# Patient Record
Sex: Male | Born: 1935 | Race: White | Hispanic: No | Smoking: Former smoker
Health system: Southern US, Community
[De-identification: ages and names within clinical notes are randomized; demographics above are authoritative.]

## PROBLEM LIST (undated history)

## (undated) DIAGNOSIS — Z85828 Personal history of other malignant neoplasm of skin: Secondary | ICD-10-CM

## (undated) DIAGNOSIS — Z860100 Personal history of colon polyps, unspecified: Secondary | ICD-10-CM

## (undated) DIAGNOSIS — M810 Age-related osteoporosis without current pathological fracture: Secondary | ICD-10-CM

## (undated) DIAGNOSIS — J4489 Other specified chronic obstructive pulmonary disease: Secondary | ICD-10-CM

## (undated) DIAGNOSIS — R0600 Dyspnea, unspecified: Secondary | ICD-10-CM

## (undated) DIAGNOSIS — G4700 Insomnia, unspecified: Secondary | ICD-10-CM

## (undated) DIAGNOSIS — I10 Essential (primary) hypertension: Secondary | ICD-10-CM

## (undated) DIAGNOSIS — J449 Chronic obstructive pulmonary disease, unspecified: Secondary | ICD-10-CM

## (undated) DIAGNOSIS — R351 Nocturia: Secondary | ICD-10-CM

## (undated) DIAGNOSIS — C61 Malignant neoplasm of prostate: Secondary | ICD-10-CM

## (undated) DIAGNOSIS — M1990 Unspecified osteoarthritis, unspecified site: Secondary | ICD-10-CM

## (undated) DIAGNOSIS — Z8719 Personal history of other diseases of the digestive system: Secondary | ICD-10-CM

## (undated) DIAGNOSIS — K219 Gastro-esophageal reflux disease without esophagitis: Secondary | ICD-10-CM

## (undated) DIAGNOSIS — J479 Bronchiectasis, uncomplicated: Secondary | ICD-10-CM

## (undated) HISTORY — PX: UPPER GASTROINTESTINAL ENDOSCOPY: SHX188

## (undated) HISTORY — DX: Insomnia, unspecified: G47.00

## (undated) HISTORY — PX: CATARACT EXTRACTION W/ INTRAOCULAR LENS  IMPLANT, BILATERAL: SHX1307

## (undated) HISTORY — DX: Age-related osteoporosis without current pathological fracture: M81.0

## (undated) HISTORY — PX: COLONOSCOPY: SHX174

---

## 2004-09-22 ENCOUNTER — Ambulatory Visit: Payer: Self-pay | Admitting: Internal Medicine

## 2005-03-20 ENCOUNTER — Ambulatory Visit: Payer: Self-pay | Admitting: Internal Medicine

## 2005-05-01 ENCOUNTER — Ambulatory Visit: Payer: Self-pay | Admitting: Internal Medicine

## 2005-12-22 ENCOUNTER — Ambulatory Visit: Payer: Self-pay | Admitting: Gastroenterology

## 2006-01-06 ENCOUNTER — Ambulatory Visit: Payer: Self-pay | Admitting: Gastroenterology

## 2006-01-06 ENCOUNTER — Encounter (INDEPENDENT_AMBULATORY_CARE_PROVIDER_SITE_OTHER): Payer: Self-pay | Admitting: Specialist

## 2006-01-06 ENCOUNTER — Ambulatory Visit (HOSPITAL_COMMUNITY): Admission: RE | Admit: 2006-01-06 | Discharge: 2006-01-06 | Payer: Self-pay | Admitting: Gastroenterology

## 2006-02-15 ENCOUNTER — Ambulatory Visit: Payer: Self-pay | Admitting: Gastroenterology

## 2006-03-31 ENCOUNTER — Ambulatory Visit: Payer: Self-pay | Admitting: Internal Medicine

## 2007-02-03 ENCOUNTER — Ambulatory Visit: Payer: Self-pay | Admitting: Gastroenterology

## 2007-04-13 ENCOUNTER — Ambulatory Visit: Payer: Self-pay | Admitting: Internal Medicine

## 2007-12-12 ENCOUNTER — Ambulatory Visit (HOSPITAL_COMMUNITY): Admission: RE | Admit: 2007-12-12 | Discharge: 2007-12-12 | Payer: Self-pay | Admitting: Urology

## 2007-12-14 ENCOUNTER — Ambulatory Visit: Admission: RE | Admit: 2007-12-14 | Discharge: 2008-03-13 | Payer: Self-pay | Admitting: Radiation Oncology

## 2008-01-06 ENCOUNTER — Ambulatory Visit (HOSPITAL_COMMUNITY): Admission: RE | Admit: 2008-01-06 | Discharge: 2008-01-06 | Payer: Self-pay | Admitting: Urology

## 2008-01-13 ENCOUNTER — Ambulatory Visit: Payer: Self-pay | Admitting: Internal Medicine

## 2008-01-13 ENCOUNTER — Telehealth (INDEPENDENT_AMBULATORY_CARE_PROVIDER_SITE_OTHER): Payer: Self-pay | Admitting: *Deleted

## 2008-01-13 DIAGNOSIS — G4700 Insomnia, unspecified: Secondary | ICD-10-CM

## 2008-01-13 DIAGNOSIS — J441 Chronic obstructive pulmonary disease with (acute) exacerbation: Secondary | ICD-10-CM | POA: Insufficient documentation

## 2008-01-13 DIAGNOSIS — J449 Chronic obstructive pulmonary disease, unspecified: Secondary | ICD-10-CM

## 2008-01-13 DIAGNOSIS — I10 Essential (primary) hypertension: Secondary | ICD-10-CM | POA: Insufficient documentation

## 2008-01-13 DIAGNOSIS — J45909 Unspecified asthma, uncomplicated: Secondary | ICD-10-CM | POA: Insufficient documentation

## 2008-03-21 ENCOUNTER — Ambulatory Visit: Admission: RE | Admit: 2008-03-21 | Discharge: 2008-05-29 | Payer: Self-pay | Admitting: Radiation Oncology

## 2008-04-12 ENCOUNTER — Ambulatory Visit: Payer: Self-pay | Admitting: Internal Medicine

## 2008-04-12 DIAGNOSIS — R0600 Dyspnea, unspecified: Secondary | ICD-10-CM | POA: Insufficient documentation

## 2008-04-12 DIAGNOSIS — R0602 Shortness of breath: Secondary | ICD-10-CM

## 2008-04-27 ENCOUNTER — Telehealth: Payer: Self-pay | Admitting: Internal Medicine

## 2009-02-13 ENCOUNTER — Encounter: Payer: Self-pay | Admitting: Internal Medicine

## 2009-03-20 DIAGNOSIS — Z860100 Personal history of colon polyps, unspecified: Secondary | ICD-10-CM | POA: Insufficient documentation

## 2009-03-20 DIAGNOSIS — Z9189 Other specified personal risk factors, not elsewhere classified: Secondary | ICD-10-CM | POA: Insufficient documentation

## 2009-03-20 DIAGNOSIS — R1013 Epigastric pain: Secondary | ICD-10-CM | POA: Insufficient documentation

## 2009-03-20 DIAGNOSIS — K2940 Chronic atrophic gastritis without bleeding: Secondary | ICD-10-CM | POA: Insufficient documentation

## 2009-03-20 DIAGNOSIS — K219 Gastro-esophageal reflux disease without esophagitis: Secondary | ICD-10-CM

## 2009-03-21 ENCOUNTER — Ambulatory Visit: Payer: Self-pay | Admitting: Gastroenterology

## 2009-04-12 ENCOUNTER — Ambulatory Visit: Payer: Self-pay | Admitting: Internal Medicine

## 2009-07-14 IMAGING — CR DG CHEST 2V
2 series · 2 of 2 positions shown · non-contrast
Comparison: 04/13/2007

CLINICAL DATA: Dyspnea.  COPD.  Chronic bronchitis.  Asthma.

CHEST - 2 VIEW

[view not recorded (1 of 2)]
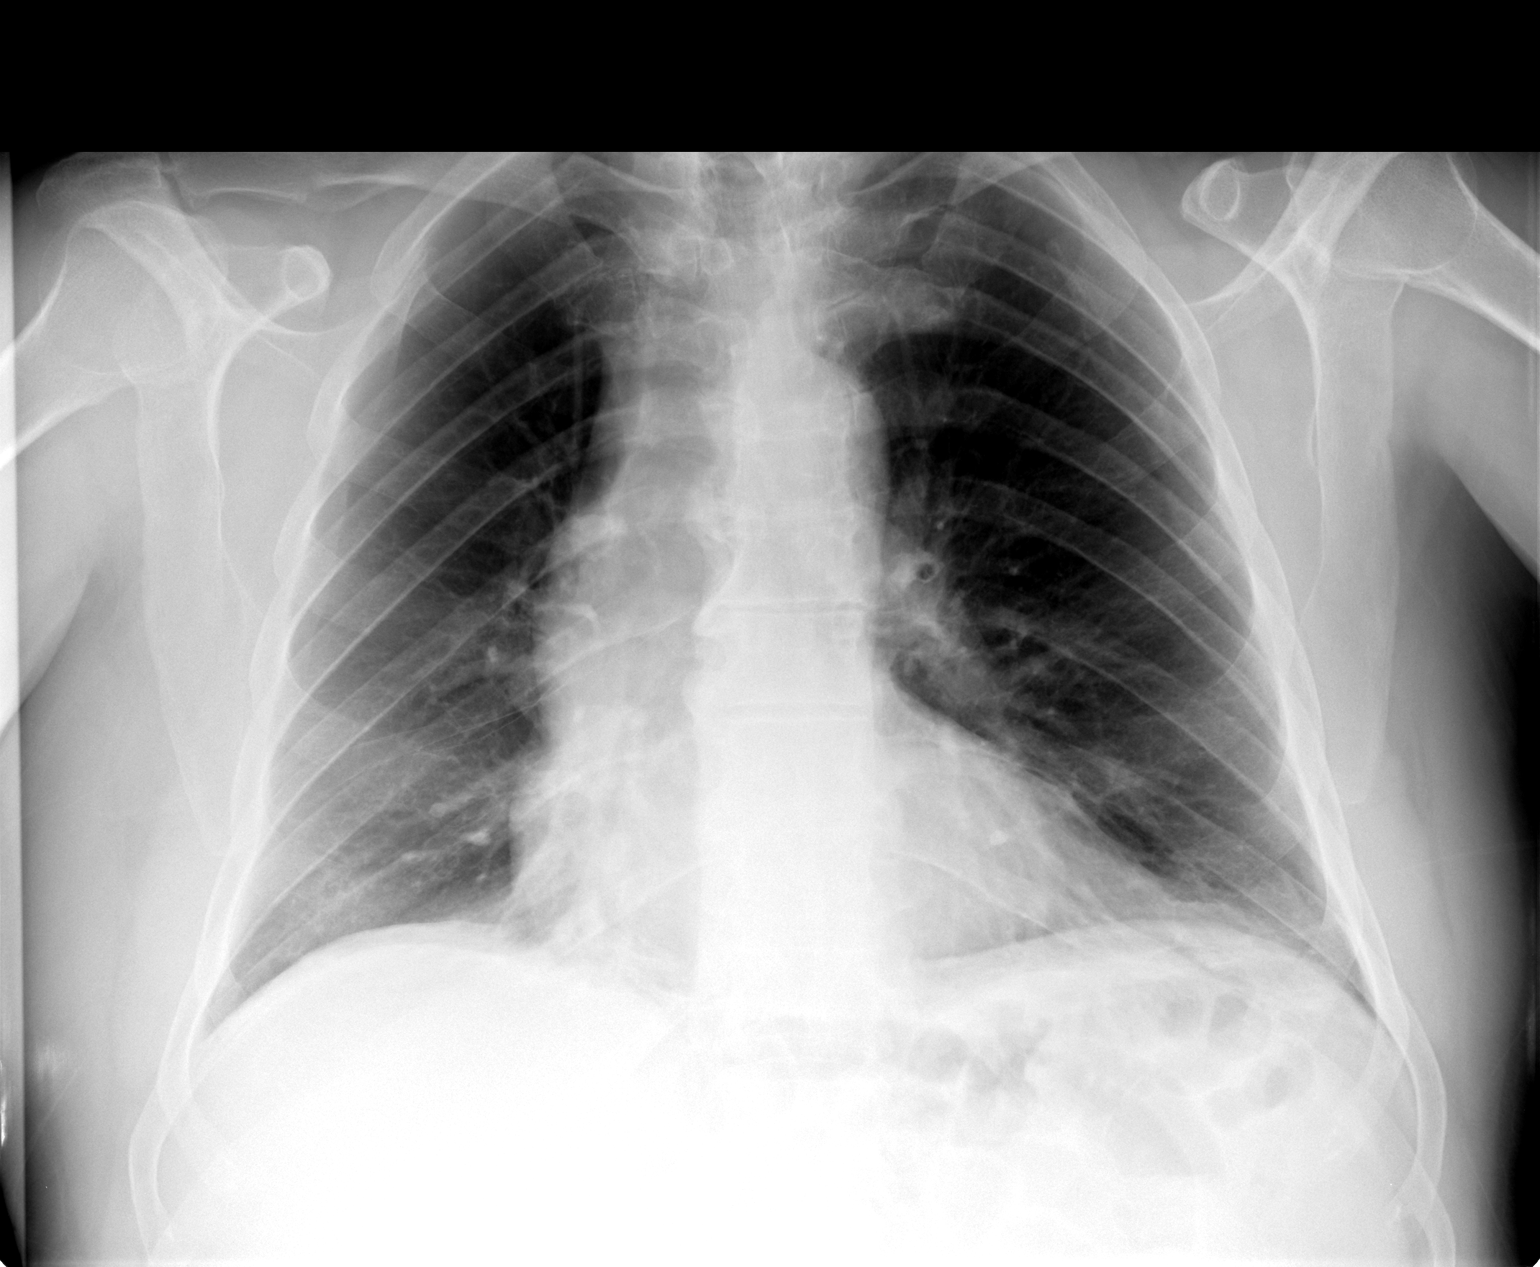

[view not recorded (2 of 2)]
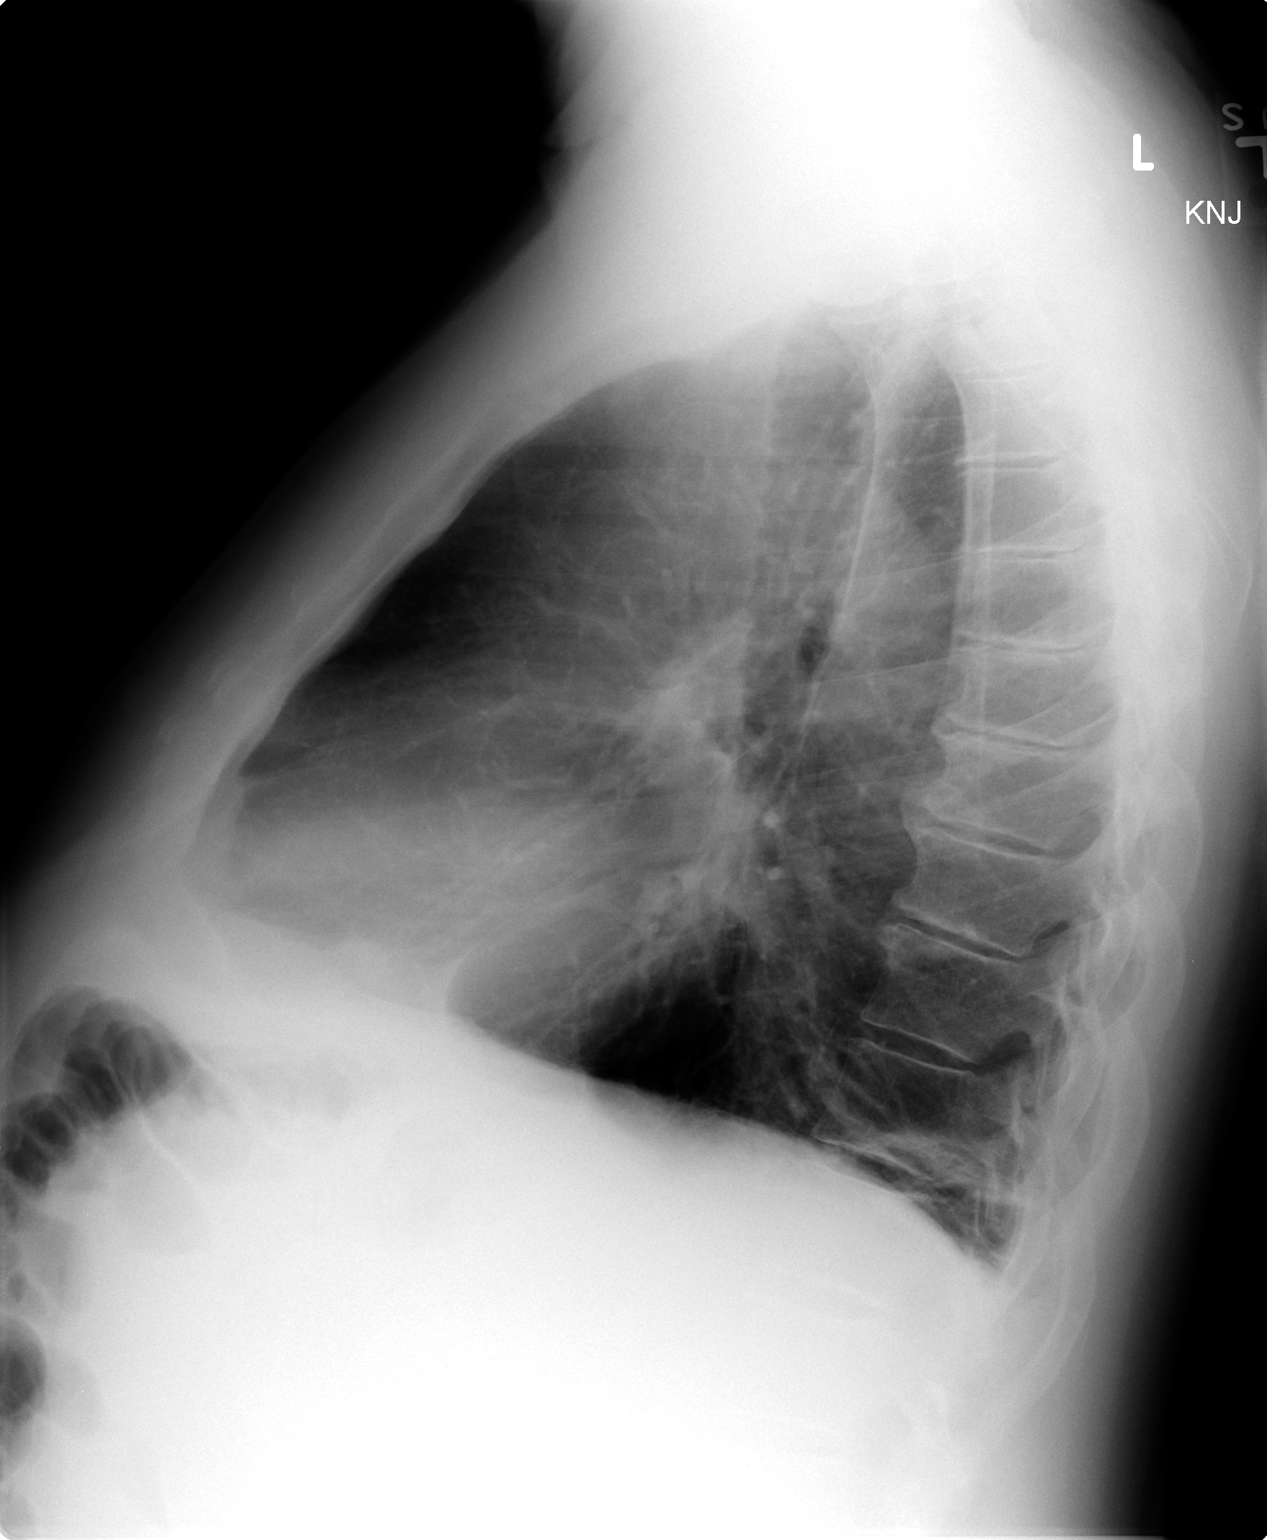

[2 of 2 positions shown; findings below may reference images not displayed]

FINDINGS: Lungs are hyperaerated.  No active infiltrate.  Linear
densities at the left lung base are again appreciated and are
compatible with scarring.  No airspace opacities.  Normal cardiac
size.
IMPRESSION: COPD/emphysema.  Left base scarring.  No acute findings.

## 2009-09-16 ENCOUNTER — Telehealth: Payer: Self-pay | Admitting: Internal Medicine

## 2010-04-11 ENCOUNTER — Ambulatory Visit: Payer: Self-pay | Admitting: Internal Medicine

## 2010-04-11 ENCOUNTER — Encounter: Payer: Self-pay | Admitting: Internal Medicine

## 2010-07-22 NOTE — Assessment & Plan Note (Signed)
Summary: rov 1 yr ///kp   Copy to:  Darvin Neighbours Primary Provider/Referring Provider:  Charm Barges, M.D.  CC:  1 Yearly follow up visit-COPD; Breathing better with cooler weather..  History of Present Illness:  04-24-2008- PFT reviewed: FEV1 2.36/80%, FEV1/FVC 0.66, 25-75 46%. Mild to mod obstructive disease with insignif resp to BD.Mild restriction of TLC 67%, NL DLCO. 93,96,96%, 417 M. Walks 2-4 miles/day. Cough white/yellow, no blood. Varies with weather, no trend. Denies purulent, blood, fever, chill, edema, adenopathy.  24-Apr-2009- Asthma/ COPD, Insomnia Breathing varies with weather but no major change since last here. [facility] Asmanaex. He asks to change from clonazepam for insomnia- tolerance now to 2-3  x 0.5 mg. discussed insomnia. Infrequent need for proventil rescue inhaler. PFT reviewed from last year.  April 11, 2010-  Asthma/ COPD, Insomnia [place] cc: 1 Yearly follow up visit-COPD; Breathing better with cooler weather. Disliked the summer humidity , better now with no acute issue recently, Dicsuccded flu vax.  Easier DOE in past year, a little worse but this has been present for years. [facility] told him "lungs were shot, it's not my heart". Daily cough productive white to light yellow, no blood. Denies chest pain. Some wheeze. Used rescue inhaler 2x daily during summer, now down to every 2-3 days.    Asthma History    Initial Asthma Severity Rating:    Age range: 12+ years    Symptoms: throughout the day    Nighttime Awakenings: 0-2/month    Interferes w/ normal activity: some limitations    SABA use (not for EIB): >2 days/week but not >1X/day    Asthma Severity Assessment: Severe Persistent   Preventive Screening-Counseling & Management  Alcohol-Tobacco     Smoking Status: quit     Packs/Day: 1.0     Year Quit: 1982  Current Medications (verified): 1)  Foradil Aerolizer 12 Mcg  Caps (Formoterol Fumarate) .... Inhale Contents of 1 Capsule Once A  Day 2)  Cozaar 100 Mg  Tabs (Losartan Potassium) .... One By Mouth Once Daily 3)  Clonazepam 0.5 Mg  Tabs (Clonazepam) .... Take 1 Tab By Mouth At Bedtime As Needed 4)  Hydrochlorothiazide 25 Mg  Tabs (Hydrochlorothiazide) .... Take 1 Tablet By Mouth Once A Day 5)  Cvs Omeprazole 20 Mg  Tbec (Omeprazole) .... Take 1 Tablet By Mouth Two Times A Day 6)  Proventil Hfa 108 (90 Base) Mcg/act  Aers (Albuterol Sulfate) .Marland Kitchen.. 1-2 Puffs Every 4 Hours As Needed 7)  Asmanex 60 Metered Doses 220 Mcg/inh Aepb (Mometasone Furoate) .... 2 Puffs and Rinse Every Day 8)  Fosamax 70 Mg Tabs (Alendronate Sodium) .... Once Weekly 9)  Oscal 500/200 D-3 500-200 Mg-Unit Tabs (Calcium-Vitamin D) .... Take 1 Tablet By Mouth Once A Day 10)  Multivitamins   Tabs (Multiple Vitamin) .... Take 1 Tablet By Mouth Once A Day 11)  Red Yeast Rice 600 Mg Caps (Red Yeast Rice Extract) .... Take 2 Tablet By Mouth Once A Day 12)  Aspirin 81 Mg  Tabs (Aspirin) .... 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  Veteran  Risk Factors: Smoking Status: quit (04/11/2010) Packs/Day: 1.0 (04/11/2010)  Past Surgical History: Skin cancer removal from left face  Social History: Packs/Day:  1.0  Review of Systems      See HPI       The patient complains of shortness of breath with activity, productive cough, and non-productive cough.  The patient denies shortness of breath at rest, coughing up blood, chest pain, irregular heartbeats, acid heartburn,  indigestion, loss of appetite, weight change, abdominal pain, difficulty swallowing, sore throat, tooth/dental problems, headaches, nasal congestion/difficulty breathing through nose, sneezing, itching, rash, change in color of mucus, and fever.    Vital Signs:  Patient profile:   75 year old male Height:      70 inches Weight:      188.50 pounds BMI:     27.14 O2 Sat:      96 % on Room air Pulse rate:   75 / minute BP sitting:   126 / 70  (left arm) Cuff size:   regular  Vitals Entered By: Reynaldo Minium CMA (April 11, 2010 9:52 AM)  O2 Flow:  Room air CC: 1 Yearly follow up visit-COPD; Breathing better with cooler weather.   Physical Exam  Additional Exam:  General: A/Ox3; pleasant and cooperative, NAD, overweight SKIN: no rash, lesions NODES: no lymphadenopathy HEENT: [place]/AT, EOM- WNL, Conjuctivae- clear, PERRLA, TM-WNL, Nose- clear, Throat- clear and wnl, Mallampati  II NECK: Supple w/ fair ROM, JVD- none, normal carotid impulses w/o bruits Thyroid- normal to palpation CHEST: Distant w/o wheeze or rhonchi HEART: RRR, no m/g/r heard ABDOMEN: overweight EAV:WUJW, nl pulses, no edema  NEURO: Grossly intact to observation      Impression & Recommendations:  Problem # 1:  COPD (ICD-496) Moderate COPD w/o acute issue. Due for colonoscopy and asks ok for concious sedation. Ok with caution. He will remind GI doctor at the time. I don't think we can do better with meds right now.   Problem # 2:  GASTROESOPHAGEAL REFLUX DISEASE, HX OF (ICD-V12.79) He is fairly well controlled. Does have GI doctor for ongoing care when needed.   Other Orders: Est. Patient Level IV (11914) T-2 View CXR (71020TC) Flu Vaccine 59yrs + MEDICARE PATIENTS (N8295) Administration Flu vaccine - MCR (A2130)  Patient Instructions: 1)  Please schedule a follow-up appointment in 1 year. 2)  Flu vax 3)  Remind your GI doctor at your colonoscopy that he needs to watch your breathing since you have  COPD. 4)  A chest x-ray has been recommended.  Your imaging study may require preauthorization.   Flu Vaccine Consent Questions     Do you have a history of severe allergic reactions to this vaccine? no    Any prior history of allergic reactions to egg and/or gelatin? no    Do you have a sensitivity to the preservative Thimersol? no    Do you have a past history of Guillan-Barre Syndrome? no    Do you currently have an acute febrile illness? no    Have you ever had a severe reaction to latex? no    Vaccine information given and explained to patient? yes    Are you currently pregnant? no    Lot Number:[id number]   Exp Date:12/20/2010   Site Given  Left Deltoid IMu1 Carron Curie CMA  April 11, 2010 10:47 AM

## 2010-07-22 NOTE — Miscellaneous (Signed)
Summary: changing *meds//jwr  Clinical Lists Changes  Medications: Changed medication from * OSCAL 500 MG PLUS D Take 1 tablet by mouth once a day to OSCAL 500/200 D-3 500-200 MG-UNIT TABS (CALCIUM-VITAMIN D) Take 1 tablet by mouth once a day Changed medication from * CENTRUM SILVER MULTIVITAMIN Take 1 tablet by mouth once a day to MULTIVITAMINS   TABS (MULTIPLE VITAMIN) Take 1 tablet by mouth once a day Changed medication from * RED YEAST RICE  1200 MG Take 1 tablet by mouth once a day to RED YEAST RICE 600 MG CAPS (RED YEAST RICE EXTRACT) Take 2 tablet by mouth once a day Changed medication from * ASA  81 MG Take 1 tablet by mouth once a day to ASPIRIN 81 MG  TABS (ASPIRIN) Take 1 tablet by mouth once a day

## 2010-07-22 NOTE — Progress Notes (Signed)
Summary: rx refill  Phone Note Call from Patient   Caller: Patient Call For: young Summary of Call: pt requests sleep meds refilled. says [facility] [place] pt # [id number] Initial call taken by: Tivis Ringer, CNA,  September 16, 2009 3:25 PM  Follow-up for Phone Call        per margaret, the rx request from the pharmacy was faxed and given to triage (by Dorann Lodge) after fax was received today (about 15 mins ago). Tivis Ringer, CNA  September 16, 2009 3:45 PM  gave refill request to CY to sign per TD. Carron Curie CMA  September 16, 2009 5:20 PM   Additional Follow-up for Phone Call Additional follow up Details #1::        Please let pt know that Rx was faxed last night to pharmacy.Reynaldo Minium CMA  September 17, 2009 8:50 AM   pt aware rx sent.Carron Curie CMA  September 17, 2009 8:58 AM     Prescriptions: CLONAZEPAM 0.5 MG  TABS (CLONAZEPAM) Take 1 tab by mouth at bedtime as needed  #30 x 5   Entered by:   Carron Curie CMA   Authorized by:   Waymon Budge MD   Signed by:   Carron Curie CMA on 09/17/2009   Method used:   Telephoned to ...       [facility] (retail)       [address]       [place], [place]  [address]       Ph: [phone]       Fax: [phone]   RxID:   [id number]

## 2010-11-04 NOTE — Assessment & Plan Note (Signed)
[facility]                             PULMONARY OFFICE NOTE   NAME:[name], [name]                        MRN:          [id number]  DATE:04/13/2007                            DOB:          [birth year]    PULMONARY FOLLOWUP   PROBLEM:  1. Asthma with COPD.  2. Hypertension.  3. Insomnia.   HISTORY:  A 1-year followup.  He notices persistent mild chest  congestion, often coughing up yellow or green, otherwise white mucus.  No blood.  No chest pain.  He thinks he may have gotten a little more  short of breath over the past year, especially with exertion.  He gets  much of his medication from the [place].  Currently, he is using Foradil, but  dropped off Pulmicort and has no steroid inhaler.  We discussed concerns  about long-term use of long-acting beta adrenergic drugs without a  steroid inhaler.   OBJECTIVE:  Weight 195 pounds, BP 142/54, pulse 70, room air saturation  95%.  Minimal rhonchi.  Heart sounds are regular without murmur.  Breathing is unlabored.  I find no adenopathy and no edema.   IMPRESSION:  Chronic obstructive pulmonary disease with chronic  bronchitis.   PLAN:  1. Flu vaccine discussed and given.  2. Chest x-ray.  3. Restart Pulmicort Flexhaler 2 puffs b.i.d., which I think is      currently on the [place] formulary.  Schedule return in 1 year, earlier      p.r.n.     Clinton D. Maple Hudson, MD, Tonny Bollman, FACP  Electronically Signed    CDY/MedQ  DD: 04/13/2007  DT: 04/14/2007  Job #: [id number]   cc:   Samuel Jester

## 2010-11-04 NOTE — Assessment & Plan Note (Signed)
NAMEEARSEL, SHOUSE                  CHART#:  [id number]   DATE:  02/03/2007                       DOB:  1936/05/29   PROBLEM LIST:  1. Adenomatous colon polyp.  2. Epigastric and chest pain likely secondary to NSAID gastritis.  3. Hypertension.  4. Skin cancer.   SUBJECTIVE:  Mr. Urbas is a 75 year old male who presents as a return  patient visit.  He has no particular questions, concerns or complaints.  He denies any problems swallowing, nausea, vomiting.  He has had  intentional 6 to 8 pound weight loss.  He continues to take his  Omeprazole once daily.  He gets his Omeprazole through the [place].   OBJECTIVE:  VITAL SIGNS:  Weight 196 pounds, height 5 feet 10 inches,  temperature is 98.2, blood pressure 158/80, pulse 78.  GENERAL:  He is in no apparent distress, alert and oriented times 4.  LUNGS:  Clear to auscultation bilaterally.  CARDIOVASCULAR:  Regular rhythm.  No murmur.  ABDOMEN:  Bowel sounds present, soft, nontender, nondistended.   ASSESSMENT:  Mr. Spraggins is a 75 year old male with nonsteroidal  antiinflammatory drug gastritis.  His symptoms have resolved. Thank you  for allowing me to see Mr. Heady in consultation.  My recommendations  are as follows.   RECOMMENDATIONS:  1. He may continue the Omeprazole.  2. He is doing so well he may return to see me in 2010.  3. Screening colonoscopy in 2012.       Kassie Mends, M.D.  Electronically Signed     SM/MEDQ  D:  02/03/2007  T:  02/04/2007  Job:  [id number]   cc:   Samuel Jester

## 2010-11-07 NOTE — Consult Note (Signed)
NAMEGAIUS, ISHAQ NO.:  [id number]   MEDICAL RECORD NO.:  [id number]           PATIENT TYPE:   LOCATION:                                 FACILITY:   PHYSICIAN:  Kassie Mends, M.D.           DATE OF BIRTH:   DATE OF CONSULTATION:  DATE OF DISCHARGE:                                   CONSULTATION   Dear Dr. Charm Barges:   I am seeing [name] as a new patient in consultation per your request.  I  am seeing him for uncontrolled reflux disease.   [name] is a 75 year old male who has had acid problems for 20-30 years.  He has a burning sensation in his upper abdomen that radiates into his chest  daily.  His symptoms have been uncontrolled since September 2006.  It  started with a lot of gas and burping.  He says his throat stays raw.  He  has occasional pain in his lower abdomen and it is relieved by belching and  bowel movements.  His last episode was Friday.  He reports only eating 1  meal a day.  He denies any soda consumption, chewing gum, or use of  cigarettes.  He occasionally has difficulty swallowing water.  He has no  change in his voice.  He denies any weight loss, constipation, or blood in  his stool.  He occasionally uses Advil.  He does consume alcohol.  He has no  triggers to his burning sensation in his abdomen and chest.  It is unrelated  to foods.  He say eating initially makes it better and then after he  finishes digesting, it is worse.  He has had no significant improvement with  different proton pump inhibitors such as Prevacid and Protonix.  He has  never had a colonoscopy, and he denies nausea and vomiting.  He does not  have any diarrhea.  He says the symptoms are somewhat better but he is not  sure whether the omeprazole is the reason why.   His past medical history includes:  1.  Chronic.  2.  Hypertension.  3.  He has had a skin cancer removed from his face.   He is not allergic to any medicines.  His medication list includes  Losartan,  omeprazole, levalbuterol, clonazepam, Centrum, Silver, red yeast rice,  garlic, and Advil.  He has no family history of colon cancer or colon polyps.  His mother did  have intestinal gangrene at age 2 and died.  He is not married and stopped  smoking in 1982.  His review of systems is per the HPI, otherwise all systems are negative.   On physical exam, he is afebrile and hemodynamically stable. GENERAL: NAD, A  & O X 4. heent: AT/[place], PERRL, mouth no oral lesions, posterior pharynx  without erythema or exudate. NECK: no lymphadenopathy, FROM. LUNGS: clear to  auscultation. CARDIOVASCULAR: RR, no mumur. ABDOMEN: bowel sounds present,  no TTP, rebound, nor guarding, no hepatosplenomegaly. EXTREMITIES: no  cyanosis, clubbling, or edema. NEURO: no focal deificts.  [name] is a 75 year old male with uncontrolled reflux.  He has lower  abdominal pain and is average risk for colorectal cancer occurrence.  The  differential diagnoses for the burnig sensation in his abdomen includes:  reflux esophagitis, nonerosive reflux esophageal disease, gastritis from  alcohol and NSAIDs, peptic ulcer disease, and a low likelihood of  malignancy.  His lower abdominal pain is not problematic at this time.  Thank you for allowing me to see [name] in consultation.   My list of recommendations follows:  1.  I recommend upper endoscopy to evaluate persistent burning pain in his      chest and abdomen.  I will biopsy any abnormal areas.  2.  He will also have a colonoscopy performed for average risk colon cancer      screening.  3.  He may follow up with me as needed.   Please feel free to contact me at [phone] if you have additional  questions.   Sincerely,      Kassie Mends, M.D.  Electronically Signed     SM/MEDQ  D:  12/22/2005  T:  12/22/2005  Job:  [id number]

## 2010-11-07 NOTE — Op Note (Signed)
[name], [name]                 ACCOUNT NO.:  [id number]   MEDICAL RECORD NO.:  [id number]          PATIENT TYPE:  AMB   LOCATION:  DAY                           FACILITY:  [facility]   PHYSICIAN:  Kassie Mends, M.D.      DATE OF BIRTH:  05/14/1936   DATE OF PROCEDURE:  01/06/2006  DATE OF DISCHARGE:                                 OPERATIVE REPORT   PROCEDURE:  Esophagogastroduodenoscopy with cold forceps biopsy.   MEDICATIONS:  1.  Demerol 50 mg IV.  2.  Versed 4 mg IV.   INDICATIONS FOR EXAM:  [name] is a 75 year old male who presented with  acid problems for 20 to 30 years.  He had a burning sensation in his upper  abdomen.  He believes this is his reflux disease.   FINDINGS:  1.  Normal esophagus without evidence of reflux esophagitis or Barrett's      esophagus.  2.  Mild antral erythema.  3.  Otherwise normal pylorus, duodenal bulb, and duodenum.   RECOMMENDATIONS:  1.  No aspirin, NSAIDs or anticoagulation for three days.  2.  Will follow up biopsies.  3.  No source identified for burning sensation in abdomen, which is likely      due to nonulcer dyspepsia or nonerosive reflux esophageal disease.  I      will await the results of the biopsies and decide what the next best      step will be.  4.  Follow up with Dr. Samuel Jester.   PROCEDURE TECHNIQUE:  A physical exam was performed and informed consent was  obtained from the patient after explaining all risks (perforation, bleeding,  infection, adverse effects to medicines), benefits and alternatives to the  procedure which the patient appeared to understand and so stated.  The  patient was connected to the monitoring device and placed in the left  lateral position.  Continuous oxygen was provided by nasal cannula and IV  medicine administered through an indwelling cannula.  After administration  of sedation, the patient's esophagus was intubated  and the scope was advanced under direct visualization to the second  portion  of the duodenum.  The scope was subsequently removed slowly by carefully  examining the color, texture, anatomy, and mucosa on the way out.  The  patient was recovered in the endoscopy suite and discharged to home in  satisfactory condition.      Kassie Mends, M.D.  Electronically Signed     SM/MEDQ  D:  01/06/2006  T:  01/07/2006  Job:  [id number]   cc:   Samuel Jester  Fax: [phone]

## 2010-11-07 NOTE — Op Note (Signed)
[name], HY                 ACCOUNT NO.:  [id number]   MEDICAL RECORD NO.:  [id number]          PATIENT TYPE:  AMB   LOCATION:  DAY                           FACILITY:  [facility]   PHYSICIAN:  Kassie Mends, M.D.      DATE OF BIRTH:  03-10-1936   DATE OF PROCEDURE:  01/06/2006  DATE OF DISCHARGE:                                 OPERATIVE REPORT   PROCEDURE:  Colonoscopy with snare cautery and cold forceps biopsy.   MEDICATIONS:  1.  Demerol 25 mg IV.  2.  Versed 1 mg IV.   INDICATIONS FOR EXAM:  Mr. [name] is a 75 year old male who presents for  average-risk colon cancer screening.   FINDINGS:  1.  Multiple colon and rectal polyps removed via cold forceps biopsy, cold      snare, and snare cautery.  2.  Sigmoid diverticulosis  3.  Otherwise no inflammatory changes, masses, or vascular ectasia seen.  No      internal hemorrhoids.   RECOMMENDATIONS:  1.  No aspirin, NSAIDs, or anticoagulation for 5-7 days.  2.  Will follow up biopsies.  3.  If polyps are adenomatous, he will need a screening colonoscopy in 5      years.  4.  Follow up with Dr. Samuel Jester.   PROCEDURE TECHNIQUE:  A physical exam was performed and an informed consent  was obtained from the patient after explaining all risks, benefits, and  alternatives to the procedure which the patient appeared to understand and  so stated.  The patient was connected to the monitoring device and placed in  the left lateral position.  Continuous oxygen was provided by nasal cannula  and IV medicines administered through an indwelling cannula.  After  administration of sedation and rectal exam, the patient's rectum was  intubated; and the scope was advanced, under direct visualization, to the  cecum.  Multiple 4-to-6-mm sessile polyps in the colon and the rectum were  removed using cold biopsy technique and cautery with the Erbie at standard  settings (25W).  The scope was subsequently removed slowly by carefully  examining  the color, texture, and anatomy of the mucosa on the way out.  The  patient was recovered in the endoscopy suite and discharged to home in  satisfactory condition.      Kassie Mends, M.D.  Electronically Signed     SM/MEDQ  D:  01/06/2006  T:  01/07/2006  Job:  [id number]   cc:   Samuel Jester  Fax: [phone]

## 2010-12-25 ENCOUNTER — Encounter: Payer: Self-pay | Admitting: Gastroenterology

## 2011-02-05 ENCOUNTER — Ambulatory Visit: Payer: Self-pay | Admitting: Gastroenterology

## 2011-02-25 ENCOUNTER — Encounter: Payer: Self-pay | Admitting: Gastroenterology

## 2011-02-25 ENCOUNTER — Ambulatory Visit (INDEPENDENT_AMBULATORY_CARE_PROVIDER_SITE_OTHER): Payer: Medicare Other | Admitting: Gastroenterology

## 2011-02-25 VITALS — BP 113/61 | HR 94 | Temp 97.6°F | Ht 69.0 in | Wt 184.2 lb

## 2011-02-25 DIAGNOSIS — D126 Benign neoplasm of colon, unspecified: Secondary | ICD-10-CM

## 2011-02-25 DIAGNOSIS — K635 Polyp of colon: Secondary | ICD-10-CM

## 2011-02-25 MED ORDER — ALENDRONATE SODIUM 70 MG PO TABS: 70.0000 mg | ORAL_TABLET | ORAL | Status: DC

## 2011-02-25 NOTE — Progress Notes (Signed)
Subjective:    Patient ID: [name], male    DOB: 23-Nov-1935, 75 y.o.   MRN: [id number]  [facility] [facility] [place] ([place]. Valene Bors)  HPI Having problems with sore throat/PND. Problems being put to sleep due to chronic bronchitis. Treated to Prostate CA-took XRT because he couldn't have surgery. No problems with sedation. No BRBPR,  or melena. Rare problem with swallowing-w/u at [facility] in 1984. Loss weight due to sore throat. 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 ON SYMMBICORT SINCE MAY 2012 AND ON/OFF STEROIDS SINCE 1984. Calcium 2 daily and VIT D ONCE A WEEK, AND FOSAMAX. APPETITE: SO SO.  Past Medical History  Diagnosis Date  . Insomnia   . HTN (hypertension)   . COPD (chronic obstructive pulmonary disease)   . Asthma   . Prostate ca   . Skin cancer of face   . NSAID-associated gastropathy 2007 ADVIL    Past Surgical History  Procedure Date  . Esophagogastroduodenoscopy 12/2005  . Skin cancer excision     from left  face  . Upper gastrointestinal endoscopy 2007 DYSPEPSIA D50 V4    NSAID GASTRITIS  . Colonoscopy JUL 2007 D25 V1    Simple adenomas(RECTUM), HYPERPLASTIC COLON POLYPS, [place] TICS    No Known Allergies  Current Outpatient Prescriptions  Medication Sig Dispense Refill  . albuterol (PROVENTIL HFA;VENTOLIN HFA) 108 (90 BASE) MCG/ACT inhaler Inhale 2 puffs into the lungs every 6 (six) hours as needed.        Marland Kitchen aspirin 81 MG tablet Take 81 mg by mouth daily.        . budesonide-formoterol (SYMBICORT) 160-4.5 MCG/ACT inhaler Inhale 2 puffs into the lungs 2 (two) times daily.        . calcium-vitamin D (OSCAL WITH D) 500-200 MG-UNIT per tablet Take 1 tablet by mouth daily.        Marland Kitchen doxazosin (CARDURA) 4 MG tablet Take 4 mg by mouth at bedtime.       . ergocalciferol (VITAMIN D2) 50000 UNITS capsule Take 50,000 Units by mouth once a week.        . hydrochlorothiazide 25 MG tablet Take 25 mg by mouth daily.        Marland Kitchen losartan  (COZAAR) 100 MG tablet Take 100 mg by mouth daily.        . mometasone (ASMANEX) 220 MCG/INH inhaler Inhale 2 puffs into the lungs daily.        . Multiple Vitamin (MULTIVITAMIN) capsule Take 1 capsule by mouth daily.        Marland Kitchen omeprazole (PRILOSEC) 20 MG capsule Take 20 mg by mouth daily.        . Red Yeast Rice 600 MG CAPS Take by mouth.        . zolpidem (AMBIEN) 10 MG tablet Take 10 mg by mouth at bedtime as needed.        Marland Kitchen alendronate (FOSAMAX) 70 MG tablet Take 1 tablet (70 mg total) by mouth every 7 (seven) days. Take with a full glass of water on an empty stomach.  4 tablet  5    Family History  Problem Relation Age of Onset  . Prostate cancer Father   . Colon cancer Neg Hx   . Colon polyps Neg Hx     History   Social History  . Marital Status: Divorced   . Smoking status: Former Smoker -- 1.5 packs/day    Types: Cigarettes  . Smokeless tobacco: Not  on file   Comment: quit about 30 yrs ago( 1982)  . Alcohol Use: Yes     former user   Review of Systems  All other systems reviewed and are negative.       Objective:   Physical Exam  Constitutional: He is oriented to person, place, and time. He appears well-developed and well-nourished. No distress.  HENT:  Head: Normocephalic and atraumatic.  Mouth/Throat: Oropharynx is clear and moist.  Eyes: Pupils are equal, round, and reactive to light.  Neck: Normal range of motion. Neck supple.  Cardiovascular: Normal rate, regular rhythm and normal heart sounds.   Pulmonary/Chest: Effort normal and breath sounds normal.  Abdominal: Bowel sounds are normal. He exhibits no distension. There is no tenderness.  Musculoskeletal: Normal range of motion. He exhibits no edema.  Lymphadenopathy:    He has no cervical adenopathy.  Neurological: He is alert and oriented to person, place, and time.       NO FOCAL DEFICITS  Psychiatric: He has a normal mood and affect.          Assessment & Plan:

## 2011-02-26 ENCOUNTER — Encounter: Payer: Self-pay | Admitting: Gastroenterology

## 2011-02-26 DIAGNOSIS — K635 Polyp of colon: Secondary | ICD-10-CM | POA: Insufficient documentation

## 2011-02-26 NOTE — Progress Notes (Signed)
Cc to PCP 

## 2011-02-26 NOTE — Assessment & Plan Note (Signed)
TCS IN 2013. IF SIMPLE ADENOMAS, then will extend TCS interval to 10-15 years. MOVIPREP. DO NOT TAKE FOSAMAX ON THE DAY PRIOR TO OR DAY OF TCS. OPV PRN.

## 2011-03-06 NOTE — Progress Notes (Signed)
TCS REMINDER PLACED IN EPIC

## 2011-03-18 ENCOUNTER — Encounter (HOSPITAL_COMMUNITY)
Admission: RE | Disposition: A | Discharge status: Home / Self Care | Payer: Self-pay | Source: Ambulatory Visit | Attending: Gastroenterology

## 2011-03-18 ENCOUNTER — Ambulatory Visit (HOSPITAL_COMMUNITY)
Admission: RE | Admit: 2011-03-18 | Discharge: 2011-03-18 | Disposition: A | Discharge status: Home / Self Care | Payer: Medicare Other | Source: Ambulatory Visit | Attending: Gastroenterology | Admitting: Gastroenterology

## 2011-03-18 ENCOUNTER — Encounter (HOSPITAL_COMMUNITY): Payer: Self-pay | Admitting: *Deleted

## 2011-03-18 ENCOUNTER — Other Ambulatory Visit: Payer: Self-pay | Admitting: Gastroenterology

## 2011-03-18 DIAGNOSIS — Z860100 Personal history of colon polyps, unspecified: Secondary | ICD-10-CM | POA: Insufficient documentation

## 2011-03-18 DIAGNOSIS — Z7982 Long term (current) use of aspirin: Secondary | ICD-10-CM | POA: Insufficient documentation

## 2011-03-18 DIAGNOSIS — K635 Polyp of colon: Secondary | ICD-10-CM

## 2011-03-18 DIAGNOSIS — I10 Essential (primary) hypertension: Secondary | ICD-10-CM | POA: Insufficient documentation

## 2011-03-18 DIAGNOSIS — D126 Benign neoplasm of colon, unspecified: Secondary | ICD-10-CM | POA: Insufficient documentation

## 2011-03-18 DIAGNOSIS — J449 Chronic obstructive pulmonary disease, unspecified: Secondary | ICD-10-CM | POA: Insufficient documentation

## 2011-03-18 DIAGNOSIS — K648 Other hemorrhoids: Secondary | ICD-10-CM | POA: Insufficient documentation

## 2011-03-18 DIAGNOSIS — J4489 Other specified chronic obstructive pulmonary disease: Secondary | ICD-10-CM | POA: Insufficient documentation

## 2011-03-18 DIAGNOSIS — K5730 Diverticulosis of large intestine without perforation or abscess without bleeding: Secondary | ICD-10-CM | POA: Insufficient documentation

## 2011-03-18 DIAGNOSIS — Z1211 Encounter for screening for malignant neoplasm of colon: Secondary | ICD-10-CM

## 2011-03-18 DIAGNOSIS — Z09 Encounter for follow-up examination after completed treatment for conditions other than malignant neoplasm: Secondary | ICD-10-CM | POA: Insufficient documentation

## 2011-03-18 DIAGNOSIS — Z79899 Other long term (current) drug therapy: Secondary | ICD-10-CM | POA: Insufficient documentation

## 2011-03-18 HISTORY — PX: COLONOSCOPY: SHX5424

## 2011-03-18 SURGERY — COLONOSCOPY
Anesthesia: Moderate Sedation

## 2011-03-18 MED ORDER — SODIUM CHLORIDE 0.45 % IV SOLN
Freq: Once | INTRAVENOUS | Status: AC
  Administered 2011-03-18: 08:00:00 via INTRAVENOUS

## 2011-03-18 MED ORDER — STERILE WATER FOR IRRIGATION IR SOLN
Status: DC | PRN
  Administered 2011-03-18: 08:00:00

## 2011-03-18 MED ORDER — MEPERIDINE HCL 100 MG/ML IJ SOLN
INTRAMUSCULAR | Status: DC | PRN
  Administered 2011-03-18: 25 mg via INTRAVENOUS

## 2011-03-18 MED ORDER — MIDAZOLAM HCL 5 MG/5ML IJ SOLN
INTRAMUSCULAR | Status: AC
  Filled 2011-03-18: qty 5

## 2011-03-18 MED ORDER — MEPERIDINE HCL 100 MG/ML IJ SOLN
INTRAMUSCULAR | Status: AC
  Filled 2011-03-18: qty 1

## 2011-03-18 MED ORDER — MIDAZOLAM HCL 5 MG/5ML IJ SOLN
INTRAMUSCULAR | Status: DC | PRN
  Administered 2011-03-18: 2 mg via INTRAVENOUS

## 2011-03-18 NOTE — Interval H&P Note (Signed)
History and Physical Interval Note:   03/18/2011   8:11 AM   [name]  has presented today for surgery, with the diagnosis of colon polyps  The various methods of treatment have been discussed with the patient and family. After consideration of risks, benefits and other options for treatment, the patient has consented to  Procedure(s): COLONOSCOPY as a surgical intervention .  I have reviewed the patients' chart and labs.  Questions were answered to the patient's satisfaction.     Jonette Eva  MD

## 2011-03-18 NOTE — H&P (Signed)
[name], male    DOB: 1935-09-15, 75 y.o.   MRN: [id number]   [facility] [place]. Valene Bors[place]   HPI Having problems with sore throat/PND. Problems being put to sleep due to chronic bronchitis. Treated to Prostate CA-took XRT because he couldn't have surgery. No problems with sedation. No BRBPR,  or melena. Rare problem with swallowing-w/u at [facility] in 1984. Loss weight due to sore throat. 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 ON SYMMBICORT SINCE MAY 2012 AND ON/OFF STEROIDS SINCE 1984. Calcium 2 daily and VIT D ONCE A WEEK, AND FOSAMAX. APPETITE: SO SO.    Past Medical History   Diagnosis  Date   .  Insomnia     .  HTN (hypertension)     .  COPD (chronic obstructive pulmonary disease)     .  Asthma     .  Prostate ca     .  Skin cancer of face     .  NSAID-associated gastropathy  2007 AD[facility]       Past Surgical History   Procedure  Date   .  Esophagogastroduodenoscopy  12/2005   .  Skin cancer excision         from left  face   .  Upper gastrointestinal endoscopy  2007 DYSPEPSIA D50 V4       NSAID GASTRITIS   .  Colonoscopy  JUL 2007 D25 V1       Simple adenomas(RECTUM), HYPERPLASTIC COLON POLYPS, [place] TICS      No Known Allergies    Current Outpatient Prescriptions   Medication  Sig  Dispense  Refill   .  albuterol (PROVENTIL HFA;VENTOLIN HFA) 108 (90 BASE) MCG/ACT inhaler  Inhale 2 puffs into the lungs every 6 (six) hours as needed.           Marland Kitchen  aspirin 81 MG tablet  Take 81 mg by mouth daily.           .  budesonide-formoterol (SYMBICORT) 160-4.5 MCG/ACT inhaler  Inhale 2 puffs into the lungs 2 (two) times daily.           .  calcium-vitamin D (OSCAL WITH D) 500-200 MG-UNIT per tablet  Take 1 tablet by mouth daily.           Marland Kitchen  doxazosin (CARDURA) 4 MG tablet  Take 4 mg by mouth at bedtime.          .  ergocalciferol (VITAMIN D2) 50000 UNITS capsule  Take 50,000 Units by mouth once a week.           .   hydrochlorothiazide 25 MG tablet  Take 25 mg by mouth daily.           Marland Kitchen  losartan (COZAAR) 100 MG tablet  Take 100 mg by mouth daily.           .  mometasone (ASMANEX) 220 MCG/INH inhaler  Inhale 2 puffs into the lungs daily.           .  Multiple Vitamin (MULTIVITAMIN) capsule  Take 1 capsule by mouth daily.           Marland Kitchen  omeprazole (PRILOSEC) 20 MG capsule  Take 20 mg by mouth daily.           .  Red Yeast Rice 600 MG CAPS  Take by mouth.           .  zolpidem (  AMBIEN) 10 MG tablet  Take 10 mg by mouth at bedtime as needed.           Marland Kitchen  alendronate (FOSAMAX) 70 MG tablet  Take 1 tablet (70 mg total) by mouth every 7 (seven) days. Take with a full glass of water on an empty stomach.   4 tablet   5       Family History   Problem  Relation  Age of Onset   .  Prostate cancer  Father     .  Colon cancer  Neg Hx     .  Colon polyps  Neg Hx         History       Social History   .  Marital Status:  Divorced       .  Smoking status:  Former Smoker -- 1.5 packs/day       Types:  Cigarettes   .  Smokeless tobacco:  Not on file     Comment: quit about 30 yrs ago( 1982)   .  Alcohol Use:  Yes         former user      Review of Systems  All other systems reviewed and are negative.     Objective:    Physical Exam  Constitutional: He is oriented to person, place, and time. He appears well-developed and well-nourished. No distress.  HENT:   Head: Normocephalic and atraumatic.   Mouth/Throat: Oropharynx is clear and moist.  Eyes: Pupils are equal, round, and reactive to light.  Neck: Normal range of motion. Neck supple.  Cardiovascular: Normal rate, regular rhythm and normal heart sounds.   Pulmonary/Chest: Effort normal and breath sounds normal.  Abdominal: Bowel sounds are normal. He exhibits no distension. There is no tenderness.  Musculoskeletal: Normal range of motion. He exhibits no edema.  Lymphadenopathy:    He has no cervical adenopathy.  Neurological: He is alert and  oriented to person, place, and time.       NO FOCAL DEFICITS  Psychiatric: He has a normal mood and affect.   Assessment & Plan:     Colon polyps - Jonette Eva, MD  02/26/2011 10:57 AM  Signed TCS IN 2012. IF SIMPLE ADENOMAS, then will extend TCS interval to 10-15 years. MOVIPREP. DO NOT TAKE FOSAMAX ON THE DAY PRIOR TO OR DAY OF TCS. OPV PRN.

## 2011-03-20 LAB — CBC
HCT: 44.4
MCHC: 34.1
MCV: 94.6
RBC: 4.7

## 2011-03-20 LAB — COMPREHENSIVE METABOLIC PANEL
BUN: 12
CO2: 32
Calcium: 9.9
Creatinine, Ser: 0.97
GFR calc Af Amer: >60
GFR calc non Af Amer: >60
Glucose, Bld: 98
Total Bilirubin: 1.2

## 2011-03-20 LAB — PROTIME-INR
INR: 1
Prothrombin Time: 13.1

## 2011-03-20 LAB — APTT: aPTT: 29

## 2011-03-25 ENCOUNTER — Encounter (HOSPITAL_COMMUNITY): Payer: Self-pay | Admitting: Gastroenterology

## 2011-03-25 ENCOUNTER — Telehealth: Payer: Self-pay | Admitting: Gastroenterology

## 2011-03-25 NOTE — Telephone Encounter (Signed)
Please call pt. He had A simple adenoma removed from her colon. TCS in 10-15 years if the benefits outweigh the risks. High fiber diet.

## 2011-03-26 NOTE — Telephone Encounter (Signed)
Results Cc to PCP and 10-15 yr reminder is nicd in the computer

## 2011-03-26 NOTE — Telephone Encounter (Signed)
Reminder in epic to have tcs in 10-15 years °

## 2011-03-26 NOTE — Telephone Encounter (Signed)
LMOM to call.

## 2011-03-26 NOTE — Telephone Encounter (Signed)
Pt informed of results.

## 2011-04-09 ENCOUNTER — Ambulatory Visit (INDEPENDENT_AMBULATORY_CARE_PROVIDER_SITE_OTHER): Payer: Medicare Other | Admitting: Internal Medicine

## 2011-04-09 ENCOUNTER — Encounter: Payer: Self-pay | Admitting: Internal Medicine

## 2011-04-09 ENCOUNTER — Ambulatory Visit (INDEPENDENT_AMBULATORY_CARE_PROVIDER_SITE_OTHER)
Admission: RE | Admit: 2011-04-09 | Discharge: 2011-04-09 | Disposition: A | Discharge status: Home / Self Care | Payer: Medicare Other | Source: Ambulatory Visit | Attending: Internal Medicine | Admitting: Internal Medicine

## 2011-04-09 ENCOUNTER — Other Ambulatory Visit (INDEPENDENT_AMBULATORY_CARE_PROVIDER_SITE_OTHER): Payer: Medicare Other

## 2011-04-09 VITALS — BP 132/70 | HR 92 | Ht 70.0 in | Wt 182.4 lb

## 2011-04-09 DIAGNOSIS — IMO0001 Reserved for inherently not codable concepts without codable children: Secondary | ICD-10-CM

## 2011-04-09 DIAGNOSIS — J4489 Other specified chronic obstructive pulmonary disease: Secondary | ICD-10-CM

## 2011-04-09 DIAGNOSIS — J449 Chronic obstructive pulmonary disease, unspecified: Secondary | ICD-10-CM

## 2011-04-09 DIAGNOSIS — J00 Acute nasopharyngitis [common cold]: Secondary | ICD-10-CM

## 2011-04-09 LAB — CBC WITH DIFFERENTIAL/PLATELET
Basophils Absolute: 0 10*3/uL (ref 0.0–0.1)
Eosinophils Relative: 2.6 % (ref 0.0–5.0)
Monocytes Relative: 6.9 % (ref 3.0–12.0)
Neutrophils Relative %: 77.2 % — ABNORMAL HIGH (ref 43.0–77.0)
Platelets: 210 10*3/uL (ref 150.0–400.0)
RDW: 13.1 % (ref 11.5–14.6)
WBC: 8.5 10*3/uL (ref 4.5–10.5)

## 2011-04-09 MED ORDER — ALBUTEROL SULFATE (2.5 MG/3ML) 0.083% IN NEBU
2.5000 mg | INHALATION_SOLUTION | Freq: Once | RESPIRATORY_TRACT | Status: AC
  Administered 2011-04-09: 2.5 mg via RESPIRATORY_TRACT

## 2011-04-09 MED ORDER — METHYLPREDNISOLONE ACETATE 80 MG/ML IJ SUSP
80.0000 mg | Freq: Once | INTRAMUSCULAR | Status: AC
  Administered 2011-04-09: 80 mg via INTRAMUSCULAR

## 2011-04-09 NOTE — Progress Notes (Signed)
04/09/11-75 year old male former smoker followed for asthma/COPD, insomnia, complicated by HBP, chronic gastritis, GERD Last here 04/11/2010 Has had flu vaccine He reports increased sinus drainage in April. He saw an ENT doctor in [place] at [facility] who did a CT. That problem has been doing better. He has persistent sense of chest congestion especially in the right lung. Productive cough affects his sleep. Coughing yellow or sometimes darker sputum. He denies reflux, fever, swollen glands or blood. Weight is down 7 or 8 pounds. Poor sense of smell has decreased his appetite. The [facility] has also treated his bronchitis/COPD with prednisone and antibiotic without help. He has taken prednisone tapers twice and antibiotics twice. None of this has made a big difference in his cough.  ROS See HPI Constitutional:   No-   weight loss, night sweats, fevers, chills, fatigue, lassitude. HEENT:   No-  headaches, difficulty swallowing, tooth/dental problems, sore throat,       No-  sneezing, itching, ear ache,     no current- nasal congestion, post nasal drip,  CV:  No-   chest pain, orthopnea, PND, swelling in lower extremities, anasarca, izziness, palpitations Resp: No-   shortness of breath with exertion or at rest.              Persistent  productive cough,  No non-productive cough,  No- coughing up of blood.              +   change in color of mucus.  No- wheezing.   Skin: No-   rash or lesions. GI:  No-   heartburn, indigestion, abdominal pain, nausea, vomiting, diarrhea,                 change in bowel habits, loss of appetite GU: No-   dysuria, change in color of urine, no urgency or frequency.  No- flank pain. MS:  No-   joint pain or swelling.  No- decreased range of motion.  No- back pain. Neuro-     nothing unusual Psych:  No- change in mood or affect. No depression or anxiety.  No memory loss.  OBJ General- Alert, Oriented, Affect-appropriate, Distress- none acute; overweight Skin-  rash-none, lesions- none, excoriation- none Lymphadenopathy- none Head- atraumatic            Eyes- Gross vision intact, PERRLA, conjunctivae clear secretions            Ears- Hearing, canals-normal            Nose- Clear, no-Septal dev, mucus, polyps, erosion, perforation             Throat- Mallampati III , mucosa clear , drainage- none, tonsils- atrophic Neck- flexible , trachea midline, no stridor , thyroid nl, carotid no bruit Chest - symmetrical excursion , unlabored           Heart/CV- RRR , no murmur , no gallop  , no rub, nl s1 s2                           - JVD- none , edema- none, stasis changes- none, varices- none           Lung- coarse rhonchi, right greater than left. Unlabored. , dullness-none, rub- none           Chest wall-  Abd- tender-no, distended-no, bowel sounds-present, HSM- no Br/ Gen/ Rectal- Not done, not indicated Extrem- cyanosis- none, clubbing, none, atrophy- none, strength- nl  Neuro- grossly intact to observation

## 2011-04-09 NOTE — Patient Instructions (Signed)
Order- Lab CBC w/ diff             Sputum C+S, gram stain      Dx COPD/ bronchitis               CXR   Neb albut  Depo 80  Consider salt water rinsing your nose and sinuses with a squeeze bottle or Neti pot

## 2011-04-10 NOTE — Progress Notes (Signed)
Quick Note:  LMTCB ______ 

## 2011-04-11 DIAGNOSIS — J00 Acute nasopharyngitis [common cold]: Secondary | ICD-10-CM | POA: Insufficient documentation

## 2011-04-11 DIAGNOSIS — J310 Chronic rhinitis: Secondary | ICD-10-CM | POA: Insufficient documentation

## 2011-04-11 NOTE — Assessment & Plan Note (Addendum)
Chronic bronchitis pattern. We need to see an updated chest x-ray and may need to culture sputum and get CBC. He may have bronchiectasis and we may need to consider a CT scan of the chest.

## 2011-04-11 NOTE — Assessment & Plan Note (Signed)
Symptomatically improved now after multiple rounds of prednisone and antibiotics.

## 2011-04-13 NOTE — Progress Notes (Signed)
Quick Note:  Pt aware of results. ______ 

## 2011-04-22 ENCOUNTER — Other Ambulatory Visit: Payer: Medicare Other

## 2011-04-22 DIAGNOSIS — IMO0001 Reserved for inherently not codable concepts without codable children: Secondary | ICD-10-CM

## 2011-04-26 LAB — RESPIRATORY CULTURE OR RESPIRATORY AND SPUTUM CULTURE

## 2011-04-29 NOTE — Progress Notes (Signed)
Quick Note:  ATC x 3 line busy each time; will need to try again later. ______

## 2011-05-08 ENCOUNTER — Telehealth: Payer: Self-pay | Admitting: Internal Medicine

## 2011-05-08 MED ORDER — LEVOFLOXACIN 500 MG PO TABS: 500.0000 mg | ORAL_TABLET | Freq: Every day | ORAL | Status: DC

## 2011-05-08 NOTE — Telephone Encounter (Signed)
Sputum culture- bacteria resistant to penicillins. Please order levaquin 500 mg, # 10, 1 daily. If he has not had a pneumonia vaccine in past 5 years, he needs one  ATC line busy x3 wcb

## 2011-05-08 NOTE — Telephone Encounter (Signed)
ATC pt again and line is still busy wcb

## 2011-05-08 NOTE — Progress Notes (Signed)
Quick Note:  LMTCB ______ 

## 2011-05-08 NOTE — Progress Notes (Signed)
Quick Note:  Pt aware of results and rx sent to [facility] in [place] will get pneumonia shot on 05-18-11 appt with CY as it is a 2 hour drive here. ______

## 2011-05-08 NOTE — Telephone Encounter (Signed)
Spoke with patient-aware of results and recs from CY-rx sent to [facility] in [place] and patient will get his PNA vaccine on 05-18-11 visit with CY.

## 2011-05-08 NOTE — Telephone Encounter (Signed)
Pt asked Katie to reach him at [phone].  Antionette Fairy

## 2011-05-18 ENCOUNTER — Ambulatory Visit (INDEPENDENT_AMBULATORY_CARE_PROVIDER_SITE_OTHER): Payer: Medicare Other | Admitting: Internal Medicine

## 2011-05-18 ENCOUNTER — Encounter: Payer: Self-pay | Admitting: Internal Medicine

## 2011-05-18 VITALS — BP 144/80 | HR 83 | Ht 70.0 in | Wt 183.0 lb

## 2011-05-18 DIAGNOSIS — J449 Chronic obstructive pulmonary disease, unspecified: Secondary | ICD-10-CM

## 2011-05-18 DIAGNOSIS — Z23 Encounter for immunization: Secondary | ICD-10-CM

## 2011-05-18 MED ORDER — ROFLUMILAST 500 MCG PO TABS: 500.0000 ug | ORAL_TABLET | Freq: Every day | ORAL | Status: DC

## 2011-05-18 NOTE — Progress Notes (Signed)
04/09/11-75 year old male former smoker followed for asthma/COPD, insomnia, complicated by HBP, chronic gastritis, GERD Last here 04/11/2010 Has had flu vaccine He reports increased sinus drainage in April. He saw an ENT doctor in [place] at [facility] who did a CT. That problem has been doing better. He has persistent sense of chest congestion especially in the right lung. Productive cough affects his sleep. Coughing yellow or sometimes darker sputum. He denies reflux, fever, swollen glands or blood. Weight is down 7 or 8 pounds. Poor sense of smell has decreased his appetite. The [facility] has also treated his bronchitis/COPD with prednisone and antibiotic without help. He has taken prednisone tapers twice and antibiotics twice. None of this has made a big difference in his cough.  05/18/11- 75 year old male former smoker followed for asthma/COPD, insomnia, complicated by HBP, chronic gastritis, GERD At last visit he needed a nebulizer treatment and Depo-Medrol, and we had recommended saline nasal lavage. These did well. He says now that he is "better than I was, just comes and goes now". Sputum had grown a pneumococcus strongly resistant to penicillin and cephalosporins. We had sent Levaquin x10 days. She just finished this morning. Walking on a treadmill helps him loosen the sputum. He has had pneumonia vaccine in 1984 and 1994. He says "Mucinex tears my lungs up". CBC 04/09/2011-[facility] 8500 Chest x-ray: 04/09/2011-COPD without acute process.  ROS See HPI Constitutional:   No-   weight loss, night sweats, fevers, chills, fatigue, lassitude. HEENT:   No-  headaches, difficulty swallowing, tooth/dental problems, sore throat,       No-  sneezing, itching, ear ache,     no current- nasal congestion, post nasal drip,  CV:  No-   chest pain, orthopnea, PND, swelling in lower extremities, anasarca, izziness, palpitations Resp: No-   shortness of breath with exertion or at rest.    Persistent  productive cough,  No non-productive cough,  No- coughing up of blood.              +   change in color of mucus.  No- wheezing.   Skin: No-   rash or lesions. GI:  No-   heartburn, indigestion, abdominal pain, nausea, vomiting, diarrhea,                 change in bowel habits, loss of appetite GU: No-   dysuria, change in color of urine, no urgency or frequency.  No- flank pain. MS:  No-   joint pain or swelling.  No- decreased range of motion.  No- back pain. Neuro-     nothing unusual Psych:  No- change in mood or affect. No depression or anxiety.  No memory loss.  OBJ General- Alert, Oriented, Affect-appropriate, Distress- none acute; overweight Skin- rash-none, lesions- none, excoriation- none Lymphadenopathy- none Head- atraumatic            Eyes- Gross vision intact, PERRLA, conjunctivae clear secretions            Ears- Hearing, canals-normal            Nose- Clear, no-Septal dev, mucus, polyps, erosion, perforation             Throat- Mallampati III , mucosa clear , drainage- none, tonsils- atrophic Neck- flexible , trachea midline, no stridor , thyroid nl, carotid no bruit Chest - symmetrical excursion , unlabored           Heart/CV- RRR , no murmur , no gallop  , no rub, nl s1  s2                           - JVD- none , edema- none, stasis changes- none, varices- none           Lung-diffuse wheeze and rhonchi. Unlabored. , dullness-none, rub- none           Chest wall-  Abd- tender-no, distended-no, bowel sounds-present, HSM- no Br/ Gen/ Rectal- Not done, not indicated Extrem- cyanosis- none, clubbing, none, atrophy- none, strength- nl Neuro- grossly intact to observation

## 2011-05-18 NOTE — Patient Instructions (Signed)
Sample/ script/ card [employer] with 1/2 pill daily or every other day, after food, for a couple of weeks. Once your stomach is used to it, see if you can go up to one daily.   Pneumovax

## 2011-05-19 NOTE — Assessment & Plan Note (Signed)
Acute bronchitic exacerbation with a penicillin resistant pneumococcus. Sputum is now for quite after 10 days of Levaquin. We discussed a trial of [name] and we'll repeat the pneumonia vaccine when last time with discussion.

## 2011-06-01 ENCOUNTER — Telehealth: Payer: Self-pay | Admitting: Internal Medicine

## 2011-06-01 NOTE — Telephone Encounter (Signed)
Spoke with pt. He states that daliresp requires PA- I have called [facility] and asked that they fax [place] the PA request, and in the meatimes have explained the PA process to the pt and have left him samples of med up front for pick up. Pt verbalized understanding. Will await fax.

## 2011-06-03 NOTE — Telephone Encounter (Signed)
Called for Pa on [name] to [employer] at [phone]. Member ID # [id number]. Pt is currently on Symbicort. Pt has tried and failed Foradil and Asmanex in the past. Pt has DX of COPD, 496. PA will be sent for clinical review and we should receive a response within 48 hours. Will forward to Florentina Addison so she is aware and can watch for ins response.

## 2011-06-10 NOTE — Telephone Encounter (Signed)
The additional information needed for PA has been faxed back to insurance company-papers in Triage waiting area. Waiting on response.

## 2011-06-11 NOTE — Telephone Encounter (Signed)
Left message that RX approved. Sent approval letter to HIM to scan in EPIC.

## 2011-07-31 ENCOUNTER — Telehealth: Payer: Self-pay | Admitting: Internal Medicine

## 2011-07-31 NOTE — Telephone Encounter (Signed)
I spoke with pt and he states the dalisresp causes him to stay jittery all the time and  he feels like when he wakes up from a nap he is breathing really hard. Pt states this has been going on since starting the daliresp. Pt states he is going to cut the dalisrep in half and see how he does w/ a half dose. He is wanting to make CDY aware of what he is doing. Please advise Dr. Maple Hudson, thanks

## 2011-07-31 NOTE — Telephone Encounter (Signed)
Pt is aware.  

## 2011-07-31 NOTE — Telephone Encounter (Signed)
Per Cy-He agrees try half dose.

## 2011-08-18 ENCOUNTER — Encounter: Payer: Self-pay | Admitting: Internal Medicine

## 2011-08-18 ENCOUNTER — Ambulatory Visit (INDEPENDENT_AMBULATORY_CARE_PROVIDER_SITE_OTHER): Payer: Medicare Other | Admitting: Internal Medicine

## 2011-08-18 ENCOUNTER — Other Ambulatory Visit (INDEPENDENT_AMBULATORY_CARE_PROVIDER_SITE_OTHER): Payer: Medicare Other

## 2011-08-18 VITALS — BP 142/82 | HR 90 | Ht 70.0 in | Wt 177.0 lb

## 2011-08-18 DIAGNOSIS — J449 Chronic obstructive pulmonary disease, unspecified: Secondary | ICD-10-CM

## 2011-08-18 DIAGNOSIS — E0590 Thyrotoxicosis, unspecified without thyrotoxic crisis or storm: Secondary | ICD-10-CM

## 2011-08-18 DIAGNOSIS — Z8719 Personal history of other diseases of the digestive system: Secondary | ICD-10-CM

## 2011-08-18 DIAGNOSIS — J4489 Other specified chronic obstructive pulmonary disease: Secondary | ICD-10-CM

## 2011-08-18 NOTE — Progress Notes (Signed)
04/09/11-76 year old male former smoker followed for asthma/COPD, insomnia, complicated by HBP, chronic gastritis, GERD Last here 04/11/2010 Has had flu vaccine He reports increased sinus drainage in April. He saw an ENT doctor in [place] at [facility] who did a CT. That problem has been doing better. He has persistent sense of chest congestion especially in the right lung. Productive cough affects his sleep. Coughing yellow or sometimes darker sputum. He denies reflux, fever, swollen glands or blood. Weight is down 7 or 8 pounds. Poor sense of smell has decreased his appetite. The [facility] has also treated his bronchitis/COPD with prednisone and antibiotic without help. He has taken prednisone tapers twice and antibiotics twice. None of this has made a big difference in his cough.  05/18/11- 76 year old male former smoker followed for asthma/COPD, insomnia, complicated by HBP, chronic gastritis, GERD At last visit he needed a nebulizer treatment and Depo-Medrol, and we had recommended saline nasal lavage. These did well. He says now that he is "better than I was, just comes and goes now". Sputum had grown a pneumococcus strongly resistant to penicillin and cephalosporins. We had sent Levaquin x10 days. -he just finished this morning. Walking on a treadmill helps him loosen the sputum. He has had pneumonia vaccine in 1984 and 1994. He says "Mucinex tears my lungs up". CBC 04/09/2011-WBC 8500 Chest x-ray: 04/09/2011-COPD without acute process.  08/18/11- 76 year old male former smoker followed for asthma/COPD, insomnia, complicated by HBP, chronic gastritis, GERD [name] overstimulated and made him jittery. He is trying to take one half pill daily because it did reduce his mucus production. Cough with white sputum. Reduced dose did not help his nervousness. Some tachycardia palpitation. Weight down 7 or 8 pounds. Blames [name] for increased reflux. Exercising on treadmill 20 minutes daily. Gets  shifting pains around anterior chest up into the shoulders, not clearly related to activity or exertion.  ROS-See HPI Constitutional:   No-   weight loss, night sweats, fevers, chills, fatigue, lassitude. HEENT:   No-  headaches, difficulty swallowing, tooth/dental problems, sore throat,       No-  sneezing, itching, ear ache,     no current- nasal congestion, post nasal drip,  CV:  + chest pain, orthopnea, PND, swelling in lower extremities, anasarca, izziness, palpitations Resp: No-   shortness of breath with exertion or at rest.              Persistent  productive cough,  No non-productive cough,  No- coughing up of blood.              +   change in color of mucus.  No- wheezing.   Skin: No-   rash or lesions. GI: + heartburn, indigestion,  No-abdominal pain, nausea, vomiting, diarrhea,                 change in bowel habits, loss of appetite GU:  MS:  No-   joint pain or swelling.  No- decreased range of motion.  No- back pain. Neuro-     nothing unusual Psych:  No- change in mood or affect. No depression or anxiety.  No memory loss.  OBJ General- Alert, Oriented, Affect-appropriate, Distress- none acute; overweight Skin- rash-none, lesions- none, excoriation- none Lymphadenopathy- none Head- atraumatic            Eyes- Gross vision intact, PERRLA, conjunctivae clear secretions            Ears- Hearing, canals-normal  Nose- Clear, no-Septal dev, mucus, polyps, erosion, perforation             Throat- Mallampati III , mucosa cobblestoned , drainage- none, tonsils- atrophic Neck- flexible , trachea midline, no stridor , thyroid nl, carotid no bruit Chest - symmetrical excursion , unlabored           Heart/CV- RRR , no murmur , no gallop  , no rub, nl s1 s2                           - JVD- none , edema- none, stasis changes- none, varices- none           Lung-coarse w/o wheeze, loose cough. Unlabored. , dullness-none, rub- none           Chest wall-  Abd-  Br/ Gen/  Rectal- Not done, not indicated Extrem- cyanosis- none, clubbing, none, atrophy- none, strength- nl Neuro- grossly intact to observation

## 2011-08-18 NOTE — Patient Instructions (Signed)
Stop [name] for now. We can go back to it later if needed. Watch to see if the jittery feeling and the heart burn settle down.   Order- lab  TSH    Dx hyperthyroid

## 2011-08-22 NOTE — Assessment & Plan Note (Signed)
He may be describing more than 1 chest pain. Esophagitis is part of it. I doubt cardiac pain. Management of reflux with precautions and medication was discussed.

## 2011-08-22 NOTE — Assessment & Plan Note (Signed)
Chronic bronchitis with productive cough. No infection. [name] may have produced sputum production but [name] blames it for nervousness and increased heartburn. Plan-stop [name] for one or 2 weeks to see if the jitteriness goes away. Lab-TSH

## 2011-09-01 ENCOUNTER — Telehealth: Payer: Self-pay | Admitting: Internal Medicine

## 2011-09-01 NOTE — Progress Notes (Signed)
Quick Note:  Pt aware tyroid function test is normal range per Dr. Maple Hudson. He verbalized understanding of this and voiced no further questions/cocnerns at this time ______

## 2011-09-01 NOTE — Telephone Encounter (Signed)
Notes Recorded by Abigail Miyamoto, RN on 09/01/2011 at 12:07 PM LMOMTCB x 1 ------  Notes Recorded by Waymon Budge, MD on 08/18/2011 at 8:17 PM Thyroid function test is in normal range.   Called, spoke with pt.  I informed him tyroid function test is in normal range per CDY.  He verbalized understanding of this.  He would like for me to inform CDY he is doing better since stopping the daliresp.  Advised will forward message to CDY as FYI.  Will sign off on msg as it is FYI only.

## 2011-11-17 ENCOUNTER — Ambulatory Visit (INDEPENDENT_AMBULATORY_CARE_PROVIDER_SITE_OTHER): Payer: Medicare Other | Admitting: Internal Medicine

## 2011-11-17 ENCOUNTER — Encounter: Payer: Self-pay | Admitting: Internal Medicine

## 2011-11-17 VITALS — BP 120/68 | HR 82 | Ht 70.0 in | Wt 180.8 lb

## 2011-11-17 DIAGNOSIS — Z8719 Personal history of other diseases of the digestive system: Secondary | ICD-10-CM

## 2011-11-17 DIAGNOSIS — J449 Chronic obstructive pulmonary disease, unspecified: Secondary | ICD-10-CM

## 2011-11-17 NOTE — Progress Notes (Signed)
04/09/11-76 year old male former smoker followed for asthma/COPD, insomnia, complicated by HBP, chronic gastritis, GERD Last here 04/11/2010 Has had flu vaccine He reports increased sinus drainage in April. He saw an ENT doctor in [place] at [facility] who did a CT. That problem has been doing better. He has persistent sense of chest congestion especially in the right lung. Productive cough affects his sleep. Coughing yellow or sometimes darker sputum. He denies reflux, fever, swollen glands or blood. Weight is down 7 or 8 pounds. Poor sense of smell has decreased his appetite. The [facility] has also treated his bronchitis/COPD with prednisone and antibiotic without help. He has taken prednisone tapers twice and antibiotics twice. None of this has made a big difference in his cough.  05/18/11- 76 year old male former smoker followed for asthma/COPD, insomnia, complicated by HBP, chronic gastritis, GERD At last visit he needed a nebulizer treatment and Depo-Medrol, and we had recommended saline nasal lavage. These did well. He says now that he is "better than I was, just comes and goes now". Sputum had grown a pneumococcus strongly resistant to penicillin and cephalosporins. We had sent Levaquin x10 days. -he just finished this morning. Walking on a treadmill helps him loosen the sputum. He has had pneumonia vaccine in 1984 and 1994. He says "Mucinex tears my lungs up". CBC 04/09/2011-WBC 8500 Chest x-ray: 04/09/2011-COPD without acute process.  08/18/11- 76 year old male former smoker followed for asthma/COPD, insomnia, complicated by HBP, chronic gastritis, GERD [name] overstimulated and made him jittery. He is trying to take one half pill daily because it did reduce his mucus production. Cough with white sputum. Reduced dose did not help his nervousness. Some tachycardia palpitation. Weight down 7 or 8 pounds. Blames [name] for increased reflux. Exercising on treadmill 20 minutes daily. Gets  shifting pains around anterior chest up into the shoulders, not clearly related to activity or exertion.  11/17/11- 76 year old male former smoker followed for asthma/COPD, insomnia, complicated by HBP, chronic gastritis, GERD Sob,wheezing upon activity and weather.  He quit [name] because it was making him depressed. Otherwise says he is "pretty good". Chest pain and tightness have improved. Sinus drainage stopped with over-the-counter antihistamines. He is walking 2 miles each morning and 1 mild each evening. Taking Benadryl at bedtime. CAT COPD score 29. CXR 04/13/11- Reviewed: IMPRESSION:  Chronic obstructive pulmonary disease without acute superimposed  process.  Original Report Authenticated By: Gerrianne Scale, M.D.   ROS-See HPI Constitutional:   No-   weight loss, night sweats, fevers, chills, fatigue, lassitude. HEENT:   No-  headaches, difficulty swallowing, tooth/dental problems, sore throat,       No-  sneezing, itching, ear ache,     no current- nasal congestion, post nasal drip,  CV:  + chest pain, orthopnea, PND, swelling in lower extremities, anasarca, izziness, palpitations Resp: No-   shortness of breath with exertion or at rest.              Persistent  productive cough,  No non-productive cough,  No- coughing up of blood.              +   change in color of mucus.  No- wheezing.   Skin: No-   rash or lesions. GI: + heartburn, indigestion,  No-abdominal pain, nausea, vomiting,  GU:  MS:  No-   joint pain or swelling.  No- decreased range of motion.  No- back pain. Neuro-     nothing unusual Psych:  No- change in mood or affect.  No depression or anxiety.  No memory loss.  OBJ General- Alert, Oriented, Affect-appropriate, Distress- none acute; overweight Skin- rash-none, lesions- none, excoriation- none Lymphadenopathy- none Head- atraumatic            Eyes- Gross vision intact, PERRLA, conjunctivae clear secretions            Ears- Hearing, canals-normal             Nose- Clear, no-Septal dev, mucus, polyps, erosion, perforation             Throat- Mallampati III , mucosa cobblestoned , drainage- none, tonsils- atrophic Neck- flexible , trachea midline, no stridor , thyroid nl, carotid no bruit Chest - symmetrical excursion , unlabored           Heart/CV- RRR , no murmur , no gallop  , no rub, nl s1 s2                           - JVD- none , edema- none, stasis changes- none, varices- none           Lung-almost clear today. Unlabored. , dullness-none, rub- none           Chest wall-  Abd-  Br/ Gen/ Rectal- Not done, not indicated Extrem- cyanosis- none, clubbing, none, atrophy- none, strength- nl Neuro- grossly intact to observation

## 2011-11-17 NOTE — Patient Instructions (Signed)
Continue present treatment - please call as needed 

## 2011-11-22 NOTE — Assessment & Plan Note (Signed)
He is much improved. We will keep emphasis on prevention of reflux. I'm very pleased he is doing the regular walking. Antihistamines may be helping at least with postnasal drainage.

## 2011-11-22 NOTE — Assessment & Plan Note (Signed)
Better control. We discussed medication and reflux precautions. Educated on potential for reflux to contribute to chronic bronchitis and chronic cough.

## 2012-03-21 ENCOUNTER — Other Ambulatory Visit: Payer: Medicare Other

## 2012-03-21 ENCOUNTER — Encounter: Payer: Self-pay | Admitting: Internal Medicine

## 2012-03-21 ENCOUNTER — Ambulatory Visit (INDEPENDENT_AMBULATORY_CARE_PROVIDER_SITE_OTHER): Payer: Medicare Other | Admitting: Internal Medicine

## 2012-03-21 ENCOUNTER — Ambulatory Visit (INDEPENDENT_AMBULATORY_CARE_PROVIDER_SITE_OTHER)
Admission: RE | Admit: 2012-03-21 | Discharge: 2012-03-21 | Disposition: A | Discharge status: Home / Self Care | Payer: Medicare Other | Source: Ambulatory Visit | Attending: Internal Medicine | Admitting: Internal Medicine

## 2012-03-21 VITALS — BP 136/64 | HR 91 | Ht 70.0 in | Wt 178.6 lb

## 2012-03-21 DIAGNOSIS — J42 Unspecified chronic bronchitis: Secondary | ICD-10-CM

## 2012-03-21 DIAGNOSIS — J449 Chronic obstructive pulmonary disease, unspecified: Secondary | ICD-10-CM

## 2012-03-21 DIAGNOSIS — Z23 Encounter for immunization: Secondary | ICD-10-CM

## 2012-03-21 MED ORDER — AZITHROMYCIN 250 MG PO TABS: ORAL_TABLET | ORAL | Status: DC

## 2012-03-21 NOTE — Progress Notes (Signed)
04/[birth year]/11-76 year old male former smoker followed for asthma/COPD, insomnia, complicated by HBP, chronic gastritis, GERD Last here 04/11/2010 Has had flu vaccine He reports increased sinus drainage in April. He saw an ENT doctor in [place] at [facility] who did a CT. That problem has been doing better. He has persistent sense of chest congestion especially in the right lung. Productive cough affects his sleep. Coughing yellow or sometimes darker sputum. He denies reflux, fever, swollen glands or blood. Weight is down 7 or 8 pounds. Poor sense of smell has decreased his appetite. The [facility] has also treated his bronchitis/COPD with prednisone and antibiotic without help. He has taken prednisone tapers twice and antibiotics twice. None of this has made a big difference in his cough.  05/18/11- 75 year old male former smoker followed for asthma/COPD, insomnia, complicated by HBP, chronic gastritis, GERD At last visit he needed a nebulizer treatment and Depo-Medrol, and we had recommended saline nasal lavage. These did well. He says now that he is "better than I was, just comes and goes now". Sputum had grown a pneumococcus strongly resistant to penicillin and cephalosporins. We had sent Levaquin x10 days. -he just finished this morning. Walking on a treadmill helps him loosen the sputum. He has had pneumonia vaccine in 1984 and 1994. He says "Mucinex tears my lungs up". CBC 04/09/2011-WBC 8500 Chest x-ray: 04/09/2011-COPD without acute process.  08/18/11- 76 year old male former smoker followed for asthma/COPD, insomnia, complicated by HBP, chronic gastritis, GERD [name] overstimulated and made him jittery. He is trying to take one half pill daily because it did reduce his mucus production. Cough with white sputum. Reduced dose did not help his nervousness. Some tachycardia palpitation. Weight down 7 or 8 pounds. Blames [name] for increased reflux. Exercising on treadmill 20 minutes daily. Gets  shifting pains around anterior chest up into the shoulders, not clearly related to activity or exertion.  11/17/11- 76 year old male former smoker followed for asthma/COPD, insomnia, complicated by HBP, chronic gastritis, GERD Sob,wheezing upon activity and weather.  He quit [name] because it was making him depressed. Otherwise says he is "pretty good". Chest pain and tightness have improved. Sinus drainage stopped with over-the-counter antihistamines. He is walking 2 miles each morning and 1 mild each evening. Taking Benadryl at bedtime. CAT COPD score 29. CXR 04/13/11- Reviewed: IMPRESSION:  Chronic obstructive pulmonary disease without acute superimposed  process.  Original Report Authenticated By: Gerrianne Scale, M.D.   03/21/12- 76 year old male former smoker followed for chronic bronchitis/COPD, insomnia, complicated by HBP, chronic gastritis, GERD Productive cough-yellow to green in color; wheezing, SOB, congestion-chest only(feels like Right side mainly) COPD assessment test (CABG) score 35/40 Sensitive to temperature and weather changes triggering cough. Sputum is chronic, usually yellow green with no blood. He did not tolerate [name]  ROS-See HPI Constitutional:   No-   weight loss, night sweats, fevers, chills, fatigue, lassitude. HEENT:   No-  headaches, difficulty swallowing, tooth/dental problems, sore throat,       No-  sneezing, itching, ear ache,     no current- nasal congestion, post nasal drip,  CV:  + chest pain, orthopnea, PND, swelling in lower extremities, anasarca, izziness, palpitations Resp: No- acute shortness of breath with exertion or at rest.              +Persistent  productive cough,  No non-productive cough,  No- coughing up of blood.              +   change in color of mucus.  No- wheezing.   Skin: No-   rash or lesions. GI: + heartburn, indigestion,  No-abdominal pain, nausea, vomiting,  GU:  MS:  No-   joint pain or swelling.  No- decreased range  of motion.  No- back pain. Neuro-     nothing unusual Psych:  No- change in mood or affect. No depression or anxiety.  No memory loss.  OBJ General- Alert, Oriented, Affect-appropriate, Distress- none acute; overweight Skin- rash-none, lesions- none, excoriation- none Lymphadenopathy- none Head- atraumatic            Eyes- Gross vision intact, PERRLA, conjunctivae clear secretions            Ears- Hearing, canals-normal            Nose- Clear, no-Septal dev, mucus, polyps, erosion, perforation             Throat- Mallampati III , mucosa cobblestoned , drainage- none, tonsils- atrophic Neck- flexible , trachea midline, no stridor , thyroid nl, carotid no bruit Chest - symmetrical excursion , unlabored           Heart/CV- RRR , no murmur , no gallop  , no rub, nl s1 s2                           - JVD- none , edema- none, stasis changes- none, varices- none           Lung-+ coarse rattle rhonchi. Unlabored. , dullness-none, rub- none           Chest wall-  Abd-  Br/ Gen/ Rectal- Not done, not indicated Extrem- cyanosis- none, clubbing, none, atrophy- none, strength- nl Neuro- grossly intact to observation

## 2012-03-21 NOTE — Patient Instructions (Addendum)
Order CXR   Dx chronic bronchitis  Sputum for routine C&S and AFB smear and culture   Dx chronic bronchitis  Script for maintenance zithromax for bronchitis suppression  Flu vax

## 2012-03-24 LAB — RESPIRATORY CULTURE OR RESPIRATORY AND SPUTUM CULTURE: Organism ID, Bacteria: NORMAL

## 2012-03-25 NOTE — Progress Notes (Signed)
Quick Note:  Pt aware of results. ______ 

## 2012-03-25 NOTE — Progress Notes (Signed)
Quick Note:  LMTCB ______ 

## 2012-03-29 NOTE — Assessment & Plan Note (Signed)
Plan-chest x-ray. Try maintenance Zithromax for suppression of bronchitis.

## 2012-05-05 ENCOUNTER — Telehealth: Payer: Self-pay | Admitting: Internal Medicine

## 2012-05-05 NOTE — Progress Notes (Signed)
Quick Note:  LMTCB ______ 

## 2012-05-05 NOTE — Progress Notes (Signed)
Quick Note:  Pt aware of results. ______ 

## 2012-05-05 NOTE — Telephone Encounter (Signed)
Notes Recorded by Waymon Budge, MD on 05/04/2012 at 3:59 PM Sputum cultures from September growing only mixed, routine airway bacteria.  Spoke with pt and notified of recs per CDY He verbalized understanding and denied any questions

## 2012-06-20 ENCOUNTER — Ambulatory Visit (INDEPENDENT_AMBULATORY_CARE_PROVIDER_SITE_OTHER): Payer: Medicare Other | Admitting: Internal Medicine

## 2012-06-20 ENCOUNTER — Encounter: Payer: Self-pay | Admitting: Internal Medicine

## 2012-06-20 VITALS — BP 152/80 | HR 78 | Ht 69.0 in | Wt 187.0 lb

## 2012-06-20 DIAGNOSIS — J449 Chronic obstructive pulmonary disease, unspecified: Secondary | ICD-10-CM

## 2012-06-20 NOTE — Progress Notes (Signed)
04/09/11-76 year old male former smoker followed for asthma/COPD, insomnia, complicated by HBP, chronic gastritis, GERD Last here 04/11/2010 Has had flu vaccine He reports increased sinus drainage in April. He saw an ENT doctor in [place] at [facility] who did a CT. That problem has been doing better. He has persistent sense of chest congestion especially in the right lung. Productive cough affects his sleep. Coughing yellow or sometimes darker sputum. He denies reflux, fever, swollen glands or blood. Weight is down 7 or 8 pounds. Poor sense of smell has decreased his appetite. The [facility] has also treated his bronchitis/COPD with prednisone and antibiotic without help. He has taken prednisone tapers twice and antibiotics twice. None of this has made a big difference in his cough.  05/18/11- 76 year old male former smoker followed for asthma/COPD, insomnia, complicated by HBP, chronic gastritis, GERD At last visit he needed a nebulizer treatment and Depo-Medrol, and we had recommended saline nasal lavage. These did well. He says now that he is "better than I was, just comes and goes now". Sputum had grown a pneumococcus strongly resistant to penicillin and cephalosporins. We had sent Levaquin x10 days. -he just finished this morning. Walking on a treadmill helps him loosen the sputum. He has had pneumonia vaccine in 1984 and 1994. He says "Mucinex tears my lungs up". CBC 04/09/2011-WBC 8500 Chest x-ray: 04/09/2011-COPD without acute process.  08/18/11- 76 year old male former smoker followed for asthma/COPD, insomnia, complicated by HBP, chronic gastritis, GERD [name] overstimulated and made him jittery. He is trying to take one half pill daily because it did reduce his mucus production. Cough with white sputum. Reduced dose did not help his nervousness. Some tachycardia palpitation. Weight down 7 or 8 pounds. Blames [name] for increased reflux. Exercising on treadmill 20 minutes daily. Gets  shifting pains around anterior chest up into the shoulders, not clearly related to activity or exertion.  11/17/11- 76 year old male former smoker followed for asthma/COPD, insomnia, complicated by HBP, chronic gastritis, GERD Sob,wheezing upon activity and weather.  He quit [name] because it was making him depressed. Otherwise says he is "pretty good". Chest pain and tightness have improved. Sinus drainage stopped with over-the-counter antihistamines. He is walking 2 miles each morning and 1 mild each evening. Taking Benadryl at bedtime. CAT COPD score 29. CXR 04/13/11- Reviewed: IMPRESSION:  Chronic obstructive pulmonary disease without acute superimposed  process.  Original Report Authenticated By: Gerrianne Scale, M.D.   03/21/12- 76 year old male former smoker followed for chronic bronchitis/COPD, insomnia, complicated by HBP, chronic gastritis, GERD Productive cough-yellow to green in color; wheezing, SOB, congestion-chest only(feels like Right side mainly) COPD assessment test (CABG) score 35/40 Sensitive to temperature and weather changes triggering cough. Sputum is chronic, usually yellow green with no blood. He did not tolerate [name]  06/20/12- 76 year old male former smoker followed for chronic bronchitis/COPD, insomnia, complicated by HBP, chronic gastritis, GERD FOLLOWS FOR: breathing is doing fine. denies chest pain, chest tightness, increased SOB. Feels "fine today". Antibiotic cleared infection. On prednisone taper for cervical arthritis. CXR 03/25/12-  IMPRESSION:  1. Bibasilar opacities are new from previous exam and may  represent atelectasis or infiltrate.  Original Report Authenticated By: Rosealee Albee, M.D.   ROS-See HPI Constitutional:   No-   weight loss, night sweats, fevers, chills, fatigue, lassitude. HEENT:   No-  headaches, difficulty swallowing, tooth/dental problems, sore throat,       No-  sneezing, itching, ear ache,     no current- nasal  congestion, post nasal drip,  CV:  + chest pain, orthopnea, PND, swelling in lower extremities, anasarca, izziness, palpitations Resp: No- acute shortness of breath with exertion or at rest.              +Persistent  productive cough,  No non-productive cough,  No- coughing up of blood.              +   change in color of mucus.  No- wheezing.   Skin: No-   rash or lesions. GI: + heartburn, indigestion,  No-abdominal pain, nausea, vomiting,  GU:  MS:  No-   joint pain or swelling.  No- decreased range of motion.  No- back pain. Neuro-     nothing unusual Psych:  No- change in mood or affect. No depression or anxiety.  No memory loss.  OBJ General- Alert, Oriented, Affect-appropriate, Distress- none acute; overweight Skin- rash-none, lesions- none, excoriation- none Lymphadenopathy- none Head- atraumatic            Eyes- Gross vision intact, PERRLA, conjunctivae clear secretions            Ears- Hearing, canals-normal            Nose- Clear, no-Septal dev, mucus, polyps, erosion, perforation             Throat- Mallampati III , mucosa cobblestoned , drainage- none, tonsils- atrophic Neck- flexible , trachea midline, no stridor , thyroid nl, carotid no bruit Chest - symmetrical excursion , unlabored           Heart/CV- RRR , no murmur , no gallop  , no rub, nl s1 s2                           - JVD- none , edema- none, stasis changes- none, varices- none           Lung-+ trace rhonchi, much clearer than last visit.Eyvonne Mechanic. , dullness-none, rub- none           Chest wall-  Abd-  Br/ Gen/ Rectal- Not done, not indicated Extrem- cyanosis- none, clubbing, none, atrophy- none, strength- nl Neuro- grossly intact to observation

## 2012-06-20 NOTE — Patient Instructions (Addendum)
Please call as needed 

## 2012-06-27 ENCOUNTER — Ambulatory Visit (HOSPITAL_COMMUNITY)
Admission: RE | Admit: 2012-06-27 | Discharge: 2012-06-27 | Disposition: A | Discharge status: Home / Self Care | Payer: Medicare Other | Source: Ambulatory Visit | Attending: *Deleted | Admitting: *Deleted

## 2012-06-27 DIAGNOSIS — J449 Chronic obstructive pulmonary disease, unspecified: Secondary | ICD-10-CM | POA: Insufficient documentation

## 2012-06-27 DIAGNOSIS — M546 Pain in thoracic spine: Secondary | ICD-10-CM | POA: Insufficient documentation

## 2012-06-27 DIAGNOSIS — M6281 Muscle weakness (generalized): Secondary | ICD-10-CM | POA: Insufficient documentation

## 2012-06-27 DIAGNOSIS — M545 Low back pain, unspecified: Secondary | ICD-10-CM | POA: Insufficient documentation

## 2012-06-27 DIAGNOSIS — IMO0001 Reserved for inherently not codable concepts without codable children: Secondary | ICD-10-CM | POA: Insufficient documentation

## 2012-06-27 DIAGNOSIS — I10 Essential (primary) hypertension: Secondary | ICD-10-CM | POA: Insufficient documentation

## 2012-06-27 DIAGNOSIS — J4489 Other specified chronic obstructive pulmonary disease: Secondary | ICD-10-CM | POA: Insufficient documentation

## 2012-06-27 NOTE — Evaluation (Addendum)
Physical Therapy Evaluation  Patient Details  Name: [name] MRN: [id number] Date of Birth: 01/28/1936  Today's Date: 06/27/2012 Time: 1478-2956 PT Time Calculation (min): 47 min Charges: 1 eval, 10' self care Visit#: 1  of 8   Re-eval: 07/27/12 Assessment Diagnosis: Thoracic Pain Next MD Visit: Dr. Ethelene Hal - 07/14/12  Authorization: [facility] MEDICARE  Authorization Time Period:    Authorization Visit#: 1  of 10    Past Medical History:  Past Medical History  Diagnosis Date  . Insomnia   . HTN (hypertension)   . COPD (chronic obstructive pulmonary disease)   . Asthma   . NSAID-associated gastropathy 2007 ADVIL  . Prostate ca   . Skin cancer of face   . Colon polyps    Past Surgical History:  Past Surgical History  Procedure Date  . Esophagogastroduodenoscopy 12/2005  . Skin cancer excision     from left  face  . Upper gastrointestinal endoscopy 2007 DYSPEPSIA D50 V4    NSAID GASTRITIS  . Colonoscopy JUL 2007 D25 V1    Simple adenomas(RECTUM), HYPERPLASTIC COLON POLYPS, [place] TICS  . Colonoscopy 03/18/2011    Procedure: COLONOSCOPY;  Surgeon: Arlyce Harman, MD;  Location: [facility];  Service: Endoscopy;  Laterality: N/A;  8:30    Subjective Symptoms/Limitations Symptoms: PMH: chronic bronchritis, skin cancer of face, prostate cancer Pertinent History: Pt is referred to PT for thoracic pain which he reports started 10-15 years ago that started when working.  He reports that pain is mostly in his lower neck and shoulder region, but has pain from his "head to his toes" How long can you stand comfortably?: 15 minutes How long can you walk comfortably?: 15 minutes  Patient Stated Goals: Pt would like to ease the pain.  Pain Assessment Currently in Pain?: Yes Pain Score:   3 (Range: 3-10/10. ) Pain Location: Neck Pain Relieving Factors: ibuprofen PRN, heating pads.  Effect of Pain on Daily Activities: difficulty working on cars, enjoys reading and walking his dog.    Precautions/Restrictions   Hx of cancer  Prior Functioning  Home Living Lives With: Alone Prior Function Driving: Yes (difficulty turning neck to the Left) Vocation: Retired Comments: He enjoys working on cars and around his house.   Cognition/Observation Observation/Other Assessments Observations: blisters to back of neck due to hot pack burn at home.  Cervical spine with moderate swelling.  Other Assessments: max cueing to obtain appropriate sitting posture.  Impaired breathing pattern with shallow breaths   Sensation/Coordination/Flexibility/Functional Tests Coordination Gross Motor Movements are Fluid and Coordinated: No Coordination and Movement Description: requires max cueing for TrA, multiifidus, PF, MT and LT and for contraction Functional Tests Functional Tests: NDI: 38%  Assessment Cervical AROM Overall Cervical AROM Comments: pain with all motions w/most pain to L side Cervical Flexion: WNL Cervical Extension: 11 cm Cervical - Right Side Bend: 17 cm Cervical - Left Side Bend: 16 cm Cervical - Right Rotation: 16 cm Cervical - Left Rotation: 19 cm - most painful  Cervical Strength Overall Cervical Strength Comments: capital flexion: 3/5; shoulder strength WNL except: R LT: 3+/5 w/pain, R LT: 4/5 Cervical Flexion: 4/5 Cervical Extension: 4/5 Cervical - Right Side Bend: 3+/5 Cervical - Left Side Bend: 3+/5 Cervical - Right Rotation: 3+/5 Cervical - Left Rotation: 3+/5 Palpation Palpation: pain and tenderness to L thoracic erector spinae   Mobility/Balance  Ambulation/Gait Ambulation/Gait: Yes Gait Pattern: Antalgic Posture/Postural Control Posture/Postural Control: Postural limitations Postural Limitations: forward head with rounded shoulders.  Exercise/Treatments Prone Exercises Other Prone Exercise: POE: cervical rotation, scap retraction x5 each Supine Ab Set: Limitations AB Set Limitations: 3x10 sec holds w/max cueing for technique Other  Supine Lumbar Exercises: PF w/cueing for activation 2x10 sec holds Prone  Other Prone Lumbar Exercises: Mutlifidus w/max cueing for activation 1x10 sec hold  Physical Therapy Assessment and Plan PT Assessment and Plan Clinical Impression Statement: Pt is a 77 year old male referred to PT for thoracic pain with a significant hx of cervical and low back pain with chronic bronchitits.   Pt will benefit from skilled therapeutic intervention in order to improve on the following deficits: Impaired perceived functional ability;Decreased strength;Increased muscle spasms;Pain;Decreased coordination;Improper body mechanics PT Frequency: Min 2X/week PT Duration: 8 weeks PT Treatment/Interventions: Therapeutic activities;Therapeutic exercise;Neuromuscular re-education;Patient/family education;Manual techniques PT Plan: NO MODALITIES HX OF CANCER.  UBE (if able to tolerate with chronic bronchities), t-band activities, x-v, w-bacls, chin tucks, POE activities (rotation, flex/ext, SA), elbow corner press.  Continue to progress towards functional goals.     Goals Home Exercise Program Pt will Perform Home Exercise Program: Independently PT Goal: Perform Home Exercise Program - Progress: Goal set today PT Short Term Goals Time to Complete Short Term Goals: 3 weeks PT Short Term Goal 1: Pt will report pain less than 5/10 for 50% of his day for improved QOL.  PT Short Term Goal 2: Pt will improve scapular coordination and strength in order to obtain apporpriate sitting posture with minimal cueing.  PT Short Term Goal 3: Pt will improve cervical AROM to WNL.  PT Short Term Goal 4: Pt will present with decrease fascial restrictions and pain to thoracic region.  PT Short Term Goal 5: Pt will be independent with core coordination.  PT Long Term Goals Time to Complete Long Term Goals: 8 weeks PT Long Term Goal 1: Pt will report pain less than 4/10 for 75% of his day for improved QOL.  PT Long Term Goal 2: Pt will  score less than 20% of on the NDI for improved QOL.  Long Term Goal 3: Pt will improve core and scapular strength to WNL in order to present with normalized posture x5 minutes in sitting and standing with min. cueing.  Long Term Goal 4: Pt will be independently verbalize importance of posture to decrease secondary impairments.   Problem List Patient Active Problem List  Diagnosis  . HYPERTENSION  . ASTHMA  . COPD  . GASTRITIS, CHRONIC  . INSOMNIA  . DYSPNEA  . EPIGASTRIC PAIN  . COLONIC POLYPS, ADENOMATOUS, HX OF  . GASTROESOPHAGEAL REFLUX DISEASE, HX OF  . ABDOMINAL PAIN, HX OF  . Colon polyps  . Acute rhinitis  . Pain in thoracic spine  . Low back pain    PT - End of Session Activity Tolerance: Patient tolerated treatment well PT Plan of Care PT Home Exercise Plan: see scanned report PT Patient Instructions: importance of posture and core strength to decrease pain.  Consulted and Agree with Plan of Care: Patient  GP Functional Assessment Tool Used: NDI: 38% Functional Limitation: Self care Self Care Current Status (Z6109): At least 20 percent but less than 40 percent impaired, limited or restricted Self Care Goal Status (U0454): At least 1 percent but less than 20 percent impaired, limited or restricted  Caryl Manas, PT 06/27/2012, 2:21 PM  Physician Documentation Your signature is required to indicate approval of the treatment plan as stated above.  Please sign and either send electronically or make a copy of this  report for your files and return this physician signed original.   Please mark one 1.__approve of plan  2. ___approve of plan with the following conditions.   ______________________________                                                          _____________________ Physician Signature                                                                                                             Date

## 2012-06-29 ENCOUNTER — Ambulatory Visit (HOSPITAL_COMMUNITY)
Admission: RE | Admit: 2012-06-29 | Discharge: 2012-06-29 | Disposition: A | Discharge status: Home / Self Care | Payer: Medicare Other | Source: Ambulatory Visit | Attending: *Deleted | Admitting: *Deleted

## 2012-06-29 NOTE — Progress Notes (Signed)
Physical Therapy Treatment Patient Details  Name: [name] MRN: [id number] Date of Birth: 05/22/1936  Today's Date: 06/29/2012 Time: 0930-1013 PT Time Calculation (min): 43 min  Visit#: 2  of 8   Re-eval: 07/27/12    Authorization: [facility] MEDICARE  Authorization Time Period:    Authorization Visit#: 2  of 10    Subjective: Symptoms/Limitations Symptoms: Reports compliance with HEP and he feels about the same at night time.  Pain Assessment Currently in Pain?: No/denies  Precautions/Restrictions     Exercise/Treatments Machines for Strengthening UBE (Upper Arm Bike): 3 min backwards  Theraband Exercises Scapula Retraction: 10 reps;Green Shoulder Extension: 10 reps;Green Rows: 10 reps;Green Shoulder External Rotation: 10 reps;Green (BUE) Standing Exercises Upper Extremity Flexion with Stabilization: Flexion;10 reps Other Standing Exercises: Pro/Ret/Ele/Dep Supine Exercises Capital Flexion: 5 reps;5 secs Upper Extremity Flexion with Stabilization: Flexion;Limitations;10 reps UE Flexion with Stabilization Limitations: 1# dowel rod Other Supine Exercise: Shoulder ER x15 reps w/2# Prone Exercises Shoulder Extension: 10 reps Rows: 10 reps Upper Extremity Flexion with Stabilization: 5 reps Other Prone Exercise: POE: cervical rotation x5, flexion/extension x5, scap retraction x10, SA x10 w/TC for scap depression Other Prone Exercise: shoulder abduction 5 reps  Manual Therapy Manual Therapy: Joint mobilization Joint Mobilization: PRONE: Grade II-IV to C6-T5 PA to SP and R TP, w/STM after to decrease fascial restrictions to R thoracic scapular region. x10 min  Physical Therapy Assessment and Plan PT Assessment and Plan Clinical Impression Statement: Pt able to complete all exercises without increased pain today.  Had notable decrease in fascial restrictions to R thoracic region after manual techniques.  PT Plan: NO MODALITIES HX OF CANCER. Add:  x-v, w-bacls, chin tucks,  elbow corner press.  Continue to progress towards functional goals to decrease pain and improve cervical ROM.  Continue to progress low back and core coordination and strength     Goals    Problem List Patient Active Problem List  Diagnosis  . HYPERTENSION  . ASTHMA  . COPD  . GASTRITIS, CHRONIC  . INSOMNIA  . DYSPNEA  . EPIGASTRIC PAIN  . COLONIC POLYPS, ADENOMATOUS, HX OF  . GASTROESOPHAGEAL REFLUX DISEASE, HX OF  . ABDOMINAL PAIN, HX OF  . Colon polyps  . Acute rhinitis  . Pain in thoracic spine  . Low back pain    PT - End of Session Activity Tolerance: Patient tolerated treatment well PT Plan of Care PT Patient Instructions: importance of posture Consulted and Agree with Plan of Care: Patient  GP Functional Assessment Tool Used: NDI: 38%  Yidel Teuscher 06/29/2012, 10:17 AM

## 2012-07-02 NOTE — Assessment & Plan Note (Signed)
X-ray changes in lung bases look like atelectasis perhaps with secretion retention to go along with this bronchitis/bronchiectasis pattern. He is symptomatically improved. I don't have changes to offer. He understands to call for earlier return as needed

## 2012-07-05 ENCOUNTER — Ambulatory Visit (HOSPITAL_COMMUNITY)
Admission: RE | Admit: 2012-07-05 | Discharge: 2012-07-05 | Disposition: A | Discharge status: Home / Self Care | Payer: Medicare Other | Source: Ambulatory Visit | Attending: *Deleted | Admitting: *Deleted

## 2012-07-05 NOTE — Progress Notes (Signed)
Physical Therapy Treatment Patient Details  Name: [name] MRN: [id number] Date of Birth: [birth year]  Today's Date: 07/05/2012 Time: 3086-5784 PT Time Calculation (min): 42 min  Visit#: 3  of 8   Re-eval: 07/27/12 Authorization: [employer] MEDICARE  Authorization Visit#: 3  of 10   Charges:  therex 28', massage 12'  Subjective: Symptoms/Limitations Symptoms: Pt. states his upper back feels the same as last visit.  Pain Assessment Currently in Pain?: Yes Pain Score:   3 Pain Location: Thoracic Pain Orientation: Lateral;Left;Right   Exercise/Treatments Machines for Strengthening UBE (Upper Arm Bike): 4 min backwards  Theraband Exercises Scapula Retraction: 10 reps;Green Shoulder Extension: 10 reps;Green Rows: 10 reps;Green Shoulder External Rotation: 10 reps;Green Standing Exercises Upper Extremity Flexion with Stabilization: Flexion;10 reps Other Standing Exercises: Pro/Ret, Ele/Dep 10 reps each against wall Other Standing Exercises: corner elbow presses 10 reps X 5" holds Supine Exercises Capital Flexion: 5 reps;5 secs Upper Extremity Flexion with Stabilization: Flexion;Limitations;10 reps UE Flexion with Stabilization Limitations: 2# dowel rod Other Supine Exercise: protraction 2# wand   Manual Therapy Manual Therapy: Massage Joint Mobilization: PRONE:  B thoracic/scap and mid trap region X 12'  Physical Therapy Assessment and Plan PT Assessment and Plan Clinical Impression Statement: Added new exercises per PT POC.  Pt. requires multimodal cues due to poor core stability and overall postural instability.  Pt. reported reduction of symptoms following STM, however reported neck felt stiff.  PT Plan: NO MODALITIES HX OF CANCER. Add:  x-v, w-backs, and chin tucks in seated position.  Resume prone exercises.Continue to progress toward functional goals to decrease pain and improve cervical ROM.      Problem List Patient Active Problem List  Diagnosis  . HYPERTENSION    . ASTHMA  . COPD with chronic bronchitis/probable bronchiectasis  . GASTRITIS, CHRONIC  . INSOMNIA  . DYSPNEA  . EPIGASTRIC PAIN  . COLONIC POLYPS, ADENOMATOUS, HX OF  . GASTROESOPHAGEAL REFLUX DISEASE, HX OF  . ABDOMINAL PAIN, HX OF  . Colon polyps  . Acute rhinitis  . Pain in thoracic spine  . Low back pain    PT - End of Session Activity Tolerance: Patient tolerated treatment well General Behavior During Session: [facility] for tasks performed Cognition: [facility] for tasks performed PT Plan of Care PT Patient Instructions: importance of posture Consulted and Agree with Plan of Care: Patient   Lurena Nida, PTA/CLT 07/05/2012, 10:19 AM

## 2012-07-07 ENCOUNTER — Ambulatory Visit (HOSPITAL_COMMUNITY)
Admission: RE | Admit: 2012-07-07 | Discharge: 2012-07-07 | Disposition: A | Discharge status: Home / Self Care | Payer: Medicare Other | Source: Ambulatory Visit | Attending: *Deleted | Admitting: *Deleted

## 2012-07-07 NOTE — Progress Notes (Signed)
Physical Therapy Treatment Patient Details  Name: [name] MRN: [id number] Date of Birth: 05/29/1936  Today's Date: 07/07/2012 Time: 0920-1010 PT Time Calculation (min): 50 min  Visit#: 4  of 8   Re-eval: 07/27/12  Charge: therex 38', massage 12'  Authorization: [employer] Medicare  Authorization Time Period:    Authorization Visit#: 4  of 10    Subjective: Symptoms/Limitations Symptoms: No upper back pain today, most c/o on my tail bone. Pain Assessment Currently in Pain?: No/denies  Objective:  Exercise/Treatments Machines for Strengthening UBE (Upper Arm Bike): 4 min backwards  Theraband Exercises Scapula Retraction: 10 reps;Green Shoulder Extension: 10 reps;Green Rows: 10 reps;Green Shoulder External Rotation: 10 reps;Green Standing Exercises Upper Extremity Flexion with Stabilization: Flexion;10 reps Other Standing Exercises: Pro/Ret, Ele/Dep 10 reps each against wall Other Standing Exercises: corner elbow presses 10 reps X 10" holds Seated Exercises Cervical Rotation: 10 reps X to V: 10 reps W Back: 10 reps Prone Exercises Neck Retraction: 10 reps;5 secs Shoulder Extension: 10 reps Rows: 10 reps Other Prone Exercise: POE: cervical rotation x10, scap retraction x10, SA x10 w/TC for scap depression Other Prone Exercise: horizontal abduction 10x   Manual Therapy Manual Therapy: Massage Massage: PRONE: B thoracic/scap and mid trap region x 12'  Physical Therapy Assessment and Plan PT Assessment and Plan Clinical Impression Statement: Began seated postural strengthening exercises, pt continues to require multimodal cueing for proper form, technique and manual facilitation for correct musculature activation due to poor core stability, coordination, and overall postural instability..  Pt educated on importance of posture with all standing and seated activities for maximum benefits.  Able to reduce spasms mid trap and scapular region with STM at end of session. PT  Plan: NO MODALITIES HX OF CANCER.  Continue to progress towards functional goals to improve posture and cervical ROM and reduce pain.    Goals    Problem List Patient Active Problem List  Diagnosis  . HYPERTENSION  . ASTHMA  . COPD with chronic bronchitis/probable bronchiectasis  . GASTRITIS, CHRONIC  . INSOMNIA  . DYSPNEA  . EPIGASTRIC PAIN  . COLONIC POLYPS, ADENOMATOUS, HX OF  . GASTROESOPHAGEAL REFLUX DISEASE, HX OF  . ABDOMINAL PAIN, HX OF  . Colon polyps  . Acute rhinitis  . Pain in thoracic spine  . Low back pain    PT - End of Session Activity Tolerance: Patient tolerated treatment well General Behavior During Session: [facility] for tasks performed Cognition: [facility] for tasks performed  GP    Juel Burrow 07/07/2012, 11:07 AM

## 2012-07-12 ENCOUNTER — Ambulatory Visit (HOSPITAL_COMMUNITY)
Admission: RE | Admit: 2012-07-12 | Discharge: 2012-07-12 | Disposition: A | Discharge status: Home / Self Care | Payer: Medicare Other | Source: Ambulatory Visit | Attending: *Deleted | Admitting: *Deleted

## 2012-07-12 NOTE — Progress Notes (Signed)
Physical Therapy Treatment Patient Details  Name: [name] MRN: [id number] Date of Birth: [birth year]  Today's Date: 07/12/2012 Time: 0932-1018 PT Time Calculation (min): 46 min  Visit#: 5  of 8   Re-eval: 07/27/12 Authorization: [facility] Medicare  Authorization Visit#: 5  of 10   Charges:  therex 32', massage 12'  Subjective: Symptoms/Limitations Symptoms: Pt. states he mostly has pain when he's working on his truck; was on his knees fixing the brakes and his upper back bothered him.  Neck is dong pretty good.  Suggested pt. not over-do when working on his cars and avoid awkward, prolonged postitions.  Currently with 3/10 pain. Pain Assessment Currently in Pain?: Yes Pain Score:   3 Pain Location: Thoracic   Exercise/Treatments Stretches Corner Stretch: 3 reps;20 seconds Machines for Strengthening UBE (Upper Arm Bike): 4 min backwards  Theraband Exercises Scapula Retraction: 15 reps;Green Shoulder Extension: 15 reps;Green Rows: 15 reps;Green Shoulder External Rotation: 15 reps;Green Standing Exercises Upper Extremity Flexion with Stabilization: Flexion;10 reps Other Standing Exercises: Pro/Ret, Ele/Dep 10 reps each against wall Other Standing Exercises: corner elbow presses 10 reps X 10" holds Seated Exercises Cervical Rotation: 10 reps X to V: 10 reps W Back: 10 reps Shoulder Shrugs: 10 reps Prone Exercises Neck Retraction: 10 reps;5 secs Shoulder Extension: 10 reps Rows: 10 reps Upper Extremity Flexion with Stabilization: 5 reps Other Prone Exercise: POE: cervical rotation x10, scap retraction x10, SA x10 w/TC for scap depression Other Prone Exercise: horizontal abduction 10x    Manual Therapy Manual Therapy: Massage Massage: PRONE: B thoracic/scap and mid trap region X 12'   Physical Therapy Assessment and Plan PT Assessment and Plan Clinical Impression Statement: Able to increase reps today.  Added corner stretch for chest and continued STM for thoracic mm.   Pt. with noted tightness. Multiple spasms in B paraspinal mm but able to resolve with massage. PT Plan: NO MODALITIES HX OF CANCER.  Continue to progress towards functional goals to improve posture and cervical ROM and reduce pain.     Problem List Patient Active Problem List  Diagnosis  . HYPERTENSION  . ASTHMA  . COPD with chronic bronchitis/probable bronchiectasis  . GASTRITIS, CHRONIC  . INSOMNIA  . DYSPNEA  . EPIGASTRIC PAIN  . COLONIC POLYPS, ADENOMATOUS, HX OF  . GASTROESOPHAGEAL REFLUX DISEASE, HX OF  . ABDOMINAL PAIN, HX OF  . Colon polyps  . Acute rhinitis  . Pain in thoracic spine  . Low back pain    PT - End of Session Activity Tolerance: Patient tolerated treatment well General Behavior During Session: [facility] for tasks performed Cognition: [facility] for tasks performed   Lurena Nida, PTA/CLT 07/12/2012, 10:24 AM

## 2012-07-13 ENCOUNTER — Ambulatory Visit (HOSPITAL_COMMUNITY)
Admission: RE | Admit: 2012-07-13 | Discharge: 2012-07-13 | Disposition: A | Discharge status: Home / Self Care | Payer: Medicare Other | Source: Ambulatory Visit | Attending: *Deleted | Admitting: *Deleted

## 2012-07-13 NOTE — Progress Notes (Signed)
Physical Therapy Treatment Patient Details  Name: [name] MRN: [id number] Date of Birth: 1935/12/09  Today's Date: 07/13/2012 Time: 4098-1191 PT Time Calculation (min): 41 min Charges: 10' Manual, 16' NMR, 15' TE Visit#: 6  of 8   Re-eval: 07/27/12    Authorization: [employer] Medicare  Authorization Time Period:    Authorization Visit#: 6  of 10    Subjective:    Precautions/Restrictions     Exercise/Treatments Mobility/Balance        Administrator, [employer] for [employer], production Scapula Retraction: 10 reps;Blue (TC for TrA activation) Shoulder Extension: 10 reps;Blue (TC for TrA activation) Rows: 10 reps;Blue (TC for TrA activation) Shoulder External Rotation: 10 reps;Blue (TC for TrA activation) Supine Exercises Other Supine Exercise: laying on towel roll w/angel wings w/palms in neutral Sidelying Exercises   Prone Exercises   Hand Exercises for Cervical Radiculopathy    Stretches Quadruped Mid Back Stretch: Limitations Quadruped Mid Back Stretch Limitations: Quadriceps stretch 3x30 sec w/manual assistance Aerobic   Machines for Strengthening   Standing   Seated   Supine   Sidelying Hip Abduction: 10 reps Prone  Single Arm Raise: Right;Left;10 reps Straight Leg Raise: 10 reps Opposite Arm/Leg Raise: Right arm/Left leg;Left arm/Right leg;10 reps Other Prone Lumbar Exercises: Mutlifidus w/max cueing for activation 5x10 sec hold Other Prone Lumbar Exercises: PF w/cueing for activation 10x5 sec holds Quadruped    Manual Therapy Joint Mobilization: PRONE: Grade II-IV to T10-L2 SP w/STM after x10 minutes  Physical Therapy Assessment and Plan PT Assessment and Plan Clinical Impression Statement: Added lumbar activities today to decrease low back pain and improve core strength, especially multifdus.  Pt had resolved pain at end of session. Continues to have significant difficulty with coordination to core musculature, especially  multifidus.    PT Plan: NO MODALITIES HX OF CANCER.  Continue to progress towards functional goals to improve posture and cervical ROM, lumbar ROM and strength and reduce pain.    Goals Home Exercise Program Pt will Perform Home Exercise Program: Independently PT Short Term Goals Time to Complete Short Term Goals: 3 weeks PT Short Term Goal 1: Pt will report pain less than 5/10 for 50% of his day for improved QOL.  PT Short Term Goal 1 - Progress: Progressing toward goal PT Short Term Goal 2: Pt will improve scapular coordination and strength in order to obtain apporpriate sitting posture with minimal cueing.  PT Short Term Goal 2 - Progress: Progressing toward goal PT Short Term Goal 3: Pt will improve cervical AROM to WNL.  PT Short Term Goal 3 - Progress: Progressing toward goal PT Short Term Goal 4: Pt will present with decrease fascial restrictions and pain to thoracic region.  PT Short Term Goal 4 - Progress: Progressing toward goal PT Short Term Goal 5: Pt will be independent with core coordination.  PT Short Term Goal 5 - Progress: Progressing toward goal PT Long Term Goals Time to Complete Long Term Goals: 8 weeks PT Long Term Goal 1: Pt will report pain less than 4/10 for 75% of his day for improved QOL.  PT Long Term Goal 1 - Progress: Progressing toward goal PT Long Term Goal 2: Pt will score less than 20% of on the NDI for improved QOL.  PT Long Term Goal 2 - Progress: Progressing toward goal Long Term Goal 3: Pt will improve core and scapular strength to WNL in order to present with normalized posture x5 minutes in sitting and standing with  min. cueing.  Long Term Goal 3 Progress: Progressing toward goal Long Term Goal 4: Pt will be independently verbalize importance of posture to decrease secondary impairments.  Long Term Goal 4 Progress: Progressing toward goal  Problem List Patient Active Problem List  Diagnosis  . HYPERTENSION  . ASTHMA  . COPD with chronic  bronchitis/probable bronchiectasis  . GASTRITIS, CHRONIC  . INSOMNIA  . DYSPNEA  . EPIGASTRIC PAIN  . COLONIC POLYPS, ADENOMATOUS, HX OF  . GASTROESOPHAGEAL REFLUX DISEASE, HX OF  . ABDOMINAL PAIN, HX OF  . Colon polyps  . Acute rhinitis  . Pain in thoracic spine  . Low back pain    PT - End of Session Activity Tolerance: Patient tolerated treatment well General Behavior During Session: [facility] for tasks performed Cognition: [facility] for tasks performed  GP    Sharrell Krawiec 07/13/2012, 10:21 AM

## 2012-07-14 ENCOUNTER — Ambulatory Visit (HOSPITAL_COMMUNITY): Payer: Medicare Other

## 2012-07-19 ENCOUNTER — Ambulatory Visit (HOSPITAL_COMMUNITY)
Admission: RE | Admit: 2012-07-19 | Discharge: 2012-07-19 | Disposition: A | Discharge status: Home / Self Care | Payer: Medicare Other | Source: Ambulatory Visit | Attending: *Deleted | Admitting: *Deleted

## 2012-07-19 NOTE — Progress Notes (Signed)
Physical Therapy Treatment Patient Details  Name: MIQUEL STACKS MRN: [id number] Date of Birth: 1935-11-10  Today's Date: 07/19/2012 Time: 0928-1020 PT Time Calculation (min): 52 min  Visit#: 7  of 8   Re-eval: 07/27/12  Charge: therex 40', manual 8'  Authorization: [employer]  Authorization Visit#: 7  of 10    Subjective: Symptoms/Limitations Symptoms: I was stiff this morning, no real pain. Pain Assessment Currently in Pain?: No/denies  Objective:   Exercise/Treatments Theraband Exercises Scapula Retraction: 10 reps;Blue;Other (comment) (TC for TrA activation) Shoulder Extension: 10 reps;Blue;Other (comment) (TC for TrA activation) Rows: 10 reps;Blue;Other (comment) (TC for TrA activation) Standing Exercises Other Standing Exercises: Angel wings w/ palms in neutral 5x 10"  Seated Exercises W Back: Limitations W Back Limitations: reviewed wback for proper technique with HEP exercise Supine Exercises Other Supine Exercise: laying on towel roll w/angel wings w/palms in neutral Prone Exercises Neck Retraction: 10 reps;5 secs W Back: 10 reps Shoulder Extension: 10 reps Rows: 10 reps Stretches Lower Trunk Rotation: 5 reps;20 seconds Quadruped Mid Back Stretch: Limitations Quadruped Mid Back Stretch Limitations: Quadriceps stretch 3x30 sec w/manual assistance Standing Functional Squats: 15 reps;Limitations Functional Squats Limitations: proper lifting with orange ball Sidelying Hip Abduction: 10 reps Prone  Single Arm Raise: Right;Left;10 reps Straight Leg Raise: 10 reps Opposite Arm/Leg Raise: Right arm/Left leg;Left arm/Right leg;10 reps Other Prone Lumbar Exercises: Mutlifidus w/max cueing for activation 5x10 sec hold Quadruped Other Quadruped Lumbar Exercises: child's pose 3x 30"  Manual Therapy Manual Therapy: Massage Massage: PRONE: B thoracic/scap and mid trap region   Physical Therapy Assessment and Plan PT Assessment and Plan Clinical Impression  Statement: Added squats to improve functional strengthening for proper lifting with cueing for form.  Added stretches to improve lumbar ROM.  Pt continues to demonstrate impaired coordination required multimodal cueing for core musclature activtion, especially multifidus. PT Plan: NO MODALITIES HX OF CANCER. Continue to progress towards functional goals to improve posture and cervical ROM, lumbar ROM and strength and reduce pain.     Goals    Problem List Patient Active Problem List  Diagnosis  . HYPERTENSION  . ASTHMA  . COPD with chronic bronchitis/probable bronchiectasis  . GASTRITIS, CHRONIC  . INSOMNIA  . DYSPNEA  . EPIGASTRIC PAIN  . COLONIC POLYPS, ADENOMATOUS, HX OF  . GASTROESOPHAGEAL REFLUX DISEASE, HX OF  . ABDOMINAL PAIN, HX OF  . Colon polyps  . Acute rhinitis  . Pain in thoracic spine  . Low back pain    PT - End of Session Activity Tolerance: Patient tolerated treatment well General Behavior During Session: [facility] for tasks performed Cognition: [facility] for tasks performed  GP    Juel Burrow 07/19/2012, 12:02 PM

## 2012-07-21 ENCOUNTER — Ambulatory Visit (HOSPITAL_COMMUNITY)
Admission: RE | Admit: 2012-07-21 | Discharge: 2012-07-21 | Disposition: A | Discharge status: Home / Self Care | Payer: Medicare Other | Source: Ambulatory Visit | Attending: *Deleted | Admitting: *Deleted

## 2012-07-21 NOTE — Evaluation (Signed)
Physical Therapy Discharge  Patient Details  Name: [name] MRN: [id number] Date of Birth: 1935/10/29  Today's Date: 07/21/2012 Time: 0912-0955 PT Time Calculation (min): 43 min Charges: 1 ROM, 1 MMT, 28' TE, 10' Self Care Visit#: 8  of 8   Re-eval: 07/27/12 Assessment Diagnosis: Thoracic Pain Next MD Visit: Dr. Ethelene Hal - 07/31/12  Authorization: [facility] Medicare  Authorization Time Period:    Authorization Visit#: 8  of 10    Subjective Symptoms/Limitations Symptoms: Pt reports continues to have stiffness, no pain. Has not continued working due to cold weather. He is completing his HEP 1x a day in the morning.  States he wishes to be D/C secondary to monetary constraints.  Pain Assessment Currently in Pain?: No/denies  Precautions/Restrictions  Precautions Precaution Comments: Hx of cancer  Assessment Cervical AROM Cervical Flexion: WNL (was WNL) Cervical Extension: 11 (was 11 cm) Cervical - Right Side Bend: 14 cm (was 17 cm) Cervical - Left Side Bend: 14 cm (was 16 cm) Cervical - Right Rotation: 14.5 cm (was 16 cm) Cervical - Left Rotation: 14.5 cm - most stiffness (was 19 cm most painful) Cervical Strength Cervical Flexion: 5/5 (was 4/5) Cervical Extension: 5/5 (was 4/5) Cervical - Right Side Bend: 4/5 (was 3+/5) Cervical - Left Side Bend: 5/5(was 3+/5) Cervical - Right Rotation: 5/5 (was 3+/5) Cervical - Left Rotation: 5/5 (was 3+/5) Palpation Palpation: mild facial restrictions to L UT region w/o pain  Exercise/Treatments Stretches Upper Trapezius Stretch: 30 seconds;2 reps (BUE) Levator Stretch: 2 reps;30 seconds Machines for Strengthening UBE (Upper Arm Bike): 5 min backwards for postural strength Theraband Exercises Scapula Retraction: 10 reps;Green Shoulder Extension: 15 reps;Green Rows: 15 reps;Green Seated Exercises Cervical Rotation: 5 reps X to V: 10 reps Shoulder Rolls: Backwards;20 reps Shoulder Flexion: Both;10 reps (alternating) Prone  Exercises W Back: 10 reps Rows: 15 reps Upper Extremity Flexion with Stabilization: 10 reps Other Prone Exercise: POE: cervical rotation x10, scap retraction x10, SA x10 w/TC for scap depression  Physical Therapy Assessment and Plan PT Assessment and Plan Clinical Impression Statement: Mr. Pichon has attended 8 OP PT visits to address thoracic pain with the following findings: met all STG and 3/4 LTG (NDI not taken today).  He has improved body awareness of posture and scapular strength, decreased fascial restrictions and significant improvement in cervical ROM.  He continues to be limited by posture, however requires only minimal cueing to correct and has decreased PA mobility to lower cervical and upper thoracic region.  Pt plans to have an MRI completed to his neck this Saturday.  His pain on average is 2-3/10 and realizes most is likely due to his bone spurs.  At this time pt wishes to be d/c from PT secondary to financial constraints.  PT Plan: D/C    Goals Home Exercise Program Pt will Perform Home Exercise Program: Independently PT Short Term Goals Time to Complete Short Term Goals: 3 weeks PT Short Term Goal 1: Pt will report pain less than 5/10 for 50% of his day for improved QOL.  PT Short Term Goal 1 - Progress: Met PT Short Term Goal 2: Pt will improve scapular coordination and strength in order to obtain apporpriate sitting posture with minimal cueing.  PT Short Term Goal 2 - Progress: Met PT Short Term Goal 3: Pt will improve cervical AROM to WNL.  PT Short Term Goal 3 - Progress: Met PT Short Term Goal 4: Pt will present with decrease fascial restrictions and pain to thoracic region.  PT Short Term Goal 4 - Progress: Met PT Short Term Goal 5: Pt will be independent with core coordination.  PT Short Term Goal 5 - Progress: Met PT Long Term Goals Time to Complete Long Term Goals: 8 weeks PT Long Term Goal 1: Pt will report pain less than 4/10 for 75% of his day for improved  QOL.  PT Long Term Goal 1 - Progress: Met PT Long Term Goal 2: Pt will score less than 20% of on the NDI for improved QOL.  PT Long Term Goal 2 - Progress: Progressing toward goal Long Term Goal 3: Pt will improve core and scapular strength to WNL in order to present with normalized posture x5 minutes in sitting and standing with min. cueing.  Long Term Goal 3 Progress: Met Long Term Goal 4: Pt will be independently verbalize importance of posture to decrease secondary impairments.  Long Term Goal 4 Progress: Met  Problem List Patient Active Problem List  Diagnosis  . HYPERTENSION  . ASTHMA  . COPD with chronic bronchitis/probable bronchiectasis  . GASTRITIS, CHRONIC  . INSOMNIA  . DYSPNEA  . EPIGASTRIC PAIN  . COLONIC POLYPS, ADENOMATOUS, HX OF  . GASTROESOPHAGEAL REFLUX DISEASE, HX OF  . ABDOMINAL PAIN, HX OF  . Colon polyps  . Acute rhinitis  . Pain in thoracic spine  . Low back pain    PT - End of Session Activity Tolerance: Patient tolerated treatment well General Behavior During Session: [facility] for tasks performed Cognition: [facility] for tasks performed PT Plan of Care PT Home Exercise Plan: updated with advanced HEP and provided pt with green tband.  Consulted and Agree with Plan of Care: Patient  GP Functional Assessment Tool Used: clinical observation  Functional Limitation: Self care Self Care Goal Status ([id number]): At least 1 percent but less than 20 percent impaired, limited or restricted Self Care Discharge Status [phone]): At least 1 percent but less than 20 percent impaired, limited or restricted  Annelisa Ryback, PT 07/21/2012, 10:12 AM  Physician Documentation Your signature is required to indicate approval of the treatment plan as stated above.  Please sign and either send electronically or make a copy of this report for your files and return this physician signed original.   Please mark one 1.__approve of plan  2. ___approve of plan with the following  conditions.   ______________________________                                                          _____________________ Physician Signature                                                                                                             Date

## 2012-12-19 ENCOUNTER — Ambulatory Visit (INDEPENDENT_AMBULATORY_CARE_PROVIDER_SITE_OTHER): Payer: Medicare Other | Admitting: Internal Medicine

## 2012-12-19 ENCOUNTER — Encounter: Payer: Self-pay | Admitting: Internal Medicine

## 2012-12-19 ENCOUNTER — Ambulatory Visit (INDEPENDENT_AMBULATORY_CARE_PROVIDER_SITE_OTHER)
Admission: RE | Admit: 2012-12-19 | Discharge: 2012-12-19 | Disposition: A | Discharge status: Home / Self Care | Payer: Medicare Other | Source: Ambulatory Visit | Attending: Internal Medicine | Admitting: Internal Medicine

## 2012-12-19 VITALS — BP 122/60 | HR 77 | Ht 68.75 in | Wt 185.4 lb

## 2012-12-19 DIAGNOSIS — G4700 Insomnia, unspecified: Secondary | ICD-10-CM

## 2012-12-19 DIAGNOSIS — J40 Bronchitis, not specified as acute or chronic: Secondary | ICD-10-CM

## 2012-12-19 DIAGNOSIS — J479 Bronchiectasis, uncomplicated: Secondary | ICD-10-CM

## 2012-12-19 DIAGNOSIS — J449 Chronic obstructive pulmonary disease, unspecified: Secondary | ICD-10-CM

## 2012-12-19 MED ORDER — FLUTTER DEVI: Status: DC

## 2012-12-19 NOTE — Progress Notes (Signed)
04/09/11-77 year old male former smoker followed for asthma/COPD, insomnia, complicated by HBP, chronic gastritis, GERD Last here 04/11/2010 Has had flu vaccine He reports increased sinus drainage in April. He saw an ENT doctor in [place] at [facility] who did a CT. That problem has been doing better. He has persistent sense of chest congestion especially in the right lung. Productive cough affects his sleep. Coughing yellow or sometimes darker sputum. He denies reflux, fever, swollen glands or blood. Weight is down 7 or 8 pounds. Poor sense of smell has decreased his appetite. The [facility] has also treated his bronchitis/COPD with prednisone and antibiotic without help. He has taken prednisone tapers twice and antibiotics twice. None of this has made a big difference in his cough.  05/18/11- 77 year old male former smoker followed for asthma/COPD, insomnia, complicated by HBP, chronic gastritis, GERD At last visit he needed a nebulizer treatment and Depo-Medrol, and we had recommended saline nasal lavage. These did well. He says now that he is "better than I was, just comes and goes now". Sputum had grown a pneumococcus strongly resistant to penicillin and cephalosporins. We had sent Levaquin x10 days. -he just finished this morning. Walking on a treadmill helps him loosen the sputum. He has had pneumonia vaccine in 1984 and 1994. He says "Mucinex tears my lungs up". CBC 04/09/2011-WBC 8500 Chest x-ray: 04/09/2011-COPD without acute process.  08/18/11- 77 year old male former smoker followed for asthma/COPD, insomnia, complicated by HBP, chronic gastritis, GERD [name] overstimulated and made him jittery. He is trying to take one half pill daily because it did reduce his mucus production. Cough with white sputum. Reduced dose did not help his nervousness. Some tachycardia palpitation. Weight down 7 or 8 pounds. Blames [name] for increased reflux. Exercising on treadmill 20 minutes daily. Gets  shifting pains around anterior chest up into the shoulders, not clearly related to activity or exertion.  11/17/11- 77 year old male former smoker followed for asthma/COPD, insomnia, complicated by HBP, chronic gastritis, GERD Sob,wheezing upon activity and weather.  He quit [name] because it was making him depressed. Otherwise says he is "pretty good". Chest pain and tightness have improved. Sinus drainage stopped with over-the-counter antihistamines. He is walking 2 miles each morning and 1 mild each evening. Taking Benadryl at bedtime. CAT COPD score 29. CXR 04/13/11- Reviewed: IMPRESSION:  Chronic obstructive pulmonary disease without acute superimposed  process.  Original Report Authenticated By: Gerrianne Scale, M.D.   03/21/12- 77 year old male former smoker followed for chronic bronchitis/COPD, insomnia, complicated by HBP, chronic gastritis, GERD Productive cough-yellow to green in color; wheezing, SOB, congestion-chest only(feels like Right side mainly) COPD assessment test (CABG) score 35/40 Sensitive to temperature and weather changes triggering cough. Sputum is chronic, usually yellow green with no blood. He did not tolerate [name]  06/20/12- 77 year old male former smoker followed for chronic bronchitis/COPD, insomnia, complicated by HBP, chronic gastritis, GERD FOLLOWS FOR: breathing is doing fine. denies chest pain, chest tightness, increased SOB. Feels "fine today". Antibiotic cleared infection. On prednisone taper for cervical arthritis. CXR 03/25/12-  IMPRESSION:  1. Bibasilar opacities are new from previous exam and may  represent atelectasis or infiltrate.  Original Report Authenticated By: Rosealee Albee, M.D.   12/19/12- 77 year old male former smoker followed for chronic bronchitis/COPD, insomnia, complicated by HBP, chronic gastritis, GERD FOLLOWS FOR: heat and humidity making patient more SOB and full of congestion; does better in cooler temps Steroid  injection for back pain did not help. Cough remains blurry productive especially when lying down. Chronic insomnia  better with clonazepam.  ROS-See HPI Constitutional:   No-   weight loss, night sweats, fevers, chills, fatigue, lassitude. HEENT:   No-  headaches, difficulty swallowing, tooth/dental problems, sore throat,       No-  sneezing, itching, ear ache,     no current- nasal congestion, post nasal drip,  CV:  + chest pain, orthopnea, PND, swelling in lower extremities, anasarca, izziness, palpitations Resp: No- acute shortness of breath with exertion or at rest.              +Persistent  productive cough,  No non-productive cough,  No- coughing up of blood.              +   change in color of mucus.  No- wheezing.   Skin: No-   rash or lesions. GI: + heartburn, indigestion,  No-abdominal pain, nausea, vomiting,  GU:  MS:  No-   joint pain or swelling.  No- decreased range of motion.  No- back pain. Neuro-     nothing unusual Psych:  No- change in mood or affect. No depression or anxiety.  No memory loss.  OBJ General- Alert, Oriented, Affect-appropriate, Distress- none acute; overweight Skin- rash-none, lesions- none, excoriation- none Lymphadenopathy- none Head- atraumatic            Eyes- Gross vision intact, PERRLA, conjunctivae clear secretions            Ears- Hearing, canals-normal            Nose- Clear, no-Septal dev, mucus, polyps, erosion, perforation             Throat- Mallampati III , mucosa cobblestoned , drainage- none, tonsils- atrophic Neck- flexible , trachea midline, no stridor , thyroid nl, carotid no bruit Chest - symmetrical excursion , unlabored           Heart/CV- RRR , no murmur , no gallop  , no rub, nl s1 s2                           - JVD- none , edema- none, stasis changes- none, varices- none           Lung-+ trace rhonchi, Unlabored , dullness-none, rub- none           Chest wall-  Abd-  Br/ Gen/ Rectal- Not done, not indicated Extrem-  cyanosis- none, clubbing, none, atrophy- none, strength- nl Neuro- grossly intact to observation

## 2012-12-19 NOTE — Patient Instructions (Addendum)
Use Flutter device to help clear your chest     Blow through 4 times, 2 or 3 times daily when needed  Order CXR      Dx chronic bronchitis, bronchiectasis

## 2012-12-28 ENCOUNTER — Telehealth: Payer: Self-pay | Admitting: Internal Medicine

## 2012-12-28 NOTE — Progress Notes (Signed)
Quick Note:  LMTCB ______ 

## 2012-12-28 NOTE — Telephone Encounter (Signed)
Notes Recorded by Waymon Budge, MD on 12/19/2012 at 12:43 PM CXR- scarring in both lung bases, but no new process seen.   lmtcb x1

## 2012-12-29 NOTE — Telephone Encounter (Signed)
ATC patient no answer [id number] 

## 2012-12-29 NOTE — Telephone Encounter (Signed)
Returning call can be reached at [phone].[name]

## 2012-12-29 NOTE — Telephone Encounter (Signed)
Pt is aware of CXR results. He states from now on, we can leave detailed information on his voicemail that we won't have to play phone tag.

## 2012-12-30 NOTE — Progress Notes (Signed)
Quick Note:  Pt aware of results. ______ 

## 2012-12-30 NOTE — Progress Notes (Signed)
Quick Note:  LMTCB ______ 

## 2013-01-01 NOTE — Assessment & Plan Note (Signed)
We discussed pulmonary toilet and use of Flutter device

## 2013-01-01 NOTE — Assessment & Plan Note (Signed)
Continues clonazepam. Good sleep habits discussed.

## 2013-04-21 ENCOUNTER — Telehealth: Payer: Self-pay | Admitting: Internal Medicine

## 2013-04-21 MED ORDER — AMOXICILLIN-POT CLAVULANATE 875-125 MG PO TABS: 1.0000 | ORAL_TABLET | Freq: Two times a day (BID) | ORAL | Status: DC

## 2013-04-21 NOTE — Telephone Encounter (Signed)
Pt c/o increased chest congestion with white-Gertrue Willette mucus, prod cough, wheezing, chest tx and SOB x 11mo (worsening) Seen last in 11/2012 with similar symptoms Pt states that OTC do not help. Reports using Symbicort and Albuterol as rxd Pt requesting something be called into pharmacy for him.                                                         Allergies  Allergen Reactions  . Guaifenesin Er Other (See Comments)    dizzy                                                          Medication Current List       This list is accurate as of: 04/21/13  2:27 PM.  Always use your most recent med list.               albuterol 108 (90 BASE) MCG/ACT inhaler  Commonly known as:  PROVENTIL HFA;VENTOLIN HFA  Inhale 2 puffs into the lungs every 6 (six) hours as needed.     alendronate 70 MG tablet  Commonly known as:  FOSAMAX  Take 1 tablet (70 mg total) by mouth every 7 (seven) days. Take with a full glass of water on an empty stomach.     aspirin 81 MG tablet  Take 81 mg by mouth daily.     budesonide-formoterol 160-4.5 MCG/ACT inhaler  Commonly known as:  SYMBICORT  Inhale 2 puffs into the lungs 2 (two) times daily.     calcium-vitamin D 500-200 MG-UNIT per tablet  Commonly known as:  OSCAL WITH D  Take 1 tablet by mouth 2 (two) times daily.     clonazePAM 2 MG tablet  Commonly known as:  KLONOPIN  Take 1 mg by mouth at bedtime as needed.     doxazosin 4 MG tablet  Commonly known as:  CARDURA  Take 4 mg by mouth at bedtime.     ergocalciferol 50000 UNITS capsule  Commonly known as:  VITAMIN D2  Take 50,000 Units by mouth once a week.     FLUTTER Devi  Blow through 4 times, 3-4 times daily to clear your chest     Garlic 100 MG Tabs  Take 1 tablet by mouth daily.     hydrochlorothiazide 25 MG tablet  Commonly known as:  HYDRODIURIL  Take 25 mg by mouth daily.     losartan 100 MG tablet  Commonly known as:  COZAAR  Take 100 mg by mouth daily.     multivitamin capsule   Take 1 capsule by mouth daily.     omeprazole 20 MG capsule  Commonly known as:  PRILOSEC  Take 20 mg by mouth daily.     Red Yeast Rice 600 MG Caps  Take by mouth.       [facility], [place]  Dr Maple Hudson please advise of any recs for the patient. Thanks.  Pt requests detailed message left on machine.

## 2013-04-21 NOTE — Telephone Encounter (Signed)
LM detailed on machine- Augmentin 875 #20 1 BID called into [facility] [place]

## 2013-04-21 NOTE — Telephone Encounter (Signed)
Per CY-Augmentin 875 mg #20 take 1 po BID no refills.  

## 2013-06-20 ENCOUNTER — Other Ambulatory Visit: Payer: Medicare Other

## 2013-06-20 ENCOUNTER — Ambulatory Visit (INDEPENDENT_AMBULATORY_CARE_PROVIDER_SITE_OTHER): Payer: Medicare Other | Admitting: Internal Medicine

## 2013-06-20 ENCOUNTER — Encounter: Payer: Self-pay | Admitting: Internal Medicine

## 2013-06-20 VITALS — BP 146/60 | HR 71 | Ht 68.75 in | Wt 192.0 lb

## 2013-06-20 DIAGNOSIS — J479 Bronchiectasis, uncomplicated: Secondary | ICD-10-CM

## 2013-06-20 DIAGNOSIS — J42 Unspecified chronic bronchitis: Secondary | ICD-10-CM

## 2013-06-20 DIAGNOSIS — J449 Chronic obstructive pulmonary disease, unspecified: Secondary | ICD-10-CM

## 2013-06-20 NOTE — Patient Instructions (Signed)
Order- lab- a1AT assay for level and phenotype       Dx chronic bronchitis, bronchiectasis  Order Cystic Fibrosis mutation assay    # [phone]

## 2013-06-20 NOTE — Progress Notes (Signed)
04/09/11-77 year old male former smoker followed for asthma/COPD, insomnia, complicated by HBP, chronic gastritis, GERD Last here 04/11/2010 Has had flu vaccine He reports increased sinus drainage in April. He saw an ENT doctor in [place] at [facility] ([facility]) who did a CT. That problem has been doing better. He has persistent sense of chest congestion especially in the right lung. Productive cough affects his sleep. Coughing yellow or sometimes darker sputum. He denies reflux, fever, swollen glands or blood. Weight is down 7 or 8 pounds. Poor sense of smell has decreased his appetite. [facility] has also treated his bronchitis/COPD with prednisone and antibiotic without help. He has taken prednisone tapers twice and antibiotics twice. None of this has made a big difference in his cough.  05/18/11- 76 year old male former smoker followed for asthma/COPD, insomnia, complicated by HBP, chronic gastritis, GERD At last visit he needed a nebulizer treatment and Depo-Medrol, and we had recommended saline nasal lavage. These did well. He says now that he is "better than I was, just comes and goes now". Sputum had grown a pneumococcus strongly resistant to penicillin and cephalosporins. We had sent Levaquin x10 days. -he just finished this morning. Walking on a treadmill helps him loosen the sputum. He has had pneumonia vaccine in 1984 and 1994. He says "Mucinex tears my lungs up". CBC 04/09/2011-WBC 8500 Chest x-ray: 04/09/2011-COPD without acute process.  08/18/11- 77 year old male former smoker followed for asthma/COPD, insomnia, complicated by HBP, chronic gastritis, GERD [name] overstimulated and made him jittery. He is trying to take one half pill daily because it did reduce his mucus production. Cough with white sputum. Reduced dose did not help his nervousness. Some tachycardia palpitation. Weight down 7 or 8 pounds. Blames [name] for increased reflux. Exercising on treadmill 20 minutes daily. Gets  shifting pains around anterior chest up into the shoulders, not clearly related to activity or exertion.  11/17/11- 77 year old male former smoker followed for asthma/COPD, insomnia, complicated by HBP, chronic gastritis, GERD Sob,wheezing upon activity and weather.  He quit [name] because it was making him depressed. Otherwise says he is "pretty good". Chest pain and tightness have improved. Sinus drainage stopped with over-the-counter antihistamines. He is walking 2 miles each morning and 1 mild each evening. Taking Benadryl at bedtime. CAT COPD score 29. CXR 04/13/11- Reviewed: IMPRESSION:  Chronic obstructive pulmonary disease without acute superimposed  process.  Original Report Authenticated By: Vivia Ewing, M.D.   03/21/12- 77 year old male former smoker followed for chronic bronchitis/COPD, insomnia, complicated by HBP, chronic gastritis, GERD Productive cough-yellow to green in color; wheezing, SOB, congestion-chest only(feels like Right side mainly) COPD assessment test (CABG) score 35/40 Sensitive to temperature and weather changes triggering cough. Sputum is chronic, usually yellow green with no blood. He did not tolerate [name]  06/20/12- 77 year old male former smoker followed for chronic bronchitis/COPD, insomnia, complicated by HBP, chronic gastritis, GERD FOLLOWS FOR: breathing is doing fine. denies chest pain, chest tightness, increased SOB. Feels "fine today". Antibiotic cleared infection. On prednisone taper for cervical arthritis. CXR 03/25/12-  IMPRESSION:  1. Bibasilar opacities are new from previous exam and may  represent atelectasis or infiltrate.  Original Report Authenticated By: Angelita Ingles, M.D.   12/19/12- 77 year old male former smoker followed for chronic bronchitis/COPD, insomnia, complicated by HBP, chronic gastritis, GERD FOLLOWS FOR: heat and humidity making patient more SOB and full of congestion; does better in cooler temps Steroid  injection for back pain did not help. Cough remains  very productive especially when lying down. Chronic  insomnia better with clonazepam.  06/20/13- 77 year old male former smoker followed for chronic bronchitis/COPD, insomnia, complicated by HBP, chronic gastritis, GERD Follows for: 6 month rov.  States his breathing problems "come and go".  Pt denies any congestion, SOB at this time.  He continues daily acid blocker with history of remote reflux but no recent significant problem recognized. Occasional sweats. Productive cough everyday, worse lying down at night. Sputum is usually white or yellow with no blood and no fever or chest pain. CXR 12/30/12 Findings: Trachea is midline. Heart size stable. Scarring mild  volume loss are seen in both lung bases. Biapical pleural  thickening. Lungs are otherwise clear. No pleural fluid.  Degenerative changes are seen in the spine.  IMPRESSION:  No acute findings.  Original Report Authenticated By: Leanna Battles, M.D.   ROS-See HPI Constitutional:   No-   weight loss, night sweats, fevers, chills, fatigue, lassitude. HEENT:   No-  headaches, difficulty swallowing, tooth/dental problems, sore throat,       No-  sneezing, itching, ear ache,     no current- nasal congestion, post nasal drip,  CV:  + chest pain, orthopnea, PND, swelling in lower extremities, anasarca, izziness, palpitations Resp: No- acute shortness of breath with exertion or at rest.              +Persistent  productive cough,  No non-productive cough,  No- coughing up of blood.              +   change in color of mucus.  No- wheezing.   Skin: No-   rash or lesions. GI: + heartburn, indigestion,  No-abdominal pain, nausea, vomiting,  GU:  MS:  No-   joint pain or swelling.  No- decreased range of motion.  No- back pain. Neuro-     nothing unusual Psych:  No- change in mood or affect. No depression or anxiety.  No memory loss.  OBJ General- Alert, Oriented, Affect-appropriate,  Distress- none acute; overweight Skin- rash-none, lesions- none, excoriation- none Lymphadenopathy- none Head- atraumatic            Eyes- Gross vision intact, PERRLA, conjunctivae clear secretions            Ears- Hearing, canals-normal            Nose- Clear, no-Septal dev, mucus, polyps, erosion, perforation             Throat- Mallampati III , mucosa cobblestoned , drainage- none, tonsils- atrophic Neck- flexible , trachea midline, no stridor , thyroid nl, carotid no bruit Chest - symmetrical excursion , unlabored           Heart/CV- RRR , no murmur , no gallop  , no rub, nl s1 s2                           - JVD- none , edema- none, stasis changes- none, varices- none           Lung-+ bibasilar rhonchi, Unlabored , dullness-none, rub- none           Chest wall-  Abd-  Br/ Gen/ Rectal- Not done, not indicated Extrem- cyanosis- none, clubbing, none, atrophy- none, strength- nl Neuro- grossly intact to observation

## 2013-06-23 LAB — CYSTIC FIBROSIS DIAGNOSTIC STUDY

## 2013-06-27 LAB — ALPHA-1 ANTITRYPSIN PHENOTYPE: A-1 Antitrypsin: 132 mg/dL (ref 83–199)

## 2013-06-29 ENCOUNTER — Telehealth: Payer: Self-pay | Admitting: Internal Medicine

## 2013-06-29 NOTE — Telephone Encounter (Signed)
Notes Recorded by Deneise Lever, MD on 06/28/2013 at 8:50 AM Tests for genetic risks for lung disease, including alpha 1 antitrypsin and cystic fibrosis, are normal. ---  I spoke with patient about results and he verbalized understanding and had no questions

## 2013-06-30 NOTE — Progress Notes (Signed)
Quick Note:  LMTCB ______ 

## 2013-07-03 ENCOUNTER — Telehealth: Payer: Self-pay | Admitting: Internal Medicine

## 2013-07-03 NOTE — Telephone Encounter (Signed)
Pt aware of results per CY.  Nothing further is needed

## 2013-07-06 ENCOUNTER — Telehealth: Payer: Self-pay | Admitting: Internal Medicine

## 2013-07-06 NOTE — Telephone Encounter (Signed)
Looks like we already gave him results- see last PN  LMTCB

## 2013-07-07 NOTE — Telephone Encounter (Signed)
Pt returned call.  Pt confirmed he has received results & nothing further is needed at this time.  Satira Anis

## 2013-07-12 NOTE — Assessment & Plan Note (Signed)
No definite trend. We discussed avoidance of virus respiratory infections during the winter. Plan-check for cystic fibrosis mutation and for a1AT, as possible contributors to his chronic bronchitis pattern

## 2013-09-20 ENCOUNTER — Other Ambulatory Visit (HOSPITAL_COMMUNITY): Payer: Self-pay | Admitting: Urology

## 2013-09-20 DIAGNOSIS — C61 Malignant neoplasm of prostate: Secondary | ICD-10-CM

## 2013-10-06 ENCOUNTER — Encounter (HOSPITAL_COMMUNITY)
Admission: RE | Admit: 2013-10-06 | Discharge: 2013-10-06 | Disposition: A | Discharge status: Home / Self Care | Payer: Medicare HMO | Source: Ambulatory Visit | Attending: Urology | Admitting: Urology

## 2013-10-06 ENCOUNTER — Encounter (HOSPITAL_COMMUNITY): Payer: Medicare HMO

## 2013-10-06 DIAGNOSIS — C61 Malignant neoplasm of prostate: Secondary | ICD-10-CM

## 2013-10-06 MED ORDER — TECHNETIUM TC 99M MEDRONATE IV KIT
26.9000 | PACK | Freq: Once | INTRAVENOUS | Status: AC | PRN
  Administered 2013-10-06: 26.9 via INTRAVENOUS

## 2013-10-16 ENCOUNTER — Other Ambulatory Visit (HOSPITAL_COMMUNITY): Payer: Self-pay | Admitting: Urology

## 2013-10-16 DIAGNOSIS — C61 Malignant neoplasm of prostate: Secondary | ICD-10-CM

## 2013-10-30 ENCOUNTER — Other Ambulatory Visit (HOSPITAL_COMMUNITY): Payer: Self-pay | Admitting: Urology

## 2013-10-30 ENCOUNTER — Ambulatory Visit (HOSPITAL_COMMUNITY)
Admission: RE | Admit: 2013-10-30 | Discharge: 2013-10-30 | Disposition: A | Discharge status: Home / Self Care | Payer: Medicare HMO | Source: Ambulatory Visit | Attending: Urology | Admitting: Urology

## 2013-10-30 ENCOUNTER — Other Ambulatory Visit (HOSPITAL_COMMUNITY): Payer: Medicare HMO

## 2013-10-30 DIAGNOSIS — Y842 Radiological procedure and radiotherapy as the cause of abnormal reaction of the patient, or of later complication, without mention of misadventure at the time of the procedure: Secondary | ICD-10-CM | POA: Insufficient documentation

## 2013-10-30 DIAGNOSIS — T1590XA Foreign body on external eye, part unspecified, unspecified eye, initial encounter: Secondary | ICD-10-CM

## 2013-10-30 DIAGNOSIS — Z77018 Contact with and (suspected) exposure to other hazardous metals: Secondary | ICD-10-CM | POA: Insufficient documentation

## 2013-10-30 DIAGNOSIS — C61 Malignant neoplasm of prostate: Secondary | ICD-10-CM | POA: Insufficient documentation

## 2013-10-30 DIAGNOSIS — Z1389 Encounter for screening for other disorder: Secondary | ICD-10-CM | POA: Insufficient documentation

## 2013-10-30 DIAGNOSIS — T66XXXA Radiation sickness, unspecified, initial encounter: Secondary | ICD-10-CM | POA: Insufficient documentation

## 2013-10-30 DIAGNOSIS — Z923 Personal history of irradiation: Secondary | ICD-10-CM | POA: Insufficient documentation

## 2013-10-30 LAB — POCT I-STAT CREATININE: Creatinine, Ser: 1 mg/dL (ref 0.50–1.35)

## 2013-10-30 MED ORDER — GADOBENATE DIMEGLUMINE 529 MG/ML IV SOLN
18.0000 mL | Freq: Once | INTRAVENOUS | Status: AC | PRN
  Administered 2013-10-30: 18 mL via INTRAVENOUS

## 2013-11-20 ENCOUNTER — Telehealth: Payer: Self-pay | Admitting: Internal Medicine

## 2013-11-20 NOTE — Telephone Encounter (Signed)
LMOM x 1 at [phone] ext [phone] for Selita to return call

## 2013-11-21 ENCOUNTER — Encounter: Payer: Self-pay | Admitting: Internal Medicine

## 2013-11-21 NOTE — Telephone Encounter (Signed)
Forms placed on CY's cart with message.

## 2013-11-21 NOTE — Telephone Encounter (Signed)
I spoke with [facility] and she states the pt is going to have a cryo-ablation of his prostate under general anesthesia and they are wanting clearance for this. She states she faxed over paperwork regarding this yesterday. Please advise. Lenoir Bing, CMA

## 2013-11-21 NOTE — Telephone Encounter (Signed)
Per CDY- he had the form but now can not locate this  I called Dr Sammie Bench office and spoke with Solmon Ice and asked her to re fax this to the up front fax  Will forward to KW to watch out for this

## 2013-11-22 ENCOUNTER — Encounter: Payer: Self-pay | Admitting: Internal Medicine

## 2013-11-22 NOTE — Telephone Encounter (Signed)
Done

## 2013-11-23 NOTE — Telephone Encounter (Signed)
I have faxed letter to Dr Sammie Bench office and mailed a copy to the patient as well.

## 2013-11-23 NOTE — Telephone Encounter (Signed)
Taylor [place] do you have this or has this been faxed back? thanks

## 2013-12-01 ENCOUNTER — Telehealth: Payer: Self-pay | Admitting: Internal Medicine

## 2013-12-01 NOTE — Telephone Encounter (Signed)
I have refaxed this to them at the fax # provided. [facility] and was omn hold x 10 min. Will sign off message

## 2013-12-04 ENCOUNTER — Other Ambulatory Visit: Payer: Self-pay | Admitting: Urology

## 2013-12-19 ENCOUNTER — Ambulatory Visit (INDEPENDENT_AMBULATORY_CARE_PROVIDER_SITE_OTHER)
Admission: RE | Admit: 2013-12-19 | Discharge: 2013-12-19 | Disposition: A | Discharge status: Home / Self Care | Payer: Medicare HMO | Source: Ambulatory Visit | Attending: Internal Medicine | Admitting: Internal Medicine

## 2013-12-19 ENCOUNTER — Ambulatory Visit (INDEPENDENT_AMBULATORY_CARE_PROVIDER_SITE_OTHER): Payer: Medicare HMO | Admitting: Internal Medicine

## 2013-12-19 ENCOUNTER — Encounter: Payer: Self-pay | Admitting: Internal Medicine

## 2013-12-19 VITALS — BP 112/80 | HR 65 | Ht 68.75 in | Wt 187.6 lb

## 2013-12-19 DIAGNOSIS — J479 Bronchiectasis, uncomplicated: Secondary | ICD-10-CM

## 2013-12-19 DIAGNOSIS — J449 Chronic obstructive pulmonary disease, unspecified: Secondary | ICD-10-CM

## 2013-12-19 NOTE — Patient Instructions (Signed)
Order- CXR   Dx bronchiectasis without exacerbation  Please call as needed

## 2013-12-19 NOTE — Progress Notes (Signed)
04/09/11-78 year old male former smoker followed for asthma/COPD, insomnia, complicated by HBP, chronic gastritis, GERD Last here 04/11/2010 Has had flu vaccine He reports increased sinus drainage in April. He saw an ENT doctor in [place] at [facility] ([facility]) who did a CT. That problem has been doing better. He has persistent sense of chest congestion especially in the right lung. Productive cough affects his sleep. Coughing yellow or sometimes darker sputum. He denies reflux, fever, swollen glands or blood. Weight is down 7 or 8 pounds. Poor sense of smell has decreased his appetite. [facility] has also treated his bronchitis/COPD with prednisone and antibiotic without help. He has taken prednisone tapers twice and antibiotics twice. None of this has made a big difference in his cough.  05/18/11- 78 year old male former smoker followed for asthma/COPD, insomnia, complicated by HBP, chronic gastritis, GERD At last visit he needed a nebulizer treatment and Depo-Medrol, and we had recommended saline nasal lavage. These did well. He says now that he is "better than I was, just comes and goes now". Sputum had grown a pneumococcus strongly resistant to penicillin and cephalosporins. We had sent Levaquin x10 days. -he just finished this morning. Walking on a treadmill helps him loosen the sputum. He has had pneumonia vaccine in 1984 and 1994. He says "Mucinex tears my lungs up". CBC 04/09/2011-WBC 8500 Chest x-ray: 04/09/2011-COPD without acute process.  08/18/11- 79 year old male former smoker followed for asthma/COPD, insomnia, complicated by HBP, chronic gastritis, GERD [name] overstimulated and made him jittery. He is trying to take one half pill daily because it did reduce his mucus production. Cough with white sputum. Reduced dose did not help his nervousness. Some tachycardia palpitation. Weight down 7 or 8 pounds. Blames [name] for increased reflux. Exercising on treadmill 20 minutes daily. Gets  shifting pains around anterior chest up into the shoulders, not clearly related to activity or exertion.  11/17/11- 78 year old male former smoker followed for asthma/COPD, insomnia, complicated by HBP, chronic gastritis, GERD Sob,wheezing upon activity and weather.  He quit [name] because it was making him depressed. Otherwise says he is "pretty good". Chest pain and tightness have improved. Sinus drainage stopped with over-the-counter antihistamines. He is walking 2 miles each morning and 1 mild each evening. Taking Benadryl at bedtime. CAT COPD score 29. CXR 04/13/11- Reviewed: IMPRESSION:  Chronic obstructive pulmonary disease without acute superimposed  process.  Original Report Authenticated By: Vivia Ewing, M.D.   03/21/12- 78 year old male former smoker followed for chronic bronchitis/COPD, insomnia, complicated by HBP, chronic gastritis, GERD Productive cough-yellow to green in color; wheezing, SOB, congestion-chest only(feels like Right side mainly) COPD assessment test (CABG) score 35/40 Sensitive to temperature and weather changes triggering cough. Sputum is chronic, usually yellow green with no blood. He did not tolerate [name]  06/20/12- 78 year old male former smoker followed for chronic bronchitis/COPD, insomnia, complicated by HBP, chronic gastritis, GERD FOLLOWS FOR: breathing is doing fine. denies chest pain, chest tightness, increased SOB. Feels "fine today". Antibiotic cleared infection. On prednisone taper for cervical arthritis. CXR 03/25/12-  IMPRESSION:  1. Bibasilar opacities are new from previous exam and may  represent atelectasis or infiltrate.  Original Report Authenticated By: Angelita Ingles, M.D.   12/19/12- 78 year old male former smoker followed for chronic bronchitis/COPD, insomnia, complicated by HBP, chronic gastritis, GERD FOLLOWS FOR: heat and humidity making patient more SOB and full of congestion; does better in cooler temps Steroid  injection for back pain did not help. Cough remains  very productive especially when lying down. Chronic  insomnia better with clonazepam.  06/20/13- 78 year old male former smoker followed for chronic bronchitis/COPD, insomnia, complicated by HBP, chronic gastritis, GERD Follows for: 6 month rov.  States his breathing problems "come and go".  Pt denies any congestion, SOB at this time.  He continues daily acid blocker with history of remote reflux but no recent significant problem recognized. Occasional sweats. Productive cough everyday, worse lying down at night. Sputum is usually white or yellow with no blood and no fever or chest pain. CXR 12/30/12 Findings: Trachea is midline. Heart size stable. Scarring mild  volume loss are seen in both lung bases. Biapical pleural  thickening. Lungs are otherwise clear. No pleural fluid.  Degenerative changes are seen in the spine.  IMPRESSION:  No acute findings.  Original Report Authenticated By: Lorin Picket, M.D.   12/19/13-78 year old male former smoker followed for chronic bronchitis/COPD, insomnia, complicated by HBP, chronic gastritis, GERD FOLLOWS FOR: Congestion, hard time breathing with heat and humidity. Says no real change in chronic cough,, productive white, non-bloody phlegm. Hot, humid weather just makes it more uncomfortable. No chest pain or palpitation. Says nebs and Flutter device not helpful. "Best thing is a shot of white liquor"- relaxes airways so he can cough productively. Has tried mucinex. Labs for a1AT def and Cystic Fibrosis genes were normal/ neg 06/20/13.  ROS-See HPI Constitutional:   No-   weight loss, night sweats, fevers, chills, fatigue, lassitude. HEENT:   No-  headaches, difficulty swallowing, tooth/dental problems, sore throat,       No-  sneezing, itching, ear ache,     no current- nasal congestion, post nasal drip,  CV:  No-chest pain, orthopnea, PND, swelling in lower extremities, anasarca, dizziness,  palpitations Resp: No- acute shortness of breath with exertion or at rest.              +Persistent  productive cough,  No non-productive cough,  No- coughing up of blood.              No-change in color of mucus.  No- wheezing.   Skin: No-   rash or lesions. GI: + heartburn, indigestion,  No-abdominal pain, nausea, vomiting,  GU:  MS:  No-   joint pain or swelling.  No- decreased range of motion.  + back pain. Neuro-     nothing unusual Psych:  No- change in mood or affect. No depression or anxiety.  No memory loss.  OBJ General- Alert, Oriented, Affect-appropriate, Distress- none acute; overweight Skin- rash-none, lesions- none, excoriation- none Lymphadenopathy- none Head- atraumatic            Eyes- Gross vision intact, PERRLA, conjunctivae clear secretions            Ears- Hearing, canals-normal            Nose- Clear, no-Septal dev, mucus, polyps, erosion, perforation             Throat- Mallampati III , mucosa cobblestoned , drainage- none, tonsils- atrophic Neck- flexible , trachea midline, no stridor , thyroid nl, carotid no bruit Chest - symmetrical excursion , unlabored           Heart/CV- RRR , no murmur , no gallop  , no rub, nl s1 s2                           - JVD- none , edema- none, stasis changes- none, varices- none  Lung-+ bibasilar rhonchi, Unlabored , dullness-none, rub- none           Chest wall-  Abd-  Br/ Gen/ Rectal- Not done, not indicated Extrem- cyanosis- none, clubbing, none, atrophy- none, strength- nl Neuro- grossly intact to observation

## 2013-12-20 NOTE — Assessment & Plan Note (Signed)
Less comfortable in hot weather, but I think his lung disease is stable. Baseline w/o acute exacerbation.  From a pulmonary standpoint he is as good as he will get for necessary prostate cryosurgery. Plan CXR

## 2013-12-25 ENCOUNTER — Encounter (HOSPITAL_BASED_OUTPATIENT_CLINIC_OR_DEPARTMENT_OTHER): Payer: Self-pay | Admitting: *Deleted

## 2013-12-26 ENCOUNTER — Encounter (HOSPITAL_BASED_OUTPATIENT_CLINIC_OR_DEPARTMENT_OTHER): Payer: Self-pay | Admitting: *Deleted

## 2013-12-26 NOTE — Progress Notes (Signed)
12/26/13 1200  OBSTRUCTIVE SLEEP APNEA  Have you ever been diagnosed with sleep apnea through a sleep study? No  Do you snore loudly (loud enough to be heard through closed doors)?  0  Do you often feel tired, fatigued, or sleepy during the daytime? 0  Do you have, or are you being treated for high blood pressure? 1  BMI more than 35 kg/m2? 0  Age over 76 years old? 1  Neck circumference greater than 40 cm/16 inches? 1  Gender: 1  Obstructive Sleep Apnea Score 4  Score 4 or greater  Results sent to PCP

## 2013-12-26 NOTE — Progress Notes (Addendum)
NPO AFTER MN. ARRIVE AT 0715. NEEDS ISTAT AND EKG. CURRENT CXR IN CHART AND EPIC. WILL TAKE PRILOSEC , LOSARTAN, AND DO SYMBICORT INHALER AM DOS W/ SIPS OF WATER .  WILL BRING RESCUE INHALER.  REVIEWED W/ DR FORTUNE MDA, OK TO PROCEED, TO FURTHER ORDERS.

## 2013-12-29 ENCOUNTER — Encounter (HOSPITAL_BASED_OUTPATIENT_CLINIC_OR_DEPARTMENT_OTHER): Payer: Medicare HMO | Admitting: Anesthesiology

## 2013-12-29 ENCOUNTER — Ambulatory Visit (HOSPITAL_BASED_OUTPATIENT_CLINIC_OR_DEPARTMENT_OTHER): Payer: Medicare HMO | Admitting: Anesthesiology

## 2013-12-29 ENCOUNTER — Other Ambulatory Visit: Payer: Self-pay

## 2013-12-29 ENCOUNTER — Encounter (HOSPITAL_BASED_OUTPATIENT_CLINIC_OR_DEPARTMENT_OTHER): Payer: Self-pay

## 2013-12-29 ENCOUNTER — Ambulatory Visit (HOSPITAL_BASED_OUTPATIENT_CLINIC_OR_DEPARTMENT_OTHER)
Admission: RE | Admit: 2013-12-29 | Discharge: 2013-12-29 | Disposition: A | Discharge status: Home / Self Care | Payer: Medicare HMO | Source: Ambulatory Visit | Attending: Urology | Admitting: Urology

## 2013-12-29 ENCOUNTER — Encounter (HOSPITAL_BASED_OUTPATIENT_CLINIC_OR_DEPARTMENT_OTHER)
Admission: RE | Disposition: A | Discharge status: Home / Self Care | Payer: Self-pay | Source: Ambulatory Visit | Attending: Urology

## 2013-12-29 DIAGNOSIS — Z7982 Long term (current) use of aspirin: Secondary | ICD-10-CM | POA: Insufficient documentation

## 2013-12-29 DIAGNOSIS — I10 Essential (primary) hypertension: Secondary | ICD-10-CM | POA: Insufficient documentation

## 2013-12-29 DIAGNOSIS — Z87891 Personal history of nicotine dependence: Secondary | ICD-10-CM | POA: Insufficient documentation

## 2013-12-29 DIAGNOSIS — Z79899 Other long term (current) drug therapy: Secondary | ICD-10-CM | POA: Insufficient documentation

## 2013-12-29 DIAGNOSIS — J4489 Other specified chronic obstructive pulmonary disease: Secondary | ICD-10-CM | POA: Insufficient documentation

## 2013-12-29 DIAGNOSIS — J449 Chronic obstructive pulmonary disease, unspecified: Secondary | ICD-10-CM | POA: Insufficient documentation

## 2013-12-29 DIAGNOSIS — C61 Malignant neoplasm of prostate: Secondary | ICD-10-CM | POA: Insufficient documentation

## 2013-12-29 DIAGNOSIS — K219 Gastro-esophageal reflux disease without esophagitis: Secondary | ICD-10-CM | POA: Insufficient documentation

## 2013-12-29 HISTORY — DX: Malignant neoplasm of prostate: C61

## 2013-12-29 HISTORY — DX: Other specified chronic obstructive pulmonary disease: J44.89

## 2013-12-29 HISTORY — DX: Bronchiectasis, uncomplicated: J47.9

## 2013-12-29 HISTORY — DX: Personal history of other malignant neoplasm of skin: Z85.828

## 2013-12-29 HISTORY — DX: Personal history of other diseases of the digestive system: Z87.19

## 2013-12-29 HISTORY — DX: Essential (primary) hypertension: I10

## 2013-12-29 HISTORY — DX: Nocturia: R35.1

## 2013-12-29 HISTORY — DX: Personal history of colon polyps, unspecified: Z86.0100

## 2013-12-29 HISTORY — DX: Gastro-esophageal reflux disease without esophagitis: K21.9

## 2013-12-29 HISTORY — DX: Personal history of colonic polyps: Z86.010

## 2013-12-29 HISTORY — PX: CRYOABLATION: SHX1415

## 2013-12-29 HISTORY — DX: Chronic obstructive pulmonary disease, unspecified: J44.9

## 2013-12-29 LAB — POCT I-STAT 4, (NA,K, GLUC, HGB,HCT)
Glucose, Bld: 97 mg/dL (ref 70–99)
HCT: 43 % (ref 39.0–52.0)
Hemoglobin: 14.6 g/dL (ref 13.0–17.0)
Potassium: 3.8 mEq/L (ref 3.7–5.3)
SODIUM: 140 meq/L (ref 137–147)

## 2013-12-29 SURGERY — CRYOABLATION, PROSTATE
Anesthesia: General | Site: Prostate

## 2013-12-29 MED ORDER — CIPROFLOXACIN IN D5W 400 MG/200ML IV SOLN
400.0000 mg | INTRAVENOUS | Status: AC
  Administered 2013-12-29: 400 mg via INTRAVENOUS
  Filled 2013-12-29: qty 200

## 2013-12-29 MED ORDER — LACTATED RINGERS IV SOLN
INTRAVENOUS | Status: DC
  Administered 2013-12-29: 08:00:00 via INTRAVENOUS
  Filled 2013-12-29: qty 1000

## 2013-12-29 MED ORDER — OXYCODONE HCL 5 MG PO TABS
5.0000 mg | ORAL_TABLET | Freq: Once | ORAL | Status: DC | PRN
  Filled 2013-12-29: qty 1

## 2013-12-29 MED ORDER — HYDROCODONE-ACETAMINOPHEN 5-325 MG PO TABS: 1.0000 | ORAL_TABLET | Freq: Four times a day (QID) | ORAL | Status: DC | PRN

## 2013-12-29 MED ORDER — FENTANYL CITRATE 0.05 MG/ML IJ SOLN
INTRAMUSCULAR | Status: AC
  Filled 2013-12-29: qty 4

## 2013-12-29 MED ORDER — STERILE WATER FOR IRRIGATION IR SOLN
Status: DC | PRN
  Administered 2013-12-29: 6500 mL

## 2013-12-29 MED ORDER — PROMETHAZINE HCL 25 MG/ML IJ SOLN
6.2500 mg | INTRAMUSCULAR | Status: DC | PRN
  Filled 2013-12-29: qty 1

## 2013-12-29 MED ORDER — MEPERIDINE HCL 25 MG/ML IJ SOLN
6.2500 mg | INTRAMUSCULAR | Status: DC | PRN
  Filled 2013-12-29: qty 1

## 2013-12-29 MED ORDER — SUCCINYLCHOLINE CHLORIDE 20 MG/ML IJ SOLN
INTRAMUSCULAR | Status: DC | PRN
  Administered 2013-12-29: 100 mg via INTRAVENOUS

## 2013-12-29 MED ORDER — CIPROFLOXACIN IN D5W 400 MG/200ML IV SOLN
INTRAVENOUS | Status: AC
  Filled 2013-12-29: qty 200

## 2013-12-29 MED ORDER — CIPROFLOXACIN HCL 500 MG PO TABS: 500.0000 mg | ORAL_TABLET | Freq: Two times a day (BID) | ORAL | Status: DC

## 2013-12-29 MED ORDER — ONDANSETRON HCL 4 MG/2ML IJ SOLN
INTRAMUSCULAR | Status: DC | PRN
  Administered 2013-12-29: 4 mg via INTRAVENOUS

## 2013-12-29 MED ORDER — DEXAMETHASONE SODIUM PHOSPHATE 4 MG/ML IJ SOLN
INTRAMUSCULAR | Status: DC | PRN
  Administered 2013-12-29: 8 mg via INTRAVENOUS

## 2013-12-29 MED ORDER — OXYCODONE HCL 5 MG/5ML PO SOLN
5.0000 mg | Freq: Once | ORAL | Status: DC | PRN
  Filled 2013-12-29: qty 5

## 2013-12-29 MED ORDER — PROPOFOL 10 MG/ML IV BOLUS
INTRAVENOUS | Status: DC | PRN
  Administered 2013-12-29: 30 mg via INTRAVENOUS
  Administered 2013-12-29: 120 mg via INTRAVENOUS

## 2013-12-29 MED ORDER — EPHEDRINE SULFATE 50 MG/ML IJ SOLN
INTRAMUSCULAR | Status: DC | PRN
  Administered 2013-12-29 (×4): 10 mg via INTRAVENOUS

## 2013-12-29 MED ORDER — HYDROMORPHONE HCL PF 1 MG/ML IJ SOLN
0.2500 mg | INTRAMUSCULAR | Status: DC | PRN
  Filled 2013-12-29: qty 1

## 2013-12-29 MED ORDER — LIDOCAINE HCL (CARDIAC) 20 MG/ML IV SOLN
INTRAVENOUS | Status: DC | PRN
  Administered 2013-12-29: 50 mg via INTRAVENOUS

## 2013-12-29 MED ORDER — FENTANYL CITRATE 0.05 MG/ML IJ SOLN
INTRAMUSCULAR | Status: DC | PRN
  Administered 2013-12-29 (×4): 25 ug via INTRAVENOUS

## 2013-12-29 SURGICAL SUPPLY — 35 items
BAG URINE DRAINAGE (UROLOGICAL SUPPLIES) ×3 IMPLANT
BAG URINE LEG 19OZ MD ST LTX (BAG) IMPLANT
BNDG CONFORM 3 STRL LF (GAUZE/BANDAGES/DRESSINGS) IMPLANT
CANISTER SUCTION 2500CC (MISCELLANEOUS) IMPLANT
CATH FOLEY 2WAY SLVR  5CC 18FR (CATHETERS) ×4
CATH FOLEY 2WAY SLVR 5CC 18FR (CATHETERS) ×2 IMPLANT
CHARGE TECH PROCEDURE ONCURA (LABOR (TRAVEL & OVERTIME)) ×3 IMPLANT
CLOTH BEACON ORANGE TIMEOUT ST (SAFETY) ×3 IMPLANT
COVER MAYO STAND STRL (DRAPES) ×3 IMPLANT
DRAPE CAMERA CLOSED 9X96 (DRAPES) ×3 IMPLANT
DRAPE INCISE IOBAN 66X45 STRL (DRAPES) ×6 IMPLANT
DRSG TEGADERM 4X4.75 (GAUZE/BANDAGES/DRESSINGS) ×3 IMPLANT
DRSG TEGADERM 8X12 (GAUZE/BANDAGES/DRESSINGS) ×9 IMPLANT
GAS ARGON HIGH PRESSURE (MEDICAL GASES) ×3 IMPLANT
GAS HELIUM HIGH PRESSURE (MEDICAL GASES) ×3 IMPLANT
GLOVE BIO SURGEON STRL SZ 6.5 (GLOVE) ×2 IMPLANT
GLOVE BIO SURGEON STRL SZ7 (GLOVE) ×3 IMPLANT
GLOVE BIO SURGEONS STRL SZ 6.5 (GLOVE) ×1
GLOVE BIOGEL PI IND STRL 6.5 (GLOVE) ×1 IMPLANT
GLOVE BIOGEL PI INDICATOR 6.5 (GLOVE) ×2
GOWN STRL REUS W/ TWL XL LVL3 (GOWN DISPOSABLE) ×2 IMPLANT
GOWN STRL REUS W/TWL XL LVL3 (GOWN DISPOSABLE) ×4
GUIDEWIRE SUPER STIFF (WIRE) ×3 IMPLANT
HOLDER FOLEY CATH W/STRAP (MISCELLANEOUS) ×3 IMPLANT
KIT ICE ROD PROCEDURE PRECISE (DISPOSABLE) ×3 IMPLANT
KIT PROSTATE PRESICE I (KITS) IMPLANT
NEEDLE SPNL 18GX3.5 QUINCKE PK (NEEDLE) IMPLANT
PACK CYSTOSCOPY (CUSTOM PROCEDURE TRAY) ×3 IMPLANT
PLUG CATH AND CAP STER (CATHETERS) ×3 IMPLANT
SPONGE GAUZE 4X4 12PLY (GAUZE/BANDAGES/DRESSINGS) ×3 IMPLANT
SPONGE GAUZE 4X4 12PLY STER LF (GAUZE/BANDAGES/DRESSINGS) ×3 IMPLANT
SYRINGE IRR TOOMEY STRL 70CC (SYRINGE) IMPLANT
TRAY DSU PREP LF (CUSTOM PROCEDURE TRAY) ×3 IMPLANT
UNDERPAD 30X30 INCONTINENT (UNDERPADS AND DIAPERS) ×3 IMPLANT
WATER STERILE IRR 500ML POUR (IV SOLUTION) ×3 IMPLANT

## 2013-12-29 NOTE — Discharge Instructions (Signed)
Post Anesthesia Home Care Instructions  Activity: Get plenty of rest for the remainder of the day. A responsible adult should stay with you for 24 hours following the procedure.  For the next 24 hours, DO NOT: -Drive a car -Paediatric nurse -Drink alcoholic beverages -Take any medication unless instructed by your physician -Make any legal decisions or sign important papers.  Meals: Start with liquid foods such as gelatin or soup. Progress to regular foods as tolerated. Avoid greasy, spicy, heavy foods. If nausea and/or vomiting occur, drink only clear liquids until the nausea and/or vomiting subsides. Call your physician if vomiting continues.  Special Instructions/Symptoms: Your throat may feel dry or sore from the anesthesia or the breathing tube placed in your throat during surgery. If this causes discomfort, gargle with warm salt water. The discomfort should disappear within 24 hours. INSTRUCTIONS AFTER CRYOABLATION OF THE PROSTATE  Normal Findings After Cryoablation:   You may experience a number of symptoms after the cryoablation which occur in  some patients.  There is no cause for alarm should this happen.  These symptoms include:  -Blood in the urine.  When you are discharged after your surgery, your urine will have some blood in it and may appear red.  This occurs to some degree in all patients after cryoablation and is not cause for alarm.  Your urine should clear approximately 24 hours after the procedure, but may persist for quite a while longer.  -Scrotal and penile swelling and bruising.  This occurs about 2-3 days after the cryoablation and is caused by tissue swelling which temporarily blocks the drainage of lymph.  This is painless and resolves in less than one week.  Ice packs and lying down for short periods of time during the day will improve the swelling.  -Small amounts of bloody discharge from the end of your penis.  This can  occur for up to 6 weeks after the  procedure and is not cause for alarm.  It is due to some discharge from the urethra in the area of the prostate.  -Some numbness in the head of the penis.  Occasionally, when a large amount of freezing has been performed during the procedure, the nerve which supplies sensation to one or both sides of the head of the penis may be affected. The sensation returns after a number of months.  Call your doctor at [phone] if any of the following occur:               -If you have any pain, fever, or chills.  -If your foley catheter or suprapubic tube is not draining urine.  -If there is decreasing urinary stream.  This may indicate sloughing of some  dead tissue in the area of the prostate near the opening to the bladder.  It may clear on its own but,  if the problem becomes severe, it may require removal of the dead tissue through a cystoscope.  -If you have diarrhea after urination or foul-smelling urine.  These symptoms may indicate an urethrorectal fistula, which is a hole between the bladder channel and the rectum.  This should be investigated by your doctor.  -If you have any questions or problems.   Diet:  Resume your normal diet.   If you become constipated, you may try over-the-counter remedies such as Milk of Magnesia.  If you are nauseated, vomiting or feel bloated, notify your doctor's office.  DO NOT GIVE YOURSELF ANY ENEMAS OR RECTAL MEDICATIONS.  THE RECTAL WALL  IS THIN AFTER CRYOABLATION.  Activity:   -You may have swelling and bruising of the penis and scrotum.  Apply ice packs  to the area behind the scrotum intermittently for 24-48 hours after your surgery to keep the swelling down.  Wearing an athletic supporter (jock strap) might also help.  -Lying flat on your back will also help decrease the swelling.  You may find   when you are sitting up or walking around that the swelling increases.   -You will have  puncture wounds behind your scrotum making it painful to sit   down.  Use a  hemorrhoid donut to sit on if needed.  -You may shower on the second day after surgery.  -There are no lifting or driving restrictions.  Use caution while the suprapubic tube  is in place.   Medications:  -You may resume your preoperative medications, except aspirin and other blood   thinning agents.  Unless otherwise instructed, resume your aspirin after your suprapubic tube is removed.  If you are taking Coumadin or other blood thinners, please discuss when to restart these medications with your surgeon.  -Unless otherwise ordered, you will be given prescriptions for the following   medications which you will need to take after your procedure:   *Antibiotics -  to prevent infection while your suprapubic tube is in place.   *Anti-inflammatory - to prevent pain and to decrease the inflammation in  the area of the prostate.  You may take ibuprofen (Motrin, Advil), acetaminophen (Tylenol) or naproxen (Aleve) as needed for discomfort.   Call your doctor's office at [phone] for an appointment in *** weeks.  Patient Signature:  ________________________________________________________  Nurse's Signature:  ________________________________________________________

## 2013-12-29 NOTE — Anesthesia Postprocedure Evaluation (Signed)
Anesthesia Post Note  Patient: [name]  Procedure(s) Performed: Procedure(s) (LRB): CRYO ABLATION PROSTATE (N/A)  Anesthesia type: General  Patient location: PACU  Post pain: Pain level controlled  Post assessment: Post-op Vital signs reviewed  Last Vitals: BP 147/68  Pulse 76  Temp(Src) 36.4 C (Oral)  Resp 14  Ht 5\' 9"  (1.753 m)  Wt 183 lb (83.008 kg)  BMI 27.01 kg/m2  SpO2 92%  Post vital signs: Reviewed  Level of consciousness: sedated  Complications: No apparent anesthesia complications

## 2013-12-29 NOTE — Anesthesia Preprocedure Evaluation (Signed)
Anesthesia Evaluation  Patient identified by MRN, date of birth, ID band Patient awake    Reviewed: Allergy & Precautions, H&P , NPO status , Patient's Chart, lab work & pertinent test results  Airway Mallampati: II TM Distance: >3 FB Neck ROM: Full    Dental  (+) Dental Advisory Given   Pulmonary shortness of breath, COPD COPD inhaler, former smoker,  breath sounds clear to auscultation  + decreased breath sounds      Cardiovascular hypertension, Pt. on medications Rhythm:Regular Rate:Normal     Neuro/Psych negative neurological ROS  negative psych ROS   GI/Hepatic Neg liver ROS, GERD-  ,  Endo/Other  negative endocrine ROS  Renal/GU negative Renal ROS  negative genitourinary   Musculoskeletal negative musculoskeletal ROS (+)   Abdominal   Peds negative pediatric ROS (+)  Hematology negative hematology ROS (+)   Anesthesia Other Findings   Reproductive/Obstetrics negative OB ROS                           Anesthesia Physical Anesthesia Plan  ASA: III  Anesthesia Plan: General   Post-op Pain Management:    Induction: Intravenous  Airway Management Planned: LMA and Oral ETT  Additional Equipment:   Intra-op Plan:   Post-operative Plan: Extubation in OR  Informed Consent: I have reviewed the patients History and Physical, chart, labs and discussed the procedure including the risks, benefits and alternatives for the proposed anesthesia with the patient or authorized representative who has indicated his/her understanding and acceptance.   Dental advisory given  Plan Discussed with: CRNA  Anesthesia Plan Comments:         Anesthesia Quick Evaluation

## 2013-12-29 NOTE — Op Note (Signed)
[name] is a 78 y.o.   12/29/2013  General  Preop diagnosis: Adenocarcinoma of prostate, stage T2 a period  Postop diagnosis: Same  Procedure done: Salvage cryoablation of prostate, cystoscopy  Surgeon: Charlene Brooke. [name]  Anesthesia: General  Indication:  Patient is a 78 years old male who had IMRT in 2009 for adenocarcinoma of prostate Gleason 3+4 and PSA of 7.9. PSA nadir was 0.87 in April 2012. His PSA has been trending up and was 2.84 in January of this year. Bone scan showed no evidence of metastatic disease.  MRI showed no evidence of pelvic adenopathy or extra prostatic extension. Repeat biopsy showed a Gleason 4+5 at the left base. Patient is scheduled today for salvage cryoablation of the prostate  Procedure: the patient was identified by his wrist band and proper timeout was taken.  Under general anesthesia he was prepped and draped and placed in the dorsolithotomy position. A #18 French Foley catheter was inserted in the bladder. The transducer was inserted in the rectum. Ultrasound of the prostate revealed a prostate volume of 20 cc. Two ice rods were placed in the anterior lobe of the prostate. And 3 were placed in the posterior lobe. The tips of the ice rods were advanced up to about half cm to the level of the bladder neck. A temperature needle was placed in the external sphincter and another one was placed in the Denonvilliers fascia. The Foley catheter was then removed.  A flexible cystoscope was inserted in the bladder. The anterior urethra is normal. The prostate is moderately enlarged. The bladder mucosa is normal. There is no stone or tumor in the bladder. The ureteral orifices are in normal position and shape. With the cystoscope and in retroflexion, there was no evidence of penetration of the bladder neck with the needles. A super stiff wire was then passed through the cystoscope in the bladder and the cystoscope was removed. The urethral warmer catheter was then passed  over the wire in the bladder.  The first the freezing cycle was then started with the ice rods on the anterior lobe. The cycle lasted in total about 7 minutes and 26 seconds. Then the freezing of the prostate was continued on the posterior lobe with the outside ice rods. That cycle lasted about 4 minutes and 25 seconds. The middle ice rod freezing cycle lasted 1 minute and 54 seconds.  Then the thaw cycle lasted about 8 minutes.  The second freezing cycle was started on the anterior lobe and lasted about 4 minutes and 18 seconds. The freezing cycle on the outside ice rods on the posterior lobe lasted four minutes and 20 seconds. The freezing cycle on the middle ice rod lasted 2 minutes and 28 seconds. Then the second thaw cycle was started and lasted 8 minutes. Then the ice rods were removed as well as the temperature needles. The urethral warmer catheter was left in place for another 10 minutes. The catheter was then removed and a #18 [place] Foley catheter was inserted in the bladder.  Patient tolerated the procedure well and left the OR in satisfactory condition to postanesthesia care unit.

## 2013-12-29 NOTE — H&P (Signed)
History of Present Illness [name] returns today to discuss MRI results. He was treated with IMRT from 10/5 through 12/1/ 2009 for adenocarcinoma of prostate Gleason 3+4 and PSA of 7.9. PSA nadir was 0.87 in April 2012. His PSA has been trending up and was 2.84 in January. Repeat biopsy showed Gleason 4+5 at the left base. Bone scan revealed no bony metastasis. MRI showed no evidence of pelvic adenopathy or extraprostatic extension. I discussed treatment options with [name]. He understands that he can no longer have radiation. He could have salvage cryoablation. I could refer him to the [facility] for further discussion. He states that he is OK with cryoablation. Will get medical clearance with [name] before scheduling the procedure. If he is a high surgical risk will treat him with active surveillance. If he would develop metastasis we would then consider androgen deprivation.   Past Medical History Problems  1. History of Arthritis (V13.4) 2. History of Asthma (493.90) 3. History of Chronic obstructive pulmonary disease (496) 4. History of chronic bronchitis (V12.69) 5. History of heartburn (V12.79) 6. History of hypercholesterolemia (V12.29) 7. History of hypertension (V12.59)  Surgical History Problems  1. History of Cataract Surgery  Current Meds 1. Adult Aspirin Low Strength 81 MG TBDP;  Therapy: (Recorded:16Jun2009) to Recorded 2. Centrum Silver Oral Tablet Chewable;  Therapy: (Recorded:16Jun2009) to Recorded 3. ClonazePAM TABS;  Therapy: (Recorded:22Apr2013) to Recorded 4. Cozaar 25 MG Oral Tablet;  Therapy: (Recorded:16Jun2009) to Recorded 5. Diazepam 10 MG Oral Tablet; Take this medication with you to your appointment;  Therapy: 28Apr2015 to (Evaluate:29Apr2015); Last Rx:28Apr2015 Ordered 6. DiphenhydrAMINE HCl - 50 MG Oral Capsule;  Therapy: (Recorded:22Apr2013) to Recorded 7. Doxazosin Mesylate 4 MG Oral Tablet;  Therapy:  (Recorded:16Jun2009) to Recorded 8. Fosamax 70 MG Oral Tablet;  Therapy: (Recorded:16Jun2009) to Recorded 9. Hydrochlorothiazide CAPS;  Therapy: (Recorded:16Jun2009) to Recorded 10. Levofloxacin 500 MG Oral Tablet; 1 tablet the day before the procedure, 1 tablet the day of   the procedure, and 1 tablet the day after the procedure;   Therapy: [id number] to (Last Rx:03Feb2015) Ordered 11. Mometasone Furoate SOLN;   Therapy: (Recorded:11Mar2010) to Recorded 12. Multi-Vitamin Oral Tablet;   Therapy: (Recorded:22Apr2013) to Recorded 13. Nystatin-Triamcinolone 100000-0.1 UNIT/GM-% External Cream; APPLY SPARINGLY TO   AFFECTED AREA(S) 3 TIMES A DAY;   Therapy: 14Apr2014 to (Evaluate:15Apr2014)  Requested for: 14Apr2014; Last   Rx:14Apr2014 Ordered 14. Omeprazole 20 MG Oral Capsule Delayed Release;   Therapy: (Recorded:16Jun2009) to Recorded 15. Oscal 500/200 D-3 TABS;   Therapy: (Recorded:16Jun2009) to Recorded 16. Proventil NEBU;   Therapy: (Recorded:16Jun2009) to Recorded 17. Pulmicort 0.25 MG/2ML Inhalation Suspension;   Therapy: (Recorded:22Apr2013) to Recorded 18. Red Yeast Rice CAPS;   Therapy: (Recorded:16Jun2009) to Recorded 19. Vitamin D TABS;   Therapy: (Recorded:22Apr2013) to Recorded  Allergies Medication  1. No Known Drug Allergies  Family History Problems  1. Family history of Death In The Family Father : Father   61- prostate cancer lung cancer 2. Family history of Death In The Family Mother : Father   75-gangrene 3. Family history of Prostate Cancer (Q22.29) : Father  Social History Problems  1. Alcohol Use   1 daily 2. Caffeine Use   2 daily 3. Former smoker (V15.82) 4. Marital History - Divorced (V61.03) 5. Occupation:   retired 47. Tobacco Use (V15.82)   1/2 ppdx 25 years=quit 27 years  Review of Systems Genitourinary, constitutional, skin, eye, otolaryngeal, hematologic/lymphatic, cardiovascular, pulmonary, endocrine, musculoskeletal,  gastrointestinal, neurological and psychiatric system(s) were  reviewed and pertinent findings if present are noted.    Results/Data Urine [Data Includes: Last 1 Day]   [id number] COLOR YELLOW  APPEARANCE CLEAR  SPECIFIC GRAVITY 1.020  pH 7.0  GLUCOSE NEG mg/dL BILIRUBIN NEG  KETONE NEG mg/dL BLOOD NEG  PROTEIN NEG mg/dL UROBILINOGEN 0.2 mg/dL NITRITE NEG  LEUKOCYTE ESTERASE NEG   Assessment Assessed  1. Prostate cancer (185)  Plan Health Maintenance  1. UA With REFLEX; [Do Not Release]; Status:Complete;   Done: [id number] 01:53PM  Salvage cryoablation if medically clear.

## 2013-12-29 NOTE — Anesthesia Procedure Notes (Addendum)
Procedure Name: LMA Insertion Date/Time: 12/29/2013 8:53 AM Performed by: Mechele Claude Pre-anesthesia Checklist: Patient identified, Emergency Drugs available, Suction available and Patient being monitored Patient Re-evaluated:Patient Re-evaluated prior to inductionOxygen Delivery Method: Circle System Utilized Preoxygenation: Pre-oxygenation with 100% oxygen Intubation Type: IV induction Ventilation: Mask ventilation without difficulty LMA: LMA inserted LMA Size: 5.0 Number of attempts: 1 Airway Equipment and Method: bite block Placement Confirmation: positive ETCO2 Tube secured with: Tape Dental Injury: Teeth and Oropharynx as per pre-operative assessment    Procedure Name: Intubation Date/Time: 12/29/2013 9:49 AM Performed by: Mechele Claude Pre-anesthesia Checklist: Patient identified, Emergency Drugs available, Suction available and Patient being monitored Patient Re-evaluated:Patient Re-evaluated prior to inductionOxygen Delivery Method: Circle System Utilized Preoxygenation: Pre-oxygenation with 100% oxygen Intubation Type: IV induction Ventilation: Mask ventilation without difficulty Laryngoscope Size: Mac and 4 Grade View: Grade I Tube type: Oral Number of attempts: 1 Airway Equipment and Method: stylet Placement Confirmation: ETT inserted through vocal cords under direct vision,  positive ETCO2 and breath sounds checked- equal and bilateral Secured at: 23 cm Tube secured with: Tape Dental Injury: Teeth and Oropharynx as per pre-operative assessment

## 2013-12-29 NOTE — Transfer of Care (Signed)
Immediate Anesthesia Transfer of Care Note  Patient: [name]  Procedure(s) Performed: Procedure(s) (LRB): CRYO ABLATION PROSTATE (N/A)  Patient Location: PACU  Anesthesia Type: General  Level of Consciousness: awake, alert  and oriented  Airway & Oxygen Therapy: Patient Spontanous Breathing and Patient connected to face mask oxygen  Post-op Assessment: Report given to PACU RN and Post -op Vital signs reviewed and stable  Post vital signs: Reviewed and stable  Complications: No apparent anesthesia complications

## 2014-01-01 ENCOUNTER — Encounter (HOSPITAL_BASED_OUTPATIENT_CLINIC_OR_DEPARTMENT_OTHER): Payer: Self-pay | Admitting: Urology

## 2014-06-20 ENCOUNTER — Encounter: Payer: Self-pay | Admitting: Internal Medicine

## 2014-06-20 ENCOUNTER — Ambulatory Visit (INDEPENDENT_AMBULATORY_CARE_PROVIDER_SITE_OTHER): Payer: Medicare HMO | Admitting: Internal Medicine

## 2014-06-20 VITALS — BP 134/68 | HR 71 | Ht 68.75 in | Wt 191.0 lb

## 2014-06-20 DIAGNOSIS — J449 Chronic obstructive pulmonary disease, unspecified: Secondary | ICD-10-CM

## 2014-06-20 NOTE — Patient Instructions (Signed)
You can check with the [facility] to see if you have had the new Prevnar 13 pneumonia vaccine. This is a one-time shot, so we won't repeat it now. If you have not yet had it, then we can give it here or you can get it through the [facility]. If you do get it please let us know for our records.

## 2014-06-20 NOTE — Progress Notes (Signed)
04/09/11-78 year old male former smoker followed for asthma/COPD, insomnia, complicated by HBP, chronic gastritis, GERD Last here 04/11/2010 Has had flu vaccine He reports increased sinus drainage in April. He saw an ENT doctor in [place] at [facility] who did a CT. That problem has been doing better. He has persistent sense of chest congestion especially in the right lung. Productive cough affects his sleep. Coughing yellow or sometimes darker sputum. He denies reflux, fever, swollen glands or blood. Weight is down 7 or 8 pounds. Poor sense of smell has decreased his appetite. [facility] has also treated his bronchitis/COPD with prednisone and antibiotic without help. He has taken prednisone tapers twice and antibiotics twice. None of this has made a big difference in his cough.  05/18/11- 78 year old male former smoker followed for asthma/COPD, insomnia, complicated by HBP, chronic gastritis, GERD At last visit he needed a nebulizer treatment and Depo-Medrol, and we had recommended saline nasal lavage. These did well. He says now that he is "better than I was, just comes and goes now". Sputum had grown a pneumococcus strongly resistant to penicillin and cephalosporins. We had sent Levaquin x10 days. -he just finished this morning. Walking on a treadmill helps him loosen the sputum. He has had pneumonia vaccine in 1984 and 1994. He says "Mucinex tears my lungs up". CBC 04/09/2011-WBC 8500 Chest x-ray: 04/09/2011-COPD without acute process.  08/18/11- 79 year old male former smoker followed for asthma/COPD, insomnia, complicated by HBP, chronic gastritis, GERD [name] overstimulated and made him jittery. He is trying to take one half pill daily because it did reduce his mucus production. Cough with white sputum. Reduced dose did not help his nervousness. Some tachycardia palpitation. Weight down 7 or 8 pounds. Blames [name] for increased reflux. Exercising on treadmill 20 minutes daily. Gets  shifting pains around anterior chest up into the shoulders, not clearly related to activity or exertion.  11/17/11- 78 year old male former smoker followed for asthma/COPD, insomnia, complicated by HBP, chronic gastritis, GERD Sob,wheezing upon activity and weather.  He quit [name] because it was making him depressed. Otherwise says he is "pretty good". Chest pain and tightness have improved. Sinus drainage stopped with over-the-counter antihistamines. He is walking 2 miles each morning and 1 mild each evening. Taking Benadryl at bedtime. CAT COPD score 29. CXR 04/13/11- Reviewed: IMPRESSION:  Chronic obstructive pulmonary disease without acute superimposed  process.  Original Report Authenticated By: Vivia Ewing, M.D.   03/21/12- 78 year old male former smoker followed for chronic bronchitis/COPD, insomnia, complicated by HBP, chronic gastritis, GERD Productive cough-yellow to green in color; wheezing, SOB, congestion-chest only(feels like Right side mainly) COPD assessment test (CABG) score 35/40 Sensitive to temperature and weather changes triggering cough. Sputum is chronic, usually yellow green with no blood. He did not tolerate [name]  06/20/12- 78 year old male former smoker followed for chronic bronchitis/COPD, insomnia, complicated by HBP, chronic gastritis, GERD FOLLOWS FOR: breathing is doing fine. denies chest pain, chest tightness, increased SOB. Feels "fine today". Antibiotic cleared infection. On prednisone taper for cervical arthritis. CXR 03/25/12-  IMPRESSION:  1. Bibasilar opacities are new from previous exam and may  represent atelectasis or infiltrate.  Original Report Authenticated By: Angelita Ingles, M.D.   12/19/12- 78 year old male former smoker followed for chronic bronchitis/COPD, insomnia, complicated by HBP, chronic gastritis, GERD FOLLOWS FOR: heat and humidity making patient more SOB and full of congestion; does better in cooler temps Steroid  injection for back pain did not help. Cough remains  very productive especially when lying down. Chronic  insomnia better with clonazepam.  06/20/13- 78 year old male former smoker followed for chronic bronchitis/COPD, insomnia, complicated by HBP, chronic gastritis, GERD Follows for: 6 month rov.  States his breathing problems "come and go".  Pt denies any congestion, SOB at this time.  He continues daily acid blocker with history of remote reflux but no recent significant problem recognized. Occasional sweats. Productive cough everyday, worse lying down at night. Sputum is usually white or yellow with no blood and no fever or chest pain. CXR 12/30/12 Findings: Trachea is midline. Heart size stable. Scarring mild  volume loss are seen in both lung bases. Biapical pleural  thickening. Lungs are otherwise clear. No pleural fluid.  Degenerative changes are seen in the spine.  IMPRESSION:  No acute findings.  Original Report Authenticated By: Lorin Picket, M.D.   12/19/13-78 year old male former smoker followed for chronic bronchitis/COPD, insomnia, complicated by HBP, chronic gastritis, GERD FOLLOWS FOR: Congestion, hard time breathing with heat and humidity. Says no real change in chronic cough,, productive white, non-bloody phlegm. Hot, humid weather just makes it more uncomfortable. No chest pain or palpitation. Says nebs and Flutter device not helpful. "Best thing is a shot of white liquor"- relaxes airways so he can cough productively. Has tried mucinex. Labs for a1AT def and Cystic Fibrosis genes were normal/ neg 06/20/13.  06/20/14- 78 year old male former smoker followed for chronic bronchitis/COPD, insomnia, complicated by HBP, chronic gastritis, GERD FOLLOWS FOR: pt c/o prod cough with white mucus, sob with exertion.  states this is stable and normal for him.  He gets his meds from the [facility]. CXR 12/19/13 IMPRESSION: Stable appearance of the chest. No evidence for acute  abnormality. Electronically Signed  By: Shon Hale M.D.  On: 12/19/2013 13:09  ROS-See HPI Constitutional:   No-   weight loss, night sweats, fevers, chills, fatigue, lassitude. HEENT:   No-  headaches, difficulty swallowing, tooth/dental problems, sore throat,       No-  sneezing, itching, ear ache,     no current- nasal congestion, post nasal drip,  CV:  No-chest pain, orthopnea, PND, swelling in lower extremities, anasarca, dizziness, palpitations Resp: + shortness of breath with exertion or at rest.              +Persistent  productive cough,  No non-productive cough,  No- coughing up of blood.              No-change in color of mucus.  No- wheezing.   Skin: No-   rash or lesions. GI: + heartburn, indigestion,  No-abdominal pain, nausea, vomiting,  GU:  MS:  No-   joint pain or swelling.    + back pain. Neuro-     nothing unusual Psych:  No- change in mood or affect. No depression or anxiety.  No memory loss.  OBJ General- Alert, Oriented, Affect-appropriate, Distress- none acute; overweight Skin- rash-none, lesions- none, excoriation- none Lymphadenopathy- none Head- atraumatic            Eyes- Gross vision intact, PERRLA, conjunctivae clear secretions            Ears- Hearing, canals-normal            Nose- Clear, no-Septal dev, mucus, polyps, erosion, perforation             Throat- Mallampati III , mucosa cobblestoned , drainage- none, tonsils- atrophic Neck- flexible , trachea midline, no stridor , thyroid nl, carotid no bruit Chest - symmetrical excursion , unlabored  Heart/CV- RRR , no murmur , no gallop  , no rub, nl s1 s2                           - JVD- none , edema- none, stasis changes- none, varices- none           Lung-+ bibasilar rhonchi,+ slow exhalation, Unlabored , dullness-none, rub- none           Chest wall-  Abd-  Br/ Gen/ Rectal- Not done, not indicated Extrem- cyanosis- none, clubbing, none, atrophy- none, strength- nl Neuro- grossly  intact to observation

## 2014-06-22 NOTE — Assessment & Plan Note (Signed)
He is getting medications from the [facility] and agrees to check with the [facility] about whether he has had Prevnar 13 pneumonia vaccine yet.

## 2014-10-01 ENCOUNTER — Telehealth: Payer: Self-pay | Admitting: Internal Medicine

## 2014-10-01 NOTE — Telephone Encounter (Signed)
lmtcb x1 Need to know what date he has this injection.

## 2014-10-01 NOTE — Telephone Encounter (Signed)
lmtcb X2 for pt.   Need to know if he had the Prevnar or Pneumovax.

## 2014-10-01 NOTE — Telephone Encounter (Signed)
Pt cb, [phone]

## 2014-10-01 NOTE — Telephone Encounter (Signed)
Called pt and he reports he received the prevnar 13 about 1 year ago. This has been documented in chart. Nothing further needed

## 2014-10-01 NOTE — Telephone Encounter (Signed)
Pt calling stating that the injection was about 62mo ago he is not sure of the exact date.[name]

## 2015-06-21 ENCOUNTER — Encounter: Payer: Self-pay | Admitting: Internal Medicine

## 2015-06-21 ENCOUNTER — Ambulatory Visit (INDEPENDENT_AMBULATORY_CARE_PROVIDER_SITE_OTHER)
Admission: RE | Admit: 2015-06-21 | Discharge: 2015-06-21 | Disposition: A | Discharge status: Home / Self Care | Payer: Medicare HMO | Source: Ambulatory Visit | Attending: Internal Medicine | Admitting: Internal Medicine

## 2015-06-21 ENCOUNTER — Ambulatory Visit (INDEPENDENT_AMBULATORY_CARE_PROVIDER_SITE_OTHER): Payer: Medicare HMO | Admitting: Internal Medicine

## 2015-06-21 VITALS — BP 136/78 | HR 64 | Ht 68.75 in | Wt 194.8 lb

## 2015-06-21 DIAGNOSIS — K219 Gastro-esophageal reflux disease without esophagitis: Secondary | ICD-10-CM

## 2015-06-21 DIAGNOSIS — J449 Chronic obstructive pulmonary disease, unspecified: Secondary | ICD-10-CM | POA: Diagnosis not present

## 2015-06-21 DIAGNOSIS — J471 Bronchiectasis with (acute) exacerbation: Secondary | ICD-10-CM

## 2015-06-21 NOTE — Patient Instructions (Addendum)
I will check to see if you could be a candidate for a pneumatic vest that would help you keep your lungs clearer so you can lie down to sleep  Order- CXR   Dx bronchiectasis exacerbation  Order-schedule CT chest, no contrast, DX bronchiectasis

## 2015-06-21 NOTE — Assessment & Plan Note (Signed)
He is controlling symptoms following reflux precautions. We reminded about these again today.

## 2015-06-21 NOTE — Assessment & Plan Note (Signed)
Quality of life is being significantly impaired (unable to lie down to sleep) because he is unable to manage airway secretions. Flutter device, efforts at postural drainage and physical therapy, antibiotics and bronchodilators have not helped. This is been going on for years. Clinically he has bronchiectasis. I think he would be a good candidate for a pneumatic vest device but he will need a CT scan to document bronchiectasis Plan-CT chest

## 2015-06-21 NOTE — Addendum Note (Signed)
Addended by: Inge Rise on: 06/21/2015 03:37 PM   Modules accepted: Orders

## 2015-06-21 NOTE — Progress Notes (Signed)
04/09/11-79 year old male former smoker followed for asthma/COPD, insomnia, complicated by HBP, chronic gastritis, GERD Last here 04/11/2010 Has had flu vaccine He reports increased sinus drainage in April. He saw an ENT doctor in [place] at [facility] who did a CT. That problem has been doing better. He has persistent sense of chest congestion especially in the right lung. Productive cough affects his sleep. Coughing yellow or sometimes darker sputum. He denies reflux, fever, swollen glands or blood. Weight is down 7 or 8 pounds. Poor sense of smell has decreased his appetite. [facility] has also treated his bronchitis/COPD with prednisone and antibiotic without help. He has taken prednisone tapers twice and antibiotics twice. None of this has made a big difference in his cough.  05/18/11- 79 year old male former smoker followed for asthma/COPD, insomnia, complicated by HBP, chronic gastritis, GERD At last visit he needed a nebulizer treatment and Depo-Medrol, and we had recommended saline nasal lavage. These did well. He says now that he is "better than I was, just comes and goes now". Sputum had grown a pneumococcus strongly resistant to penicillin and cephalosporins. We had sent Levaquin x10 days. -he just finished this morning. Walking on a treadmill helps him loosen the sputum. He has had pneumonia vaccine in 1984 and 1994. He says "Mucinex tears my lungs up". CBC 04/09/2011-WBC 8500 Chest x-ray: 04/09/2011-COPD without acute process.  08/18/11- 79 year old male former smoker followed for asthma/COPD, insomnia, complicated by HBP, chronic gastritis, GERD [name] overstimulated and made him jittery. He is trying to take one half pill daily because it did reduce his mucus production. Cough with white sputum. Reduced dose did not help his nervousness. Some tachycardia palpitation. Weight down 7 or 8 pounds. Blames [name] for increased reflux. Exercising on treadmill 20 minutes daily. Gets  shifting pains around anterior chest up into the shoulders, not clearly related to activity or exertion.  11/17/11- 79 year old male former smoker followed for asthma/COPD, insomnia, complicated by HBP, chronic gastritis, GERD Sob,wheezing upon activity and weather.  He quit [name] because it was making him depressed. Otherwise says he is "pretty good". Chest pain and tightness have improved. Sinus drainage stopped with over-the-counter antihistamines. He is walking 2 miles each morning and 1 mild each evening. Taking Benadryl at bedtime. CAT COPD score 29. CXR 04/13/11- Reviewed: IMPRESSION:  Chronic obstructive pulmonary disease without acute superimposed  process.  Original Report Authenticated By: Vivia Ewing, M.D.   03/21/12- 79 year old male former smoker followed for chronic bronchitis/COPD, insomnia, complicated by HBP, chronic gastritis, GERD Productive cough-yellow to green in color; wheezing, SOB, congestion-chest only(feels like Right side mainly) COPD assessment test (CABG) score 35/40 Sensitive to temperature and weather changes triggering cough. Sputum is chronic, usually yellow green with no blood. He did not tolerate [name]  06/20/12- 79 year old male former smoker followed for chronic bronchitis/COPD, insomnia, complicated by HBP, chronic gastritis, GERD FOLLOWS FOR: breathing is doing fine. denies chest pain, chest tightness, increased SOB. Feels "fine today". Antibiotic cleared infection. On prednisone taper for cervical arthritis. CXR 03/25/12-  IMPRESSION:  1. Bibasilar opacities are new from previous exam and may  represent atelectasis or infiltrate.  Original Report Authenticated By: Angelita Ingles, M.D.   12/19/12- 79 year old male former smoker followed for chronic bronchitis/COPD, insomnia, complicated by HBP, chronic gastritis, GERD FOLLOWS FOR: heat and humidity making patient more SOB and full of congestion; does better in cooler temps Steroid  injection for back pain did not help. Cough remains  very productive especially when lying down. Chronic  insomnia better with clonazepam.  06/20/13- 79 year old male former smoker followed for chronic bronchitis/COPD, insomnia, complicated by HBP, chronic gastritis, GERD Follows for: 6 month rov.  States his breathing problems "come and go".  Pt denies any congestion, SOB at this time.  He continues daily acid blocker with history of remote reflux but no recent significant problem recognized. Occasional sweats. Productive cough everyday, worse lying down at night. Sputum is usually white or yellow with no blood and no fever or chest pain. CXR 12/30/12 Findings: Trachea is midline. Heart size stable. Scarring mild  volume loss are seen in both lung bases. Biapical pleural  thickening. Lungs are otherwise clear. No pleural fluid.  Degenerative changes are seen in the spine.  IMPRESSION:  No acute findings.  Original Report Authenticated By: Lorin Picket, M.D.   12/19/13-79 year old male former smoker followed for chronic bronchitis/COPD, insomnia, complicated by HBP, chronic gastritis, GERD FOLLOWS FOR: Congestion, hard time breathing with heat and humidity. Says no real change in chronic cough,, productive white, non-bloody phlegm. Hot, humid weather just makes it more uncomfortable. No chest pain or palpitation. Says nebs and Flutter device not helpful. "Best thing is a shot of white liquor"- relaxes airways so he can cough productively. Has tried mucinex. Labs for a1AT def and Cystic Fibrosis genes were normal/ neg 06/20/13.  06/20/14- 79 year old male former smoker followed for chronic bronchitis/COPD, insomnia, complicated by HBP, chronic gastritis, GERD FOLLOWS FOR: pt c/o prod cough with white mucus, sob with exertion.  states this is stable and normal for him.  He gets his meds from the [place]. CXR 12/19/13 IMPRESSION: Stable appearance of the chest. No evidence for acute  abnormality. Electronically Signed  By: Shon Hale M.D.  On: 12/19/2013 13:09  06/21/2015-79 year old male former smoker followed for chronic bronchitis/COPD, insomnia,, K by HBP, chronic gastritis, GERD FOLLOWS FOR: Pt c/o increased congestion with white mucus and cough. Pt states that he has to sit straight up d/t pressure on lungs. Pt states that he coughs for about 1.5 hrs when he lays down and tries to go to sleep.  Daily routine reductive cough unchanged over many years. Hard to sleep because of this cough and having to clear his chest. Sleeps in a recliner. Flutter device did not work well. He hasn't seen much benefit from antibiotics. Modest benefit from Symbicort plus rescue inhaler he gets through [place]. [facility] and he has not reported recent obvious reflux/aspiration episode. He continues to follow reflux precautions.  ROS-See HPI Constitutional:   No-   weight loss, night sweats, fevers, chills, fatigue, +lassitude. HEENT:   No-  headaches, difficulty swallowing, tooth/dental problems, sore throat,       No-  sneezing, itching, ear ache,     no current- nasal congestion, post nasal drip,  CV:  No-chest pain, orthopnea, PND, swelling in lower extremities, anasarca, dizziness, palpitations Resp: + shortness of breath with exertion or at rest.              +Persistent  productive cough,  No non-productive cough,  No- coughing up of blood.              No-change in color of mucus.  No- wheezing.   Skin: No-   rash or lesions. GI: + heartburn, indigestion,  No-abdominal pain, nausea, vomiting,  GU:  MS:  No-   joint pain or swelling.    + back pain. Neuro-     nothing unusual Psych:  No- change in  mood or affect. No depression or anxiety.  No memory loss.  OBJ General- Alert, Oriented, Affect-appropriate, Distress- none acute; overweight Skin- rash-none, lesions- none, excoriation- none Lymphadenopathy- none Head- atraumatic            Eyes- Gross  vision intact, PERRLA, conjunctivae clear secretions            Ears- Hearing, canals-normal            Nose- Clear, no-Septal dev, mucus, polyps, erosion, perforation             Throat- Mallampati III , mucosa cobblestoned , drainage- none, tonsils- atrophic Neck- flexible , trachea midline, no stridor , thyroid nl, carotid no bruit Chest - symmetrical excursion , unlabored           Heart/CV- RRR , no murmur , no gallop  , no rub, nl s1 s2                           - JVD- none , edema- none, stasis changes- none, varices- none           Lung-+ bibasilar rhonchi,+ slow exhalation, Unlabored , dullness-none, rub- none           Chest wall-  Abd-  Br/ Gen/ Rectal- Not done, not indicated Extrem- cyanosis- none, clubbing, none, atrophy- none, strength- nl Neuro- grossly intact to observation

## 2015-06-27 ENCOUNTER — Ambulatory Visit (HOSPITAL_COMMUNITY)
Admission: RE | Admit: 2015-06-27 | Discharge: 2015-06-27 | Disposition: A | Discharge status: Home / Self Care | Payer: Medicare HMO | Source: Ambulatory Visit | Attending: Internal Medicine | Admitting: Internal Medicine

## 2015-06-27 ENCOUNTER — Inpatient Hospital Stay: Admission: RE | Admit: 2015-06-27 | Payer: Medicare HMO | Source: Ambulatory Visit

## 2015-06-27 DIAGNOSIS — J471 Bronchiectasis with (acute) exacerbation: Secondary | ICD-10-CM | POA: Diagnosis present

## 2015-07-01 ENCOUNTER — Other Ambulatory Visit: Payer: Self-pay | Admitting: Internal Medicine

## 2015-07-01 ENCOUNTER — Encounter: Payer: Self-pay | Admitting: Internal Medicine

## 2015-07-01 DIAGNOSIS — J189 Pneumonia, unspecified organism: Secondary | ICD-10-CM

## 2015-07-01 DIAGNOSIS — J471 Bronchiectasis with (acute) exacerbation: Secondary | ICD-10-CM | POA: Insufficient documentation

## 2015-07-01 DIAGNOSIS — J479 Bronchiectasis, uncomplicated: Secondary | ICD-10-CM | POA: Insufficient documentation

## 2015-07-01 NOTE — Addendum Note (Signed)
Addended by: Clayborne Dana C on: 07/01/2015 02:05 PM   Modules accepted: Orders

## 2015-07-02 ENCOUNTER — Telehealth: Payer: Self-pay | Admitting: Internal Medicine

## 2015-07-02 NOTE — Telephone Encounter (Signed)
Result Notes     Notes Recorded by Glean Hess, CMA on 07/01/2015 at 4:17 PM Left message for patient to call back.  ------  Notes Recorded by Deneise Lever, MD on 06/28/2015 at 3:59 PM CT chest- does show bronchiectasis which should help to qualify for pneumatic vest therapy to help clear secretions from airways. There is also atherosclerosis calcium deposits inheart arteries).     Pt is aware of results. States that he was contacted this AM about vest therapy. Nothing further was needed.

## 2016-03-25 ENCOUNTER — Other Ambulatory Visit: Payer: Self-pay | Admitting: Urology

## 2016-03-25 DIAGNOSIS — C61 Malignant neoplasm of prostate: Secondary | ICD-10-CM

## 2016-04-08 ENCOUNTER — Encounter (HOSPITAL_COMMUNITY)
Admission: RE | Admit: 2016-04-08 | Discharge: 2016-04-08 | Disposition: A | Discharge status: Home / Self Care | Payer: Medicare HMO | Source: Ambulatory Visit | Attending: Urology | Admitting: Urology

## 2016-04-08 DIAGNOSIS — C61 Malignant neoplasm of prostate: Secondary | ICD-10-CM | POA: Insufficient documentation

## 2016-04-08 MED ORDER — TECHNETIUM TC 99M MEDRONATE IV KIT
25.0000 | PACK | Freq: Once | INTRAVENOUS | Status: AC | PRN
  Administered 2016-04-08: 21.9 via INTRAVENOUS

## 2016-06-22 HISTORY — PX: OTHER SURGICAL HISTORY: SHX169

## 2016-06-23 ENCOUNTER — Encounter: Payer: Self-pay | Admitting: Internal Medicine

## 2016-06-23 ENCOUNTER — Ambulatory Visit (INDEPENDENT_AMBULATORY_CARE_PROVIDER_SITE_OTHER): Payer: Medicare HMO | Admitting: Internal Medicine

## 2016-06-23 DIAGNOSIS — J449 Chronic obstructive pulmonary disease, unspecified: Secondary | ICD-10-CM

## 2016-06-23 MED ORDER — GLYCOPYRROLATE-FORMOTEROL 9-4.8 MCG/ACT IN AERO: 2.0000 | INHALATION_SPRAY | Freq: Two times a day (BID) | RESPIRATORY_TRACT | 0 refills | Status: DC

## 2016-06-23 NOTE — Progress Notes (Signed)
HPI male former smoker followed for chronic bronchitis/COPD, insomnia,, complicated  by HBP, ASCVD, chronic gastritis, GERD PFT 04/12/08-FVC 3.57/82%, FEV1 2.36/80%, ratio or 0.66, FEF 25-75% 1.22/46%, TLC 67%, DLCO 95% CT chest 06/27/2015-Mild cylindrical bronchiectasis throughout the lung bases  -----------------------------------------------------------------------------------------------------------------------  06/21/2015-81 year old male former smoker followed for chronic bronchitis/COPD, insomnia,, K by HBP, chronic gastritis, GERD FOLLOWS FOR: Pt c/o increased congestion with white mucus and cough. Pt states that he has to sit straight up d/t pressure on lungs. Pt states that he coughs for about 1.5 hrs when he lays down and tries to go to sleep.  Daily routine reductive cough unchanged over many years. Hard to sleep because of this cough and having to clear his chest. Sleeps in a recliner. Flutter device did not work well. He hasn't seen much benefit from antibiotics. Modest benefit from Symbicort plus rescue inhaler he gets through [place]. [facility] and he has not reported recent obvious reflux/aspiration episode. He continues to follow reflux precautions.  06/23/2016-81 year old male [place] former smoker followed for chronic bronchitis/COPD, insomnia, complicated by HBP, chronic gastritis, GERD, atherosclerosis, prostate cancer FOLLOWS FOR: Pt states he has SOB, chest tightness that comes and goes, cough with white mucus and has to keep a spit cup with him. Denies fever. Pneumatic vest is not enough help and seems to irritate his airway. Flutter device does not help. He paces his activity. CT chest 06/27/2015 IMPRESSION: 1. Mild cylindrical bronchiectasis throughout the lung bases bilaterally with what appears to be chronic post infectious or inflammatory scarring. 2. Atherosclerosis, including left main and 2 vessel coronary artery disease. Assessment for  potential risk factor modification, dietary therapy or pharmacologic therapy may be warranted, if clinically indicated. 3. Sequela of old granulomatous disease, as above.   ROS-See HPI Constitutional:   No-   weight loss, night sweats, fevers, chills, fatigue, +lassitude. HEENT:   No-  headaches, difficulty swallowing, tooth/dental problems, sore throat,       No-  sneezing, itching, ear ache,     no current- nasal congestion, post nasal drip,  CV:  No-chest pain, orthopnea, PND, swelling in lower extremities, anasarca, dizziness, palpitations Resp: + shortness of breath with exertion or at rest.              +Persistent  productive cough,  No non-productive cough,  No- coughing up of blood.              No-change in color of mucus.  No- wheezing.   Skin: No-   rash or lesions. GI: + heartburn, indigestion,  No-abdominal pain, nausea, vomiting,  GU:  MS:  No-   joint pain or swelling.    + back pain. Neuro-     nothing unusual Psych:  No- change in mood or affect. No depression or anxiety.  No memory loss.  OBJ General- Alert, Oriented, Affect-appropriate, Distress- none acute; overweight Skin- rash-none, lesions- none, excoriation- none Lymphadenopathy- none Head- atraumatic            Eyes- Gross vision intact, PERRLA, conjunctivae clear secretions            Ears- Hearing, canals-normal            Nose- Clear, no-Septal dev, mucus, polyps, erosion, perforation             Throat- Mallampati III , mucosa cobblestoned , drainage- none, tonsils- atrophic Neck- flexible , trachea midline, no stridor , thyroid nl, carotid no bruit Chest - symmetrical excursion ,  unlabored           Heart/CV- RRR , no murmur , no gallop  , no rub, nl s1 s2                           - JVD- none , edema- none, stasis changes- none, varices- none           Lung-+ bibasilar rhonchi,+ slow exhalation, Unlabored , dullness-none, rub- none, + wet rattling laugh           Chest wall-  Abd-  Br/ Gen/ Rectal-  Not done, not indicated Extrem- cyanosis- none, clubbing, none, atrophy- none, strength- nl Neuro- grossly intact to observation

## 2016-06-23 NOTE — Patient Instructions (Signed)
Sample Bevespi inhaler      Inhale 2 puffs, twice daily    Try this instead of Symbicort. When the sample runs out, go back to Symbicort and see which you like better. If you like Bevespi, see if you can get it through the [place].  Please call as needed

## 2016-08-24 DIAGNOSIS — E785 Hyperlipidemia, unspecified: Secondary | ICD-10-CM | POA: Insufficient documentation

## 2016-10-18 NOTE — Assessment & Plan Note (Signed)
Disappointing that he doesn't have more dramatic benefit from flutter device or pneumatic vest. We will see if LAMA inhaler can reduce secretions

## 2017-06-23 ENCOUNTER — Encounter: Payer: Self-pay | Admitting: Internal Medicine

## 2017-06-23 ENCOUNTER — Ambulatory Visit: Payer: Medicare HMO | Admitting: Internal Medicine

## 2017-06-23 ENCOUNTER — Ambulatory Visit (INDEPENDENT_AMBULATORY_CARE_PROVIDER_SITE_OTHER)
Admission: RE | Admit: 2017-06-23 | Discharge: 2017-06-23 | Disposition: A | Discharge status: Home / Self Care | Payer: Medicare HMO | Source: Ambulatory Visit | Attending: Internal Medicine | Admitting: Internal Medicine

## 2017-06-23 VITALS — BP 140/64 | HR 74 | Ht 68.0 in | Wt 192.4 lb

## 2017-06-23 DIAGNOSIS — J471 Bronchiectasis with (acute) exacerbation: Secondary | ICD-10-CM

## 2017-06-23 MED ORDER — AMOXICILLIN-POT CLAVULANATE 875-125 MG PO TABS: 1.0000 | ORAL_TABLET | Freq: Two times a day (BID) | ORAL | 0 refills | Status: DC

## 2017-06-23 MED ORDER — LEVALBUTEROL HCL 0.63 MG/3ML IN NEBU
0.6300 mg | INHALATION_SOLUTION | Freq: Once | RESPIRATORY_TRACT | Status: AC
  Administered 2017-06-23: 0.63 mg via RESPIRATORY_TRACT

## 2017-06-23 MED ORDER — METHYLPREDNISOLONE ACETATE 80 MG/ML IJ SUSP
80.0000 mg | Freq: Once | INTRAMUSCULAR | Status: AC
  Administered 2017-06-23: 80 mg via INTRAMUSCULAR

## 2017-06-23 NOTE — Assessment & Plan Note (Signed)
Nonspecific recent exacerbation might be weather related without obvious purulent change or fever. Plan-nebulizer Xopenex, Depo-Medrol, Augmentin, CXR, continue using pneumatic vest

## 2017-06-23 NOTE — Progress Notes (Signed)
HPI male former smoker followed for chronic bronchitis/COPD, insomnia,, complicated  by HBP, ASCVD, chronic gastritis, GERD a1AT 06/20/13- MM  132   WNL PFT 04/12/08-FVC 3.57/82%, FEV1 2.36/80%, ratio or 0.66, FEF 25-75% 1.22/46%, TLC 67%, DLCO 95% CT chest 06/27/2015-Mild cylindrical bronchiectasis throughout the lung bases  ----------------------------------------------------------------------------------------------------------------------- 06/23/2016-82 year old male [place] former smoker followed for chronic bronchitis/COPD, insomnia, complicated by HBP, chronic gastritis, GERD, atherosclerosis, prostate cancer FOLLOWS FOR: Pt states he has SOB, chest tightness that comes and goes, cough with white mucus and has to keep a spit cup with him. Denies fever. Pneumatic vest is not enough help and seems to irritate his airway. Flutter device does not help. He paces his activity. CT chest 06/27/2015 IMPRESSION: 1. Mild cylindrical bronchiectasis throughout the lung bases bilaterally with what appears to be chronic post infectious or inflammatory scarring. 2. Atherosclerosis, including left main and 2 vessel coronary artery disease. Assessment for potential risk factor modification, dietary therapy or pharmacologic therapy may be warranted, if clinically indicated. 3. Sequela of old granulomatous disease, as above.  06/23/17- 82 year old male [place] former smoker followed for chronic bronchitis/bronchiectasis COPD, insomnia, complicated by HBP, chronic gastritis, GERD, atherosclerosis, prostate cancer ----COPD: bronchitis Difficulty year because of heat and humid weather which make his cough worse. Distinct exacerbation with increased cough but no fever over the last 1-2 weeks. He is using his pneumatic vest twice daily which helps some. Inhalers don't help. Gets meds through the [place] including both S/P and albuterol HFA.  ROS-See HPI  + = positive Constitutional:   No-   weight loss, night  sweats, fevers, chills, fatigue, +lassitude. HEENT:   No-  headaches, difficulty swallowing, tooth/dental problems, sore throat,       No-  sneezing, itching, ear ache,     no current- nasal congestion, post nasal drip,  CV:  No-chest pain, orthopnea, PND, swelling in lower extremities, anasarca, dizziness, palpitations Resp: + shortness of breath with exertion or at rest.              +Persistent  productive cough,  No non-productive cough,  No- coughing up of blood.              No-change in color of mucus.  No- wheezing.   Skin: No-   rash or lesions. GI: + heartburn, indigestion,  No-abdominal pain, nausea, vomiting,  GU:  MS:  No-   joint pain or swelling.    + back pain. Neuro-     nothing unusual Psych:  No- change in mood or affect. No depression or anxiety.  No memory loss.  OBJ General- Alert, Oriented, Affect-appropriate, Distress- none acute; overweight Skin- rash-none, lesions- none, excoriation- none Lymphadenopathy- none Head- atraumatic            Eyes- Gross vision intact, PERRLA, conjunctivae clear secretions            Ears- Hearing, canals-normal            Nose- Clear, no-Septal dev, mucus, polyps, erosion, perforation             Throat- Mallampati III , mucosa cobblestoned , drainage- none, tonsils- atrophic Neck- flexible , trachea midline, no stridor , thyroid nl, carotid no bruit Chest - symmetrical excursion , unlabored           Heart/CV- RRR , no murmur , no gallop  , no rub, nl s1 s2                           -  JVD- none , edema- none, stasis changes- none, varices- none           Lung-+ coarse bilateral rhonchi,+ slow exhalation, Unlabored , dullness-none, rub- none, + wet rattling laugh           Chest wall-  Abd-  Br/ Gen/ Rectal- Not done, not indicated Extrem- cyanosis- none, clubbing, none, atrophy- none, strength- nl Neuro- grossly intact to observation

## 2017-06-23 NOTE — Patient Instructions (Signed)
[facility]in [facility]  Neb xop 0.63    Dx exacerbation bronchiectasis         Depo 90  Order CXR    Dx excerbation of bronchiectasis

## 2017-06-24 ENCOUNTER — Telehealth: Payer: Self-pay | Admitting: Internal Medicine

## 2017-06-24 NOTE — Telephone Encounter (Signed)
  Pt is aware of results and voiced his understanding. Nothing further is needed.     Notes recorded by Dolores Lory, RN on 06/24/2017 at 4:42 PM EST Left message for patient to call back and placed order for x-ray. ------  Notes recorded by Baird Lyons D, MD on 06/24/2017 at 3:55 PM EST CXR- There appears to be some pneumonia in the lower right lung. Take the antibiotic as planned. Call us after finishing it to let us know how he is doing.  Please order f/u CXR chest future 3 weeks   Dx community pneumonia, bronchiectasis.

## 2017-07-01 ENCOUNTER — Telehealth: Payer: Self-pay | Admitting: Internal Medicine

## 2017-07-01 NOTE — Telephone Encounter (Signed)
Patient called back. He stated that he was told to call Katie and let her know how he was feeling after finished his antibiotic. He stated that he is feeling a lot better but still has the cough and congestion. Stated that his cough was not productive.   Will route to [place].

## 2017-07-01 NOTE — Telephone Encounter (Signed)
If he would like to refill and repeat is antibiotic that would be ok- I think it was augmentin

## 2017-07-01 NOTE — Telephone Encounter (Signed)
Left message for patient to call back  

## 2017-07-05 NOTE — Telephone Encounter (Signed)
Spoke with pt. He states that he is feeling better and does not want a refill on Augmentin. Nothing further was needed.

## 2017-07-15 ENCOUNTER — Ambulatory Visit (INDEPENDENT_AMBULATORY_CARE_PROVIDER_SITE_OTHER)
Admission: RE | Admit: 2017-07-15 | Discharge: 2017-07-15 | Disposition: A | Discharge status: Home / Self Care | Payer: Medicare HMO | Source: Ambulatory Visit | Attending: Internal Medicine | Admitting: Internal Medicine

## 2017-07-15 DIAGNOSIS — J189 Pneumonia, unspecified organism: Secondary | ICD-10-CM | POA: Diagnosis not present

## 2017-07-19 ENCOUNTER — Telehealth: Payer: Self-pay | Admitting: Internal Medicine

## 2017-07-19 NOTE — Telephone Encounter (Signed)
Notes recorded by Deneise Lever, MD on 07/16/2017 at 6:13 PM EST CXR- stable changes of chronic bronchitis. No pneumonia seen       ATC pt, no answer. Left message for pt to call back.

## 2017-07-20 NOTE — Telephone Encounter (Signed)
Patient is returning phone call regarding results. 

## 2017-07-20 NOTE — Telephone Encounter (Signed)
Returned pt's call and gave him the results of cxr he had done.  Pt expressed understanding. Nothing further needed at this current time.

## 2017-08-23 DIAGNOSIS — E559 Vitamin D deficiency, unspecified: Secondary | ICD-10-CM | POA: Insufficient documentation

## 2017-10-21 ENCOUNTER — Ambulatory Visit: Payer: Medicare HMO | Admitting: Internal Medicine

## 2017-10-21 ENCOUNTER — Encounter: Payer: Self-pay | Admitting: Internal Medicine

## 2017-10-21 VITALS — BP 124/82 | HR 73 | Ht 70.0 in | Wt 197.4 lb

## 2017-10-21 DIAGNOSIS — J471 Bronchiectasis with (acute) exacerbation: Secondary | ICD-10-CM

## 2017-10-21 DIAGNOSIS — J479 Bronchiectasis, uncomplicated: Secondary | ICD-10-CM | POA: Diagnosis not present

## 2017-10-21 DIAGNOSIS — J449 Chronic obstructive pulmonary disease, unspecified: Secondary | ICD-10-CM

## 2017-10-21 NOTE — Progress Notes (Signed)
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  Walked 3 laps = 555 feet. -----------------------------------------------------------------------------------------------------------------------  06/23/17- 82 year old male [place] former smoker followed for chronic bronchitis/bronchiectasis COPD, insomnia, complicated by HBP, chronic gastritis, GERD, atherosclerosis, prostate cancer ----COPD: bronchitis Difficulty year because of heat and humid weather which make his cough worse. Distinct exacerbation with increased cough but no fever over the last 1-2 weeks. He is using his pneumatic vest twice daily which helps some. Inhalers don't help. Gets meds through the [place] including both S/P and albuterol HFA.  67/ 60/38- 82 year old male [place] former smoker followed for chronic bronchitis/bronchiectasis COPD, insomnia, complicated by HBP, chronic gastritis, GERD, atherosclerosis, prostate cancer ----Doing okay still has a dry cough ,wheezing. Symbicort 160, Bevespi, albuterol HFA, He has been stable for quite some time.  Never has had much response to bronchodilators.  Arthritis limits his walking and he has gained some weight.  Aware of shortness of breath if he sits or lies down associated with increased abdominal obesity. Walk test 10/21/2017-oxygen saturation nadir 90%, heart rate maximum 126.  Walked 3 laps = 555 feet. CXR 07/15/2017 IMPRESSION: 1. No change in bibasilar linear scarring and atelectasis. 2. No definite active process.  ROS-See HPI  + = positive Constitutional:   No-   weight  loss, night sweats, fevers, chills, fatigue, +lassitude. HEENT:   No-  headaches, difficulty swallowing, tooth/dental problems, sore throat,       No-  sneezing, itching, ear ache,     no current- nasal congestion, post nasal drip,  CV:  No-chest pain, orthopnea, PND, swelling in lower extremities, anasarca, dizziness, palpitations Resp: + shortness of breath with exertion or at rest.                productive cough,  + non-productive cough,  No- coughing up of blood.              No-change in color of mucus.  No- wheezing.   Skin: No-   rash or lesions. GI: + heartburn, indigestion,  No-abdominal pain, nausea, vomiting,  GU:  MS:  No-   joint pain or swelling.    + back pain. Neuro-     nothing unusual Psych:  No- change in mood or affect. No depression or anxiety.  No memory loss.  OBJ General- Alert, Oriented, Affect-appropriate, Distress- none acute; +overweight Skin- rash-none, lesions- none, excoriation- none Lymphadenopathy- none Head- atraumatic            Eyes- Gross vision intact, PERRLA, conjunctivae clear secretions            Ears- Hearing, canals-normal            Nose- Clear, no-Septal dev, mucus, polyps, erosion, perforation             Throat- Mallampati III , mucosa cobblestoned , drainage- none, tonsils- atrophic Neck- flexible , trachea midline, no stridor , thyroid nl, carotid no bruit Chest - symmetrical excursion , unlabored           Heart/CV- RRR , no murmur , no gallop  , no rub, nl s1 s2                           -  JVD- none , edema- none, stasis changes- none, varices- none           Lung-+ coarse bilateral rhonchi,+ slow exhalation, Unlabored , dullness-none, rub- none,            Chest wall-  Abd-  Br/ Gen/ Rectal- Not done, not indicated Extrem- cyanosis- none, clubbing, none, atrophy- none, strength- nl Neuro- grossly intact to observation

## 2017-10-21 NOTE — Assessment & Plan Note (Addendum)
Bronchiectasis confirmed on CT.  Currently near his baseline.  Airways are clear and cough is dry.  He did not desaturate today on walk test. Plan-overnight oximetry on room air

## 2017-10-21 NOTE — Patient Instructions (Signed)
Order- schedule overnight oximetry    Dx bronchiectasis without exacerbation  Order- Walk test O2 qualifying  Ok to continue current meds

## 2017-12-01 ENCOUNTER — Encounter: Payer: Self-pay | Admitting: Internal Medicine

## 2017-12-29 ENCOUNTER — Emergency Department (HOSPITAL_COMMUNITY): Payer: Medicare HMO

## 2017-12-29 ENCOUNTER — Encounter (HOSPITAL_COMMUNITY): Payer: Self-pay | Admitting: Emergency Medicine

## 2017-12-29 ENCOUNTER — Other Ambulatory Visit: Payer: Self-pay

## 2017-12-29 ENCOUNTER — Emergency Department (HOSPITAL_COMMUNITY)
Admission: EM | Admit: 2017-12-29 | Discharge: 2017-12-29 | Disposition: A | Discharge status: Home / Self Care | Payer: Medicare HMO | Attending: Emergency Medicine | Admitting: Emergency Medicine

## 2017-12-29 DIAGNOSIS — Z8546 Personal history of malignant neoplasm of prostate: Secondary | ICD-10-CM | POA: Diagnosis not present

## 2017-12-29 DIAGNOSIS — I10 Essential (primary) hypertension: Secondary | ICD-10-CM | POA: Diagnosis not present

## 2017-12-29 DIAGNOSIS — Z85828 Personal history of other malignant neoplasm of skin: Secondary | ICD-10-CM | POA: Insufficient documentation

## 2017-12-29 DIAGNOSIS — M47816 Spondylosis without myelopathy or radiculopathy, lumbar region: Secondary | ICD-10-CM | POA: Insufficient documentation

## 2017-12-29 DIAGNOSIS — R600 Localized edema: Secondary | ICD-10-CM | POA: Diagnosis not present

## 2017-12-29 DIAGNOSIS — Z7982 Long term (current) use of aspirin: Secondary | ICD-10-CM | POA: Insufficient documentation

## 2017-12-29 DIAGNOSIS — Z79899 Other long term (current) drug therapy: Secondary | ICD-10-CM | POA: Insufficient documentation

## 2017-12-29 DIAGNOSIS — J449 Chronic obstructive pulmonary disease, unspecified: Secondary | ICD-10-CM | POA: Insufficient documentation

## 2017-12-29 DIAGNOSIS — Z87891 Personal history of nicotine dependence: Secondary | ICD-10-CM | POA: Diagnosis not present

## 2017-12-29 DIAGNOSIS — M545 Low back pain, unspecified: Secondary | ICD-10-CM

## 2017-12-29 MED ORDER — HYDROCODONE-ACETAMINOPHEN 5-325 MG PO TABS
1.0000 | ORAL_TABLET | Freq: Once | ORAL | Status: AC
  Administered 2017-12-29: 1 via ORAL
  Filled 2017-12-29: qty 1

## 2017-12-29 MED ORDER — HYDROCODONE-ACETAMINOPHEN 5-325 MG PO TABS: 1.0000 | ORAL_TABLET | ORAL | 0 refills | Status: DC | PRN

## 2017-12-29 NOTE — ED Triage Notes (Signed)
Low Back pain for 2 weeks, radiating into left groin at times. Pt split wood and flipped over on a scooter chair. Not sure if this caused his problem. Pain did not happen immediately.

## 2017-12-29 NOTE — Discharge Instructions (Addendum)
Start taking the meloxicam (mobic) that was prescribed by your doctor last week.  You may add the medicine prescribed, however,use caution with hydrocodone.  Do not drive or perform any potentially dangerous activities within 4 hours of taking this medication as this will make you drowsy.  Avoid lifting,  Bending,  Twisting or any other activity that worsens your pain over the next week.  Apply a heating pad to your back for 20 minutes 3-4 times daily.   You should get rechecked if your symptoms are not better over the next 5 days,  Or you develop increased pain,  Weakness in your leg(s) or loss of bladder or bowel function - these are symptoms of a worsening condition.  There is no sign of injury(compression fractures) or tumors in your lumbar spine but you do have arthritis in your spine.

## 2017-12-29 NOTE — ED Notes (Signed)
Pt to xray

## 2017-12-29 NOTE — ED Provider Notes (Signed)
[facility] EMERGENCY DEPARTMENT Provider Note   CSN: [id number] Arrival date & time: 12/29/17  7989     History   Chief Complaint Chief Complaint  Patient presents with  . Back Pain    HPI [name] is a 82 y.o. male with a history of COPD, bronchiectasis, GERD and hypertension, also history of prostate cancer, osteoporosis and chronic low back pain that was previously followed by pain management in [place] presenting with a 2-week history of worsening left lower back pain without radiation. He reports an incident of falling off a scooter but this occurred one month ago, pain sx did not worsen until about 2 weeks later.  He was seen by his PCP last week at which time he prescribed but did not yet start Mobic. There has been no weakness or numbness in the lower extremities and no urinary or bowel retention or new incontinence but describes chronic urinary incontinence since his prostate cryosurgery procedure in 2015.    The history is provided by the patient.    Past Medical History:  Diagnosis Date  . At risk for sleep apnea    STOP-BANG= 4     SENT TO PCP  12-26-2013  . Bronchiectasis without complication ([place])   . COPD (chronic obstructive pulmonary disease) with chronic bronchitis ([place])    pulmologist-  dr Tarri Fuller young  . GERD (gastroesophageal reflux disease)   . History of colon polyps    2007--  simple adenoma  . History of gastritis    SECONDARY TO ADVIL  . History of skin cancer    EXCISION LEFT SIDE OF FACE  . Hypertension   . Insomnia   . Nocturia   . Prostate cancer [facility])    dx  2009--  XRT    Patient Active Problem List   Diagnosis Date Noted  . Bronchiectasis with acute exacerbation ([place]) 07/01/2015  . Pain in thoracic spine 06/27/2012  . Low back pain 06/27/2012  . Acute rhinitis 04/11/2011  . Colon polyps 02/26/2011  . GASTRITIS, CHRONIC 03/20/2009  . EPIGASTRIC PAIN 03/20/2009  . COLONIC POLYPS, ADENOMATOUS, HX OF 03/20/2009  . GERD  (gastroesophageal reflux disease) 03/20/2009  . ABDOMINAL PAIN, HX OF 03/20/2009  . DYSPNEA 04/12/2008  . HYPERTENSION 01/13/2008  . COPD with chronic bronchitis/probable bronchiectasis 01/13/2008  . INSOMNIA 01/13/2008    Past Surgical History:  Procedure Laterality Date  . CATARACT EXTRACTION W/ INTRAOCULAR LENS  IMPLANT, [place]  . COLONOSCOPY  JUL 2007 D25 V1   Simple adenomas(RECTUM), HYPERPLASTIC COLON POLYPS, [place] [facility]  . COLONOSCOPY  03/18/2011   Procedure: COLONOSCOPY;  Surgeon: Dorothyann Peng, MD;  Location: [facility];  Service: Endoscopy;  Laterality: N/A;  8:30  . CRYOABLATION N/A 12/29/2013   Procedure: CRYO ABLATION PROSTATE;  Surgeon: Arvil Persons, MD;  Location[facility];  Service: Urology;  Laterality: N/A;  . UPPER GASTROINTESTINAL ENDOSCOPY  2007 DYSPEPSIA D50 V4   NSAID GASTRITIS        Home Medications    Prior to Admission medications   Medication Sig Start Date End Date Taking? Authorizing Provider  albuterol (PROVENTIL HFA;VENTOLIN HFA) 108 (90 BASE) MCG/ACT inhaler Inhale 2 puffs into the lungs every 6 (six) hours as needed.      [provider]  Alendronate Sodium (BINOSTO) 70 MG TBEF Take 70 mg by mouth once a week.    [provider]  aspirin 81 MG tablet Take 81 mg by  mouth daily.      [provider]  budesonide-formoterol (SYMBICORT) 160-4.5 MCG/ACT inhaler Inhale 2 puffs into the lungs 2 (two) times daily.    [provider]  clonazePAM (KLONOPIN) 2 MG tablet Take 1 mg by mouth at bedtime as needed.    [provider]  doxazosin (CARDURA) 4 MG tablet Take 4 mg by mouth at bedtime.  01/29/11   [provider]  ergocalciferol (VITAMIN D2) 50000 UNITS capsule Take 50,000 Units by mouth once a week.      [provider]  Glycopyrrolate-Formoterol (BEVESPI AEROSPHERE) 9-4.8 MCG/ACT AERO Inhale 2 puffs into the lungs 2 (two) times daily. 06/23/16   Baird Lyons  D, MD  hydrochlorothiazide 25 MG tablet Take 25 mg by mouth every morning.     [provider]  HYDROcodone-acetaminophen (NORCO/VICODIN) 5-325 MG tablet Take 1 tablet by mouth every 4 (four) hours as needed. 12/29/17   Evalee Jefferson, PA-C  leuprolide (LUPRON) 22.5 MG injection Inject 22.5 mg into the muscle every 3 (three) months.    [provider]  losartan (COZAAR) 100 MG tablet Take 100 mg by mouth every morning.     [provider]  MAGNESIUM PO Take 500 mg by mouth daily.    [provider]  Multiple Vitamin (MULTIVITAMIN) capsule Take 1 capsule by mouth daily.      [provider]  omeprazole (PRILOSEC) 20 MG capsule Take 20 mg by mouth every morning.     [provider]  Red Yeast Rice 600 MG CAPS Take 1 capsule by mouth every morning.     [provider]    Family History Family History  Problem Relation Age of Onset  . Prostate cancer Father   . Rectal cancer Sister   . Colon cancer Neg Hx   . Colon polyps Neg Hx     Social History Social History   Tobacco Use  . Smoking status: Former Smoker    Packs/day: 1.50    Years: 30.00    Pack years: 45.00    Types: Cigarettes    Last attempt to quit: 06/23/1979    Years since quitting: 38.5  . Smokeless tobacco: Never Used  Substance Use Topics  . Alcohol use: Yes    Comment: occ  . Drug use: No     Allergies   Guaifenesin er and Roflumilast   Review of Systems Review of Systems  Constitutional: Negative for fever.  Respiratory: Negative for shortness of breath.   Cardiovascular: Negative for chest pain and leg swelling.  Gastrointestinal: Negative for abdominal distention, abdominal pain and constipation.  Genitourinary: Negative for difficulty urinating, dysuria, flank pain, frequency and urgency.  Musculoskeletal: Positive for back pain. Negative for gait problem and joint swelling.  Skin: Negative for rash.  Neurological: Negative for weakness and  numbness.     Physical Exam Updated Vital Signs BP (!) 163/64 (BP Location: Left Arm)   Pulse 73   Temp 97.9 F (36.6 C) (Oral)   Resp 18   Ht 5' 10.5" (1.791 m)   Wt 87.5 kg (193 lb)   SpO2 94%   BMI 27.30 kg/m   Physical Exam  Constitutional: He appears well-developed and well-nourished.  HENT:  Head: Normocephalic.  Eyes: Conjunctivae are normal.  Neck: Normal range of motion. Neck supple.  Cardiovascular: Normal rate and intact distal pulses.  Pedal pulses normal.  Pulmonary/Chest: Effort normal.  Abdominal: Soft. Bowel sounds are normal. He exhibits no distension and no mass.  There is no guarding.  Musculoskeletal: Normal range of motion. He exhibits edema.       Lumbar back: He exhibits tenderness. He exhibits no swelling, no edema and no spasm.  Moderate ankle edema, right greater than left.   Neurological: He is alert. He has normal strength. He displays no atrophy and no tremor. No sensory deficit. Gait normal.  Reflex Scores:      Patellar reflexes are 2+ on the right side and 2+ on the left side.      Achilles reflexes are 2+ on the right side and 2+ on the left side. No strength deficit noted in hip and knee flexor and extensor muscle groups.  Ankle flexion and extension intact.  Skin: Skin is warm and dry.  Psychiatric: He has a normal mood and affect.  Nursing note and vitals reviewed.    ED Treatments / Results  Labs (all labs ordered are listed, but only abnormal results are displayed) Labs Reviewed - No data to display  EKG None  Radiology Dg Lumbar Spine Complete  Result Date: 12/29/2017 CLINICAL DATA:  Low back pain EXAM: LUMBAR SPINE - COMPLETE 4+ VIEW COMPARISON:  None. FINDINGS: There are 5 nonrib bearing lumbar-type vertebral bodies. The vertebral body heights are maintained. There is generalized osteopenia. There is minimal grade 1 anterolisthesis of L4 on L5 secondary to facet disease. There is no spondylolysis. There is no acute fracture.  There is degenerative disc disease with disc height loss throughout the lumbar spine. There is bilateral facet arthropathy throughout the lumbar spine most severe at L4-5 and L5-S1. Mild osteoarthritis of the right sacroiliac joint. There is abdominal aortic atherosclerosis. IMPRESSION: 1.  No acute osseous injury of the lumbar spine. 2. Diffuse lumbar spine spondylosis. 3.  Aortic Atherosclerosis (ICD10-I70.0). Electronically Signed   By: Kathreen Devoid   On: 12/29/2017 10:23    Procedures Procedures (including critical care time)  Medications Ordered in ED Medications  HYDROcodone-acetaminophen (NORCO/VICODIN) 5-325 MG per tablet 1 tablet (1 tablet Oral Given 12/29/17 1045)     Initial Impression / Assessment and Plan / ED Course  I have reviewed the triage vital signs and the nursing notes.  Pertinent labs & imaging results that were available during my care of the patient were reviewed by me and considered in my medical decision making (see chart for details).     No neuro deficit on exam or by history to suggest emergent or surgical presentation.  Also discussed worsened sx that should prompt immediate re-evaluation including distal weakness, bowel/bladder retention/incontinence. Pt to start his meloxicam, added hydrocodone, caution given regarding sedation with his medication.  Advised follow-up with his PCP for recheck in 1 week if symptoms are not improving.  Discussed home treatments including heat therapy.  Patient has a history of osteoporosis but there is no evidence of compression fractures based on today's imaging.  No abdominal pain, abdomen is soft, no signs of AAA.  Pt discussed with Dr. Wilson Singer prior to dc home.    Final Clinical Impressions(s) / ED Diagnoses   Final diagnoses:  Acute left-sided low back pain without sciatica  Spondylosis of lumbar region without myelopathy or radiculopathy    ED Discharge Orders        Ordered    HYDROcodone-acetaminophen  (NORCO/VICODIN) 5-325 MG tablet  Every 4 hours PRN     12/29/17 1048       Evalee Jefferson, PA-C 12/29/17 1056    Virgel Manifold, MD 12/31/17 0725

## 2018-01-10 ENCOUNTER — Telehealth: Payer: Self-pay | Admitting: Internal Medicine

## 2018-01-10 NOTE — Telephone Encounter (Signed)
Patient called and states that he made a mistake - it was another dr that called him - pr

## 2018-02-22 ENCOUNTER — Ambulatory Visit: Payer: Medicare HMO | Admitting: Internal Medicine

## 2018-02-23 ENCOUNTER — Encounter: Payer: Self-pay | Admitting: Internal Medicine

## 2018-02-23 ENCOUNTER — Ambulatory Visit: Payer: Medicare HMO | Admitting: Internal Medicine

## 2018-02-23 VITALS — BP 122/70 | HR 82 | Ht 70.0 in | Wt 196.0 lb

## 2018-02-23 DIAGNOSIS — J471 Bronchiectasis with (acute) exacerbation: Secondary | ICD-10-CM | POA: Diagnosis not present

## 2018-02-23 DIAGNOSIS — J449 Chronic obstructive pulmonary disease, unspecified: Secondary | ICD-10-CM

## 2018-02-23 MED ORDER — TIOTROPIUM BROMIDE MONOHYDRATE 18 MCG IN CAPS: 18.0000 ug | ORAL_CAPSULE | Freq: Every day | RESPIRATORY_TRACT | 12 refills | Status: DC

## 2018-02-23 NOTE — Assessment & Plan Note (Signed)
His pneumatic vest does help with secretion clearance.  He is stable and recognizes his limitations.  I do not have anything additional to offer now.

## 2018-02-23 NOTE — Progress Notes (Signed)
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  Walked 3 laps = 555 feet. -----------------------------------------------------------------------------------------------------------------------  40/ 28/59- 82 year old male [place] former smoker followed for chronic bronchitis/bronchiectasis COPD, insomnia, complicated by HBP, chronic gastritis, GERD, atherosclerosis, prostate cancer ----Doing okay still has a dry cough ,wheezing. Symbicort 160, Bevespi, albuterol HFA, He has been stable for quite some time.  Never has had much response to bronchodilators.  Arthritis limits his walking and he has gained some weight.  Aware of shortness of breath if he sits or lies down associated with increased abdominal obesity. Walk test 10/21/2017-oxygen saturation nadir 90%, heart rate maximum 126.  Walked 3 laps = 555 feet. CXR 07/15/2017 IMPRESSION: 1. No change in bibasilar linear scarring and atelectasis. 2. No definite active process.  02/23/18- 82 year old male [place] former smoker followed for chronic bronchitis/bronchiectasis COPD, insomnia, complicated by HBP, chronic gastritis, GERD, atherosclerosis, prostate cancer -----Bronchiectasis: Pt states he continues to have SOB and wheezing/cough with exertion.  Albuterol HFA, Symbicort 160, Spiriva, VEST He uses his pneumatic vest at intervals when needed and it helps.  Cough is most productive when he is been lying down at night.  Tends to cough with position changes, leaning back in recliner or lying down.  Sputum can be dark but not bloody or  purulent. DOE is stable with no acute events. He is considering urologic surgery and asked about anesthesia.  I think he would tolerate necessary surgery, understanding pulmonary consultation will be available if needed.  A spinal block might be a good choice if it is an option.  ROS-See HPI  + = positive Constitutional:   No-   weight loss, night sweats, fevers, chills, fatigue, +lassitude. HEENT:   No-  headaches, difficulty swallowing, tooth/dental problems, sore throat,       No-  sneezing, itching, ear ache,     no current- nasal congestion, post nasal drip,  CV:  No-chest pain, orthopnea, PND, swelling in lower extremities, anasarca, dizziness, palpitations Resp: + shortness of breath with exertion or at rest.                productive cough,  + non-productive cough,  No- coughing up of blood.              No-change in color of mucus.  No- wheezing.   Skin: No-   rash or lesions. GI: + heartburn, indigestion,  No-abdominal pain, nausea, vomiting,  GU:  MS:  No-   joint pain or swelling.    + back pain. Neuro-     nothing unusual Psych:  No- change in mood or affect. No depression or anxiety.  No memory loss.  OBJ General- Alert, Oriented, Affect-appropriate, Distress- none acute; +overweight Skin- rash-none, lesions- none, excoriation- none Lymphadenopathy- none Head- atraumatic            Eyes- Gross vision intact, PERRLA, conjunctivae clear secretions            Ears- Hearing, canals-normal            Nose- Clear, no-Septal dev, mucus, polyps, erosion, perforation             Throat- Mallampati III , mucosa cobblestoned ,  drainage- none, tonsils- atrophic Neck- flexible , trachea midline, no stridor , thyroid nl, carotid no bruit Chest - symmetrical excursion , unlabored           Heart/CV- RRR , no murmur , no gallop  , no rub, nl s1 s2                           - JVD- none , edema- none, stasis changes- none, varices- none           Lung-+ faint bilateral rhonchi,+ slow  exhalation, Unlabored , dullness-none, rub- none,            Chest wall-  Abd-  Br/ Gen/ Rectal- Not done, not indicated Extrem- cyanosis- none, clubbing, none, atrophy- none, strength- nl Neuro- grossly intact to observation

## 2018-02-23 NOTE — Assessment & Plan Note (Addendum)
Clinically stable with moderate bronchiectasis/chronic bronchitis.  No acute process. Continue present meds. He wants to wait until later in the fall for his flu shot.

## 2018-02-23 NOTE — Patient Instructions (Signed)
We can continue with current meds  I think you can talk with your Urologist about necessary surgery, perhaps with a spinal block.  Please call if we can help.

## 2018-04-01 ENCOUNTER — Encounter: Payer: Self-pay | Admitting: Internal Medicine

## 2018-04-01 ENCOUNTER — Telehealth: Payer: Self-pay | Admitting: Internal Medicine

## 2018-04-01 NOTE — Telephone Encounter (Signed)
Received faxed and placed on Katie's desk for Kerr to review  Thanks

## 2018-04-01 NOTE — Telephone Encounter (Signed)
Nichol returned call.  Advised form has not been received so she states she will re-fax this over to our office now.

## 2018-04-01 NOTE — Telephone Encounter (Signed)
lmtcb x1 for [place] with [facility].  I have checked CY's look at and folder up front, it does not appear that clearance form has been received.

## 2018-04-04 NOTE — Telephone Encounter (Signed)
This letter has been sent

## 2018-04-05 NOTE — Telephone Encounter (Signed)
Attempted to call Elmyra Ricks at [facility]. I did not receive an answer. Will try back.

## 2018-04-06 NOTE — Telephone Encounter (Signed)
Attempted to call Elmyra Ricks at [facility]. I did not receive an answer. Will try back.

## 2018-04-07 NOTE — Telephone Encounter (Signed)
Spoke with [name] from [facility]. States that she is did not receive the letter for this pt. I have refaxed this to her. Nothing further was needed.

## 2018-04-08 ENCOUNTER — Other Ambulatory Visit: Payer: Self-pay | Admitting: Urology

## 2018-04-14 ENCOUNTER — Encounter: Payer: Self-pay | Admitting: Internal Medicine

## 2018-04-14 ENCOUNTER — Ambulatory Visit: Payer: Medicare HMO | Admitting: Internal Medicine

## 2018-04-14 ENCOUNTER — Ambulatory Visit (INDEPENDENT_AMBULATORY_CARE_PROVIDER_SITE_OTHER)
Admission: RE | Admit: 2018-04-14 | Discharge: 2018-04-14 | Disposition: A | Discharge status: Home / Self Care | Payer: Medicare HMO | Source: Ambulatory Visit | Attending: Internal Medicine | Admitting: Internal Medicine

## 2018-04-14 VITALS — BP 142/80 | HR 82 | Temp 97.3°F | Ht 70.0 in | Wt 192.6 lb

## 2018-04-14 DIAGNOSIS — J449 Chronic obstructive pulmonary disease, unspecified: Secondary | ICD-10-CM

## 2018-04-14 DIAGNOSIS — K219 Gastro-esophageal reflux disease without esophagitis: Secondary | ICD-10-CM

## 2018-04-14 NOTE — Patient Instructions (Signed)
Order- office spirometry    Dx bronchiectasis exacerbation, COPD mixed type  Order- CXR      Keep on with your regular inhaled medicines  Please call if we can help  You can go ahead with urologic surgery as we have discussed.

## 2018-04-14 NOTE — Assessment & Plan Note (Addendum)
He has significant chronic bronchitis/bronchiectasis, back to be his baseline now after recent acute exacerbation responded to amoxicillin and prednisone. He understands his limitations and that his risks related to surgery and anesthesia are higher because of his lung disease, but not prohibitive.  I think he is at baseline, without other interventions now that would significantly improve his operative status.  I suggest he go ahead with necessary surgery before cold weather brings chance of further exacerbations.

## 2018-04-14 NOTE — Assessment & Plan Note (Signed)
Emphasis on maintaining reflux precautions to avoid any possibility of aspiration.

## 2018-04-14 NOTE — Progress Notes (Signed)
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  Walked 3 laps = 555 feet. ----------------------------------------------------------------------------------------------------------------------- 02/23/18- 82 year old male [place] former smoker followed for chronic bronchitis/bronchiectasis COPD, insomnia, complicated by HBP, chronic gastritis, GERD, atherosclerosis, prostate cancer -----Bronchiectasis: Pt states he continues to have SOB and wheezing/cough with exertion.  Albuterol HFA, Symbicort 160, Spiriva, VEST He uses his pneumatic vest at intervals when needed and it helps.  Cough is most productive when he is been lying down at night.  Tends to cough with position changes, leaning back in recliner or lying down.  Sputum can be dark but not bloody or purulent. DOE is stable with no acute events. He is considering urologic surgery and asked about anesthesia.  I think he would tolerate necessary surgery, understanding pulmonary consultation will be available if needed.  A spinal block might be a good choice if it is an option.  04/14/2018- 82 year old male [place] former smoker followed for chronic bronchitis/bronchiectasis COPD, insomnia, complicated by HBP, chronic gastritis, GERD, atherosclerosis, prostate cancer -----Follows for: COPD, Dr. Patsy [place] [facility] is performing the surgery, clearing scar tissue in the urethra Spiriva HandiHaler, Symbicort 160, albuterol HFA, And exacerbation of bronchitis was treated at urgent care 10/16 with amoxicillin, prednisone.  He feels he is  back to his baseline now.  Coughs productively during the night and is clearest in the mornings.  No fever.  Walks 1 mile per day if weather is good, limited as much by arthritis in his feet is by his breathing.  He expresses no acute concerns today.  Office Spirometry 04/14/2018-moderately severe obstructive airways disease, FVC 2.3/58%, FEV1 1.5/53%, ratio 0.66, FEF 25-75% 0.9/45%  ROS-See HPI  + = positive Constitutional:   No-   weight loss, night sweats, fevers, chills, fatigue, +lassitude. HEENT:   No-  headaches, difficulty swallowing, tooth/dental problems, sore throat,       No-  sneezing, itching, ear ache,     no current- nasal congestion, post nasal drip,  CV:  No-chest pain, orthopnea, PND, swelling in lower extremities, anasarca, dizziness, palpitations Resp: + shortness of breath with exertion or at rest.                +productive cough,  + non-productive cough,  No- coughing up of blood.              No-change in color of mucus.  No- wheezing.   Skin: No-   rash or lesions. GI: + heartburn, indigestion,  No-abdominal pain, nausea, vomiting,  GU:  MS:  No-   joint pain or swelling.    + back pain. Neuro-     nothing unusual Psych:  No- change in mood or affect. No depression or anxiety.  No memory loss.  OBJ General- Alert, Oriented, Affect-appropriate, Distress- none acute; +overweight Skin- rash-none, lesions- none, excoriation- none Lymphadenopathy- none Head- atraumatic            Eyes- Gross vision intact, PERRLA, conjunctivae clear secretions            Ears- Hearing, canals-normal            Nose- Clear, no-Septal dev, mucus, polyps, erosion, perforation  Throat- Mallampati III , mucosa cobblestoned , drainage- none, tonsils- atrophic Neck- flexible , trachea midline, no stridor , thyroid nl, carotid no bruit Chest - symmetrical excursion , unlabored           Heart/CV- RRR , no murmur , no gallop  , no rub, nl s1 s2                           - JVD-  none , edema- none, stasis changes- none, varices- none           Lung-+ faint bilateral rhonchi,+ slow exhalation, Unlabored , dullness-none, rub- none,            Chest wall-  Abd-  Br/ Gen/ Rectal- Not done, not indicated Extrem- cyanosis- none, clubbing, none, atrophy- none, strength- nl Neuro- grossly intact to observation

## 2018-04-15 ENCOUNTER — Telehealth: Payer: Self-pay | Admitting: Internal Medicine

## 2018-04-15 DIAGNOSIS — J449 Chronic obstructive pulmonary disease, unspecified: Secondary | ICD-10-CM

## 2018-04-15 NOTE — Telephone Encounter (Signed)
Patient is returning call.  Phone number is [phone].

## 2018-04-15 NOTE — Telephone Encounter (Signed)
Called and spoke with pt letting him know the results of the cxr and stated to him that CY wanted him to repeat it after the surgery.  Pt expressed understanding. I have placed the order for cxr to be done 1 month after surgery.  Nothing further needed.

## 2018-04-15 NOTE — Telephone Encounter (Signed)
Notes recorded by Lorane Gell, CMA on 04/15/2018 at 10:16 AM EDT LMTCB ------  Notes recorded by Deneise Lever, MD on 04/15/2018 at 9:00 AM EDT CXR- There is some change in the right lung that could be scarring or left over from the recent acute infection. I would like to look at it again after surgery.  Order- CXR future 1 month  Dx COPD mixed type  Attempted to call pt but unable to reach him. Left message for pt to return call.

## 2018-04-18 ENCOUNTER — Telehealth: Payer: Self-pay | Admitting: Internal Medicine

## 2018-04-18 NOTE — Telephone Encounter (Signed)
I have spoke with the patient at 2:50pm on 04/18/18 patient verbalized understanding and nothing further is needed.

## 2018-04-18 NOTE — Progress Notes (Signed)
ATC pt, no answer. Left message for pt to call back.  

## 2018-04-26 ENCOUNTER — Other Ambulatory Visit: Payer: Self-pay

## 2018-04-26 ENCOUNTER — Encounter (HOSPITAL_BASED_OUTPATIENT_CLINIC_OR_DEPARTMENT_OTHER): Payer: Self-pay | Admitting: *Deleted

## 2018-04-26 NOTE — Progress Notes (Signed)
Spoke with Arlando Npo after midnight, arrive 800 am 04-29-18 [facility] meds to take sip of water: omeprazole, albuterol inhaler prn and bring inhaler, symbicort, spiriva Chest xray 04-14-18 chart/epic Pulmonary clearance note dr young 04-14-18 chart/epic Driver friend jimmie burroughs will stay for surgery Needs ekg and I stat 4

## 2018-04-29 ENCOUNTER — Encounter (HOSPITAL_BASED_OUTPATIENT_CLINIC_OR_DEPARTMENT_OTHER)
Admission: RE | Disposition: A | Discharge status: Home / Self Care | Payer: Self-pay | Source: Ambulatory Visit | Attending: Urology

## 2018-04-29 ENCOUNTER — Ambulatory Visit (HOSPITAL_BASED_OUTPATIENT_CLINIC_OR_DEPARTMENT_OTHER): Payer: Medicare HMO | Admitting: Anesthesiology

## 2018-04-29 ENCOUNTER — Encounter (HOSPITAL_BASED_OUTPATIENT_CLINIC_OR_DEPARTMENT_OTHER): Payer: Self-pay | Admitting: *Deleted

## 2018-04-29 ENCOUNTER — Other Ambulatory Visit: Payer: Self-pay

## 2018-04-29 ENCOUNTER — Ambulatory Visit (HOSPITAL_BASED_OUTPATIENT_CLINIC_OR_DEPARTMENT_OTHER)
Admission: RE | Admit: 2018-04-29 | Discharge: 2018-04-29 | Disposition: A | Discharge status: Home / Self Care | Payer: Medicare HMO | Source: Ambulatory Visit | Attending: Urology | Admitting: Urology

## 2018-04-29 DIAGNOSIS — C61 Malignant neoplasm of prostate: Secondary | ICD-10-CM | POA: Diagnosis not present

## 2018-04-29 DIAGNOSIS — Z87891 Personal history of nicotine dependence: Secondary | ICD-10-CM | POA: Diagnosis not present

## 2018-04-29 DIAGNOSIS — Z8042 Family history of malignant neoplasm of prostate: Secondary | ICD-10-CM | POA: Diagnosis not present

## 2018-04-29 DIAGNOSIS — J449 Chronic obstructive pulmonary disease, unspecified: Secondary | ICD-10-CM | POA: Insufficient documentation

## 2018-04-29 DIAGNOSIS — Z85828 Personal history of other malignant neoplasm of skin: Secondary | ICD-10-CM | POA: Insufficient documentation

## 2018-04-29 DIAGNOSIS — Z7982 Long term (current) use of aspirin: Secondary | ICD-10-CM | POA: Diagnosis not present

## 2018-04-29 DIAGNOSIS — I10 Essential (primary) hypertension: Secondary | ICD-10-CM | POA: Insufficient documentation

## 2018-04-29 DIAGNOSIS — K219 Gastro-esophageal reflux disease without esophagitis: Secondary | ICD-10-CM | POA: Diagnosis not present

## 2018-04-29 DIAGNOSIS — Z79899 Other long term (current) drug therapy: Secondary | ICD-10-CM | POA: Insufficient documentation

## 2018-04-29 DIAGNOSIS — R350 Frequency of micturition: Secondary | ICD-10-CM | POA: Diagnosis not present

## 2018-04-29 DIAGNOSIS — Z923 Personal history of irradiation: Secondary | ICD-10-CM | POA: Diagnosis not present

## 2018-04-29 DIAGNOSIS — N471 Phimosis: Secondary | ICD-10-CM | POA: Insufficient documentation

## 2018-04-29 HISTORY — DX: Dyspnea, unspecified: R06.00

## 2018-04-29 HISTORY — DX: Unspecified osteoarthritis, unspecified site: M19.90

## 2018-04-29 HISTORY — PX: CYSTOSCOPY: SHX5120

## 2018-04-29 LAB — POCT I-STAT 4, (NA,K, GLUC, HGB,HCT)
Glucose, Bld: 92 mg/dL (ref 70–99)
HEMATOCRIT: 41 % (ref 39.0–52.0)
Hemoglobin: 13.9 g/dL (ref 13.0–17.0)
Potassium: 4 mmol/L (ref 3.5–5.1)
SODIUM: 143 mmol/L (ref 135–145)

## 2018-04-29 SURGERY — DORSAL SLIT, PREPUCE
Anesthesia: General | Site: Penis

## 2018-04-29 MED ORDER — PROPOFOL 10 MG/ML IV BOLUS
INTRAVENOUS | Status: DC | PRN
  Administered 2018-04-29 (×2): 100 mg via INTRAVENOUS

## 2018-04-29 MED ORDER — DEXAMETHASONE SODIUM PHOSPHATE 10 MG/ML IJ SOLN
INTRAMUSCULAR | Status: AC
  Filled 2018-04-29: qty 1

## 2018-04-29 MED ORDER — LIDOCAINE HCL (CARDIAC) PF 100 MG/5ML IV SOSY
PREFILLED_SYRINGE | INTRAVENOUS | Status: DC | PRN
  Administered 2018-04-29: 60 mg via INTRAVENOUS

## 2018-04-29 MED ORDER — ONDANSETRON HCL 4 MG/2ML IJ SOLN
INTRAMUSCULAR | Status: AC
  Filled 2018-04-29: qty 2

## 2018-04-29 MED ORDER — BACITRACIN-NEOMYCIN-POLYMYXIN OINTMENT TUBE
TOPICAL_OINTMENT | CUTANEOUS | Status: DC | PRN
  Administered 2018-04-29: 1 via TOPICAL

## 2018-04-29 MED ORDER — FENTANYL CITRATE (PF) 100 MCG/2ML IJ SOLN
INTRAMUSCULAR | Status: DC | PRN
  Administered 2018-04-29 (×2): 25 ug via INTRAVENOUS

## 2018-04-29 MED ORDER — LIDOCAINE HCL (PF) 1 % IJ SOLN
INTRAMUSCULAR | Status: DC | PRN
  Administered 2018-04-29: 5 mL

## 2018-04-29 MED ORDER — LACTATED RINGERS IV SOLN
INTRAVENOUS | Status: DC
  Administered 2018-04-29 (×2): via INTRAVENOUS
  Filled 2018-04-29: qty 1000

## 2018-04-29 MED ORDER — SODIUM CHLORIDE 0.9 % IR SOLN
Status: DC | PRN
  Administered 2018-04-29: 1000 mL

## 2018-04-29 MED ORDER — ACETAMINOPHEN 10 MG/ML IV SOLN
1000.0000 mg | Freq: Once | INTRAVENOUS | Status: DC | PRN
  Filled 2018-04-29: qty 100

## 2018-04-29 MED ORDER — ONDANSETRON HCL 4 MG/2ML IJ SOLN
INTRAMUSCULAR | Status: DC | PRN
  Administered 2018-04-29: 4 mg via INTRAVENOUS

## 2018-04-29 MED ORDER — CEFAZOLIN SODIUM-DEXTROSE 2-4 GM/100ML-% IV SOLN
INTRAVENOUS | Status: AC
  Filled 2018-04-29: qty 100

## 2018-04-29 MED ORDER — DEXAMETHASONE SODIUM PHOSPHATE 10 MG/ML IJ SOLN
INTRAMUSCULAR | Status: DC | PRN
  Administered 2018-04-29: 4 mg via INTRAVENOUS

## 2018-04-29 MED ORDER — FENTANYL CITRATE (PF) 100 MCG/2ML IJ SOLN
INTRAMUSCULAR | Status: AC
  Filled 2018-04-29: qty 2

## 2018-04-29 MED ORDER — CEFAZOLIN SODIUM-DEXTROSE 2-4 GM/100ML-% IV SOLN
2.0000 g | Freq: Once | INTRAVENOUS | Status: AC
  Administered 2018-04-29: 2 g via INTRAVENOUS
  Filled 2018-04-29: qty 100

## 2018-04-29 MED ORDER — PHENYLEPHRINE HCL 10 MG/ML IJ SOLN
INTRAMUSCULAR | Status: DC | PRN
  Administered 2018-04-29 (×3): 80 ug via INTRAVENOUS

## 2018-04-29 MED ORDER — BUPIVACAINE HCL (PF) 0.25 % IJ SOLN
INTRAMUSCULAR | Status: DC | PRN
  Administered 2018-04-29: 5 mL

## 2018-04-29 MED ORDER — LIDOCAINE 2% (20 MG/ML) 5 ML SYRINGE
INTRAMUSCULAR | Status: AC
  Filled 2018-04-29: qty 5

## 2018-04-29 MED ORDER — FENTANYL CITRATE (PF) 100 MCG/2ML IJ SOLN
25.0000 ug | INTRAMUSCULAR | Status: DC | PRN
  Filled 2018-04-29: qty 1

## 2018-04-29 MED ORDER — PROPOFOL 10 MG/ML IV BOLUS
INTRAVENOUS | Status: AC
  Filled 2018-04-29: qty 20

## 2018-04-29 SURGICAL SUPPLY — 17 items
BAG DRAIN URO-CYSTO SKYTR STRL (DRAIN) ×3 IMPLANT
CLOTH BEACON ORANGE TIMEOUT ST (SAFETY) ×3 IMPLANT
COVER SURGICAL LIGHT HANDLE (MISCELLANEOUS) ×3 IMPLANT
ELECT REM PT RETURN 9FT ADLT (ELECTROSURGICAL)
ELECTRODE REM PT RTRN 9FT ADLT (ELECTROSURGICAL) IMPLANT
GAUZE SPONGE 4X4 12PLY STRL (GAUZE/BANDAGES/DRESSINGS) ×3 IMPLANT
GLOVE BIO SURGEON STRL SZ7.5 (GLOVE) ×3 IMPLANT
GOWN STRL REUS W/ TWL XL LVL3 (GOWN DISPOSABLE) ×1 IMPLANT
GOWN STRL REUS W/TWL XL LVL3 (GOWN DISPOSABLE) ×2
KIT TURNOVER CYSTO (KITS) ×3 IMPLANT
MANIFOLD NEPTUNE II (INSTRUMENTS) IMPLANT
NEEDLE HYPO 22GX1.5 SAFETY (NEEDLE) IMPLANT
NS IRRIG 500ML POUR BTL (IV SOLUTION) IMPLANT
PACK CYSTO (CUSTOM PROCEDURE TRAY) ×3 IMPLANT
TUBE CONNECTING 12'X1/4 (SUCTIONS)
TUBE CONNECTING 12X1/4 (SUCTIONS) IMPLANT
WATER STERILE IRR 3000ML UROMA (IV SOLUTION) ×3 IMPLANT

## 2018-04-29 NOTE — Discharge Instructions (Signed)

## 2018-04-29 NOTE — Op Note (Signed)
Preoperative diagnosis: phimosis, frequency   Postoperative diagnosis: Same  Procedure: Dorsal slit, cystoscopy (flexible)  Surgeon: Junious Silk  Anesthesia: General with a penile block  Indication for procedure: 82 year old white male with severe phimosis, in fact he had trouble voiding because of ballooning of the foreskin and a weak stream.  He also had urinary frequency.  Findings: Tight phimosis pinpoint.  Normal foreskin once retracted.  Normal glans.  Normal meatus.  On cystoscopy the urethra appeared normal, prostate was short and nonobstructive.  Bladder mild trabeculation, but no stone or foreign body.  No urothelial lesions.  Description of procedure: After consent was obtained patient brought to the operating room.  After adequate anesthesia the penis was prepped and draped in the usual sterile fashion.  Patient was supine.  A 10 cc Marcaine/lidocaine without epinephrine dorsal penile block was placed.  A straight hemostat used to cross-clamp at 12:00 the foreskin and penile skin.  This was incised.  It was taken as far back to the coronal sulcus as possible.  The foreskin was scarred to the coronal sulcus but otherwise normal.  It was cleaned with Betadine and irrigated several times.  The skin looked healthy.  The dorsal incision was then run closed with 3-0 chromic suture.  The incision was all the way around to about 6:00.  This allowed glans to move in and out.  The flexible cystoscope was passed per urethra and the urethra and bladder inspected.  Retroflexed view showed a normal bladder and no prostate protrusion.  The scope was drained.  He was awakened and taken to recovery room in stable condition.  Complications: None  Blood loss: Minimal  Specimens: None  Drains: None  Disposition: Patient stable to PACU

## 2018-04-29 NOTE — Anesthesia Preprocedure Evaluation (Addendum)
Anesthesia Evaluation  Patient identified by MRN, date of birth, ID band Patient awake    Reviewed: Allergy & Precautions, NPO status , Patient's Chart, lab work & pertinent test results  Airway Mallampati: III  TM Distance: >3 FB Neck ROM: Limited    Dental no notable dental hx. (+) Teeth Intact, Dental Advisory Given   Pulmonary shortness of breath and with exertion, COPD,  COPD inhaler, former smoker,  Bronchitis Oct 2019, now resolved    + decreased breath sounds      Cardiovascular hypertension, Pt. on medications negative cardio ROS Normal cardiovascular exam Rhythm:Regular Rate:Normal     Neuro/Psych negative neurological ROS  negative psych ROS   GI/Hepatic Neg liver ROS, GERD  ,  Endo/Other  negative endocrine ROS  Renal/GU negative Renal ROS  negative genitourinary   Musculoskeletal  (+) Arthritis ,   Abdominal   Peds  Hematology negative hematology ROS (+)   Anesthesia Other Findings Phimosis, urgency H/o prostate cancer  Reproductive/Obstetrics                            Anesthesia Physical Anesthesia Plan  ASA: III  Anesthesia Plan: General   Post-op Pain Management:    Induction: Intravenous  PONV Risk Score and Plan: 2 and Ondansetron, Dexamethasone and Treatment may vary due to age or medical condition  Airway Management Planned: Oral ETT and LMA  Additional Equipment:   Intra-op Plan:   Post-operative Plan: Extubation in OR  Informed Consent: I have reviewed the patients History and Physical, chart, labs and discussed the procedure including the risks, benefits and alternatives for the proposed anesthesia with the patient or authorized representative who has indicated his/her understanding and acceptance.   Dental advisory given  Plan Discussed with: CRNA  Anesthesia Plan Comments:         Anesthesia Quick Evaluation

## 2018-04-29 NOTE — Anesthesia Procedure Notes (Signed)
Procedure Name: LMA Insertion Date/Time: 04/29/2018 10:37 AM Performed by: Jonna Munro, CRNA Pre-anesthesia Checklist: Patient identified, Emergency Drugs available, Suction available, Patient being monitored and Timeout performed Patient Re-evaluated:Patient Re-evaluated prior to induction Oxygen Delivery Method: Circle system utilized Preoxygenation: Pre-oxygenation with 100% oxygen Induction Type: IV induction LMA: LMA inserted LMA Size: 4.0 Number of attempts: 1 Placement Confirmation: positive ETCO2 and breath sounds checked- equal and bilateral Tube secured with: Tape Dental Injury: Teeth and Oropharynx as per pre-operative assessment

## 2018-04-29 NOTE — Anesthesia Postprocedure Evaluation (Signed)
Anesthesia Post Note  Patient: [name]  Procedure(s) Performed: DORSAL SLIT (N/A Penis) CYSTOSCOPY, FLEXIBLE (N/A Penis)     Patient location during evaluation: PACU Anesthesia Type: General Level of consciousness: awake and alert Pain management: pain level controlled Vital Signs Assessment: post-procedure vital signs reviewed and stable Respiratory status: spontaneous breathing, nonlabored ventilation, respiratory function stable and patient connected to nasal cannula oxygen Cardiovascular status: blood pressure returned to baseline and stable Postop Assessment: no apparent nausea or vomiting Anesthetic complications: no    Last Vitals:  Vitals:   04/29/18 1200 04/29/18 1250  BP: (!) 163/72 (!) 153/67  Pulse: 68 78  Resp: 13 12  Temp:  37.2 C  SpO2: 91% 93%    Last Pain:  Vitals:   04/29/18 1230  TempSrc:   PainSc: 0-No pain                 Easton Sivertson L Batoul Limes

## 2018-04-29 NOTE — Transfer of Care (Signed)
Immediate Anesthesia Transfer of Care Note  Patient: [name]  Procedure(s) Performed: DORSAL SLIT (N/A Penis) CYSTOSCOPY, FLEXIBLE (N/A Penis)  Patient Location: PACU  Anesthesia Type:General  Level of Consciousness: awake, alert  and oriented  Airway & Oxygen Therapy: Patient Spontanous Breathing and Patient connected to face mask oxygen  Post-op Assessment: Report given to RN and Post -op Vital signs reviewed and stable  Post vital signs: Reviewed and stable  Last Vitals:  Vitals Value Taken Time  BP    Temp    Pulse 84 04/29/2018 11:11 AM  Resp    SpO2 97 % 04/29/2018 11:11 AM  Vitals shown include unvalidated device data.  Last Pain:  Vitals:   04/29/18 0816  TempSrc:   PainSc: 0-No pain      Patients Stated Pain Goal: 10 (94/83/47 [id number])  Complications: No apparent anesthesia complications

## 2018-04-29 NOTE — H&P (Signed)
H&P  Chief Complaint: Phimosis, frequency  History of Present Illness: Mr. Forst is an 82 year old white male with a history of prostate cancer treated with radiation salvage cryotherapy.  He had a recurrent PSA and is on intermittent androgen deprivation.  Is also on doxazosin and Myrbetriq for frequency and complained of severe phimosis.  He failed topicals.  I sent tolterodine but it was expensive and he said he cut his fluid pills down and now only has nocturia x1.  I told him to make sure his doctor that prescribed the fluid pill knows that he reduce the dose.  We discussed that can affect his heart and lungs cause fluid buildup and make it more difficult to breathe.  As far as his breathing he is doing well without shortness of breath and breathing clear.  He has had no fever.  He does not feel like he needs further therapy for the frequency.  Past Medical History:  Diagnosis Date  . Arthritis    neck, back, feet and ankles, feet and ankles swell due to arthritis, prop feet up swelling goes down  . Bronchiectasis without complication ([place])    bronchitis oct 2019 got clearance for surgery  . COPD (chronic obstructive pulmonary disease) with chronic bronchitis ([place])    pulmologist-  dr Tarri Fuller young  . Dyspnea    with heavy exertion  . GERD (gastroesophageal reflux disease)   . History of colon polyps    2007--  simple adenoma  . History of gastritis    SECONDARY TO ADVIL  . History of skin cancer    EXCISION LEFT SIDE OF FACE and neck  . Hypertension   . Insomnia   . Nocturia   . Prostate cancer [facility])    dx  2009--  XRT takine lupron shots for last shot oct 2019   Past Surgical History:  Procedure Laterality Date  . CATARACT EXTRACTION W/ INTRAOCULAR LENS  IMPLANT, [place]  . COLONOSCOPY  JUL 2007 D25 V1   Simple adenomas(RECTUM), HYPERPLASTIC COLON POLYPS, [place] [facility]  . COLONOSCOPY  03/18/2011   Procedure: COLONOSCOPY;  Surgeon: Dorothyann Peng, MD;  [facility]  [facility];  Service: Endoscopy;  Laterality: N/A;  8:30  . CRYOABLATION N/A 12/29/2013   Procedure: CRYO ABLATION PROSTATE;  Surgeon: Arvil Persons, MD;  Location: [facility];  Service: Urology;  Laterality: N/A;  . skin cancers removed  2018   left arm  . UPPER GASTROINTESTINAL ENDOSCOPY  2007 DYSPEPSIA D50 V4   NSAID GASTRITIS    Home Medications:  Medications Prior to Admission  Medication Sig Dispense Refill Last Dose  . albuterol (PROVENTIL HFA;VENTOLIN HFA) 108 (90 BASE) MCG/ACT inhaler Inhale 2 puffs into the lungs every 6 (six) hours as needed.     04/28/2018 at Unknown time  . Alendronate Sodium (BINOSTO) 70 MG TBEF Take 70 mg by mouth once a week. Takes on wednesdays   Past Week at Unknown time  . aspirin 81 MG tablet Take 81 mg by mouth daily.     Past Week at Unknown time  . budesonide-formoterol (SYMBICORT) 160-4.5 MCG/ACT inhaler Inhale 2 puffs into the lungs 2 (two) times daily.   04/29/2018 at 06000  . clonazePAM (KLONOPIN) 2 MG tablet Take 1 mg by mouth at bedtime as needed.   04/28/2018 at Unknown time  . doxazosin (CARDURA) 4 MG tablet Take 4 mg by mouth at bedtime.    04/28/2018 at Unknown time  .  ergocalciferol (VITAMIN D2) 50000 UNITS capsule Take 50,000 Units by mouth once a week. Takes on monday   Past Week at Unknown time  . hydrochlorothiazide 25 MG tablet Take 25 mg by mouth every morning. Takes 12.5   Past Month at Unknown time  . losartan (COZAAR) 100 MG tablet Take 100 mg by mouth every morning.    04/28/2018 at Unknown time  . MAGNESIUM PO Take 500 mg by mouth daily.   Past Month at Unknown time  . Multiple Vitamin (MULTIVITAMIN) capsule Take 1 capsule by mouth daily.     Past Month at Unknown time  . omeprazole (PRILOSEC) 20 MG capsule Take 20 mg by mouth every morning.    04/28/2018 at Unknown time  . Red Yeast Rice 600 MG CAPS Take 1 capsule by mouth every morning.    Past Month at Unknown time  . tiotropium (SPIRIVA) 18 MCG inhalation capsule  Place 1 capsule (18 mcg total) into inhaler and inhale daily. 30 capsule 12 04/28/2018 at Unknown time  . HYDROcodone-acetaminophen (NORCO/VICODIN) 5-325 MG tablet Take 1 tablet by mouth every 4 (four) hours as needed. (Patient not taking: Reported on 04/14/2018) 15 tablet 0 More than a month at Unknown time  . leuprolide (LUPRON) 22.5 MG injection Inject 22.5 mg into the muscle every 3 (three) months.   Taking   Allergies:  Allergies  Allergen Reactions  . Guaifenesin Er Other (See Comments)    dizzy  . Roflumilast Palpitations    Heart race fast, depressed    Family History  Problem Relation Age of Onset  . Prostate cancer Father   . Rectal cancer Sister   . Colon cancer Neg Hx   . Colon polyps Neg Hx    Social History:  reports that he quit smoking about 38 years ago. His smoking use included cigarettes. He has a 45.00 pack-year smoking history. He has never used smokeless tobacco. He reports that he drinks alcohol. He reports that he does not use drugs.  ROS: A complete review of systems was performed.  All systems are negative except for pertinent findings as noted. Review of Systems  All other systems reviewed and are negative.    Physical Exam:  Vital signs in last 24 hours: Temp:  [97.4 F (36.3 C)] 97.4 F (36.3 C) (11/08 0746) Pulse Rate:  [84] 84 (11/08 0746) Resp:  [16] 16 (11/08 0746) BP: (178)/(86) 178/86 (11/08 0746) SpO2:  [95 %] 95 % (11/08 0746) Weight:  [86.4 kg] 86.4 kg (11/08 0746) General:  Alert and oriented, No acute distress HEENT: Normocephalic, atraumatic Cardiovascular: Regular rate and rhythm Lungs: Regular rate and effort Abdomen: Soft, nontender, nondistended, no abdominal masses Back: No CVA tenderness Extremities: No edema Neurologic: Grossly intact  Laboratory Data:  Results for orders placed or performed during the hospital encounter of 04/29/18 (from the past 24 hour(s))  I-STAT 4, (NA,K, GLUC, HGB,HCT)     Status: None    Collection Time: 04/29/18  8:59 AM  Result Value Ref Range   Sodium 143 135 - 145 mmol/L   Potassium 4.0 3.5 - 5.1 mmol/L   Glucose, Bld 92 70 - 99 mg/dL   HCT 41.0 39.0 - 52.0 %   Hemoglobin 13.9 13.0 - 17.0 g/dL   No results found for this or any previous visit (from the past 240 hour(s)). Creatinine: No results for input(s): CREATININE in the last 168 hours.  Impression/Assessment:  Phimosis, frequency-  Plan:  Dorsal slit and flexible cystoscopy - I  discussed with the patient the nature, potential benefits, risks and alternatives to the above procedure, including side effects of the proposed treatment, the likelihood of the patient achieving the goals of the procedure, and any potential problems that might occur during the procedure or recuperation. All questions answered. Patient elects to proceed.  Dr. Annamaria Boots did not feel the patient's pulmonary disease was prohibitive of anesthesia.  We appreciate his input.   Festus Aloe 04/29/2018, 10:11 AM

## 2018-05-02 ENCOUNTER — Encounter (HOSPITAL_BASED_OUTPATIENT_CLINIC_OR_DEPARTMENT_OTHER): Payer: Self-pay | Admitting: Urology

## 2018-05-11 ENCOUNTER — Ambulatory Visit (INDEPENDENT_AMBULATORY_CARE_PROVIDER_SITE_OTHER)
Admission: RE | Admit: 2018-05-11 | Discharge: 2018-05-11 | Disposition: A | Discharge status: Home / Self Care | Payer: Medicare HMO | Source: Ambulatory Visit | Attending: Internal Medicine | Admitting: Internal Medicine

## 2018-05-11 DIAGNOSIS — J449 Chronic obstructive pulmonary disease, unspecified: Secondary | ICD-10-CM | POA: Diagnosis not present

## 2018-08-24 ENCOUNTER — Encounter: Payer: Self-pay | Admitting: Internal Medicine

## 2018-08-24 ENCOUNTER — Ambulatory Visit (INDEPENDENT_AMBULATORY_CARE_PROVIDER_SITE_OTHER): Payer: Medicare HMO | Admitting: Internal Medicine

## 2018-08-24 VITALS — BP 152/80 | Ht 70.0 in | Wt 193.0 lb

## 2018-08-24 DIAGNOSIS — K219 Gastro-esophageal reflux disease without esophagitis: Secondary | ICD-10-CM

## 2018-08-24 DIAGNOSIS — J449 Chronic obstructive pulmonary disease, unspecified: Secondary | ICD-10-CM | POA: Diagnosis not present

## 2018-08-24 NOTE — Patient Instructions (Signed)
We can continue current meds. Use the pneumatic vest if you feel you need more help clearing your airways.   Please fall if we can help

## 2018-08-24 NOTE — Progress Notes (Signed)
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  Walked 3 laps = 555 feet. ----------------------------------------------------------------------------------------------------------------------- 04/14/2018- 83 year old male [place] former smoker followed for chronic bronchitis/bronchiectasis COPD, insomnia, complicated by HBP, chronic gastritis, GERD, atherosclerosis, prostate cancer -----Follows for: COPD, Dr. Junious Silk [facility] is performing the surgery, clearing scar tissue in the urethra Spiriva HandiHaler, Symbicort 160, albuterol HFA, And exacerbation of bronchitis was treated at urgent care 10/16 with amoxicillin, prednisone.  He feels he is back to his baseline now.  Coughs productively during the night and is clearest in the mornings.  No fever.  Walks 1 mile per day if weather is good, limited as much by arthritis in his feet is by his breathing.  He expresses no acute concerns today.  Office Spirometry 04/14/2018-moderately severe obstructive airways disease, FVC 2.3/58%, FEV1 1.5/53%, ratio 0.66, FEF 25-75% 0.9/45%  08/24/2018- 83 year old male [place] former smoker followed for chronic bronchitis/bronchiectasis COPD, insomnia, complicated by HBP, chronic gastritis, GERD, atherosclerosis, prostate cancer Spiriva HandiHaler, Symbicort 160, albuterol HFA, -----breathing varies; DOE, cough w/ sometimes white, yellow, or green mucus Arrival BP 160/98- for recheck: 152/80 He reports breathing at baseline.  Chronic cough remains usually productive with white/ yellow sputum but  no blood.  Dyspnea on exertion with ADLs but not worse.  No fever or chest pain.  Has pneumatic vest used when needed but he has not felt he needed it recently. CXR  05/11/2018- Decreased right basilar opacity is noted suggesting improving atelectasis or infiltrate.  ROS-See HPI  + = positive Constitutional:   No-   weight loss, night sweats, fevers, chills, fatigue, +lassitude. HEENT:   No-  headaches, difficulty swallowing, tooth/dental problems, sore throat,       No-  sneezing, itching, ear ache,     no current- nasal congestion, post nasal drip,  CV:  No-chest pain, orthopnea, PND, swelling in lower extremities, anasarca, dizziness, palpitations Resp: + shortness of breath with exertion or at rest.                +productive cough,  + non-productive cough,  No- coughing up of blood.              No-change in color of mucus.  No- wheezing.   Skin: No-   rash or lesions. GI: + heartburn, indigestion,  No-abdominal pain, nausea, vomiting,  GU:  MS:  No-   joint pain or swelling.    + back pain. Neuro-     nothing unusual Psych:  No- change in mood or affect. No depression or anxiety.  No memory loss.  OBJ General- Alert, Oriented, Affect-appropriate, Distress- none acute; +overweight Skin- rash-none, lesions- none, excoriation- none Lymphadenopathy- none Head- atraumatic            Eyes- Gross vision intact, PERRLA, conjunctivae clear secretions            Ears- Hearing, canals-normal            Nose- Clear, no-Septal dev, mucus, polyps, erosion, perforation             Throat- Mallampati III , mucosa cobblestoned , drainage- none, tonsils- atrophic Neck- flexible , trachea midline, no stridor , thyroid nl,  carotid no bruit Chest - symmetrical excursion , unlabored           Heart/CV- RRR , no murmur , no gallop  , no rub, nl s1 s2                           - JVD- none , edema- none, stasis changes- none, varices- none           Lung-+ faint bilateral rhonchi,+ slow exhalation,  Unlabored , dullness-none, rub- none,            Chest wall-  Abd-  Br/ Gen/ Rectal- Not done, not indicated Extrem- cyanosis- none, clubbing, none, atrophy- none, strength- nl Neuro- grossly intact to observation

## 2018-09-14 NOTE — Assessment & Plan Note (Signed)
Chronic bronchitis/bronchiectasis without acute exacerbation.  He sounds about as good as I have heard him in quite a while.  He does seem to be doing a good job of maintaining pulmonary toilet.  Oxygen needs have not changed.  He does not find bronchodilators helpful.

## 2018-09-14 NOTE — Assessment & Plan Note (Signed)
Recurrent aspiration in the past is thought to have been a significant factor in development of bronchiectasis.  Reflux precautions were emphasized.

## 2018-12-28 ENCOUNTER — Other Ambulatory Visit: Payer: Self-pay

## 2018-12-29 ENCOUNTER — Ambulatory Visit (INDEPENDENT_AMBULATORY_CARE_PROVIDER_SITE_OTHER): Payer: Medicare HMO | Admitting: Family Medicine

## 2018-12-29 ENCOUNTER — Encounter: Payer: Self-pay | Admitting: Family Medicine

## 2018-12-29 VITALS — BP 133/64 | HR 68 | Temp 97.4°F | Ht 69.0 in | Wt 189.0 lb

## 2018-12-29 DIAGNOSIS — R350 Frequency of micturition: Secondary | ICD-10-CM

## 2018-12-29 DIAGNOSIS — M810 Age-related osteoporosis without current pathological fracture: Secondary | ICD-10-CM | POA: Diagnosis not present

## 2018-12-29 DIAGNOSIS — E559 Vitamin D deficiency, unspecified: Secondary | ICD-10-CM | POA: Diagnosis not present

## 2018-12-29 DIAGNOSIS — C61 Malignant neoplasm of prostate: Secondary | ICD-10-CM | POA: Diagnosis not present

## 2018-12-29 DIAGNOSIS — K219 Gastro-esophageal reflux disease without esophagitis: Secondary | ICD-10-CM

## 2018-12-29 DIAGNOSIS — I10 Essential (primary) hypertension: Secondary | ICD-10-CM | POA: Diagnosis not present

## 2018-12-29 DIAGNOSIS — Z7689 Persons encountering health services in other specified circumstances: Secondary | ICD-10-CM | POA: Diagnosis not present

## 2018-12-29 DIAGNOSIS — M1990 Unspecified osteoarthritis, unspecified site: Secondary | ICD-10-CM | POA: Insufficient documentation

## 2018-12-29 DIAGNOSIS — N400 Enlarged prostate without lower urinary tract symptoms: Secondary | ICD-10-CM | POA: Insufficient documentation

## 2018-12-29 DIAGNOSIS — N401 Enlarged prostate with lower urinary tract symptoms: Secondary | ICD-10-CM | POA: Diagnosis not present

## 2018-12-29 MED ORDER — BINOSTO 70 MG PO TBEF: 70.0000 mg | EFFERVESCENT_TABLET | ORAL | 6 refills | Status: DC

## 2018-12-29 MED ORDER — LOSARTAN POTASSIUM 100 MG PO TABS: 100.0000 mg | ORAL_TABLET | Freq: Every morning | ORAL | 1 refills | Status: DC

## 2018-12-29 MED ORDER — OMEPRAZOLE 20 MG PO CPDR: 20.0000 mg | DELAYED_RELEASE_CAPSULE | Freq: Every morning | ORAL | 1 refills | Status: DC

## 2018-12-29 MED ORDER — DOXAZOSIN MESYLATE 4 MG PO TABS: 4.0000 mg | ORAL_TABLET | Freq: Every day | ORAL | 1 refills | Status: DC

## 2018-12-29 NOTE — Progress Notes (Signed)
Subjective:  Patient ID: [name], male    DOB: 02-08-1936, 83 y.o.   MRN: [id number]  Chief Complaint:  Establish Care   HPI: [name] is a 83 y.o. male presenting on 12/29/2018 for Establish Care   1. Encounter to establish care  Pt presents today to establish care with new PCP. Pt was seeing Dr. Edrick Oh who has retired. Pt states he was seeing him for the last year. States he went to Dr. Melina Copa prior to that.    2. Age-related osteoporosis without current pathological fracture  Osteoporosis due to age and medications for prostate cancer. Pt states he has been on calcium and alendronate for several years. No jaw pain, trouble chewing, or loose teeth.    3. Essential hypertension  Complaint with meds - Yes Checking BP at home - No Exercising Regularly - No Watching Salt intake - No Pertinent ROS:  Headache - No Chest pain - No Dyspnea - No Palpitations - No LE edema - No They report good compliance with medications and can restate their regimen by memory. No medication side effects.  BP Readings from Last 3 Encounters:  12/29/18 133/64  08/24/18 (!) 152/80  04/29/18 (!) 153/67     4. Gastroesophageal reflux disease without esophagitis  Compliant with medications without associated side effects. No hemoptysis, sore throat, voice changes, cough, or dysphagia.    5. Benign prostatic hyperplasia with urinary frequency  Prostate Cancer [facility]) Followed by urology and oncology. States he is currently taking Lupron and doxazosin. States he still has urinary frequency and nocturia. No fever, chills, weakness, confusion, or weight loss.    6. Vitamin D deficiency  On oral repletion therapy weekly. Tolerating well. No recent fractures.      Relevant past medical, surgical, family, and social history reviewed and updated as indicated.  Allergies and medications reviewed and updated.   Past Medical History:  Diagnosis Date  . Arthritis    neck, back, feet and  ankles, feet and ankles swell due to arthritis, prop feet up swelling goes down  . Bronchiectasis without complication ([place])    bronchitis oct 2019 got clearance for surgery  . COPD (chronic obstructive pulmonary disease) with chronic bronchitis ([place])    pulmologist-  dr Tarri Fuller young  . Dyspnea    with heavy exertion  . GERD (gastroesophageal reflux disease)   . History of colon polyps    2007--  simple adenoma  . History of gastritis    SECONDARY TO ADVIL  . History of skin cancer    EXCISION LEFT SIDE OF FACE and neck  . Hypertension   . Insomnia   . Nocturia   . Prostate cancer [facility])    dx  2009--  XRT takine lupron shots for last shot oct 2019    Past Surgical History:  Procedure Laterality Date  . CATARACT EXTRACTION W/ INTRAOCULAR LENS  IMPLANT, [place]  . COLONOSCOPY  JUL 2007 D25 V1   Simple adenomas(RECTUM), HYPERPLASTIC COLON POLYPS, [place] TICS  . COLONOSCOPY  03/18/2011   Procedure: COLONOSCOPY;  Surgeon: Dorothyann Peng, MD;  [facility];  Service: Endoscopy;  Laterality: N/A;  8:30  . CRYOABLATION N/A 12/29/2013   Procedure: CRYO ABLATION PROSTATE;  Surgeon: Arvil Persons, MD;  Location: [facility];  Service: Urology;  Laterality: N/A;  . CYSTOSCOPY N/A 04/29/2018   Procedure: Erlene Quan;  Surgeon: Festus Aloe, MD;  Location: [facility];  Service: Urology;  Laterality: N/A;  . skin cancers removed  2018   left arm  . UPPER GASTROINTESTINAL ENDOSCOPY  2007 DYSPEPSIA D50 V4   NSAID GASTRITIS    Social History   Socioeconomic History  . Marital status: Divorced    Spouse name: Not on file  . Number of children: Not on file  . Years of education: Not on file  . Highest education level: Not on file  Occupational History  . Not on file  Social Needs  . Financial resource strain: Not on file  . Food insecurity    Worry: Not on file    Inability: Not on file  . Transportation needs     Medical: Not on file    Non-medical: Not on file  Tobacco Use  . Smoking status: Former Smoker    Packs/day: 1.50    Years: 30.00    Pack years: 45.00    Types: Cigarettes    Quit date: 06/23/1979    Years since quitting: 39.5  . Smokeless tobacco: Never Used  Substance and Sexual Activity  . Alcohol use: Yes    Comment: occ whiskey helps with bronchitis  . Drug use: No  . Sexual activity: Not on file  Lifestyle  . Physical activity    Days per week: Not on file    Minutes per session: Not on file  . Stress: Not on file  Relationships  . Social Herbalist on phone: Not on file    Gets together: Not on file    Attends religious service: Not on file    Active member of club or organization: Not on file    Attends meetings of clubs or organizations: Not on file    Relationship status: Not on file  . Intimate partner violence    Fear of current or ex partner: Not on file    Emotionally abused: Not on file    Physically abused: Not on file    Forced sexual activity: Not on file  Other Topics Concern  . Not on file  Social History Narrative  . Not on file    Outpatient Encounter Medications as of 12/29/2018  Medication Sig  . albuterol (PROVENTIL HFA;VENTOLIN HFA) 108 (90 BASE) MCG/ACT inhaler Inhale 2 puffs into the lungs every 6 (six) hours as needed.    . Alendronate Sodium (BINOSTO) 70 MG TBEF Take 70 mg by mouth once a week. Takes on wednesdays  . aspirin 81 MG tablet Take 81 mg by mouth daily.    . budesonide-formoterol (SYMBICORT) 160-4.5 MCG/ACT inhaler Inhale 2 puffs into the lungs 2 (two) times daily.  . clonazePAM (KLONOPIN) 2 MG tablet Take 1 mg by mouth at bedtime as needed.  . doxazosin (CARDURA) 4 MG tablet Take 1 tablet (4 mg total) by mouth at bedtime.  . ergocalciferol (VITAMIN D2) 50000 UNITS capsule Take 50,000 Units by mouth once a week. Takes on monday  . hydrochlorothiazide 25 MG tablet Take 25 mg by mouth every morning. Takes 12.5  .  leuprolide (LUPRON) 22.5 MG injection Inject 22.5 mg into the muscle every 3 (three) months.  Marland Kitchen losartan (COZAAR) 100 MG tablet Take 1 tablet (100 mg total) by mouth every morning.  Marland Kitchen MAGNESIUM PO Take 500 mg by mouth daily.  . Multiple Vitamin (MULTIVITAMIN) capsule Take 1 capsule by mouth daily.    Marland Kitchen omeprazole (PRILOSEC) 20 MG capsule Take 1 capsule (20 mg total) by mouth every morning.  Marland Kitchen  Red Yeast Rice 600 MG CAPS Take 1 capsule by mouth every morning.   . tiotropium (SPIRIVA) 18 MCG inhalation capsule Place 1 capsule (18 mcg total) into inhaler and inhale daily.  . [DISCONTINUED] Alendronate Sodium (BINOSTO) 70 MG TBEF Take 70 mg by mouth once a week. Takes on wednesdays  . [DISCONTINUED] doxazosin (CARDURA) 4 MG tablet Take 4 mg by mouth at bedtime.   . [DISCONTINUED] losartan (COZAAR) 100 MG tablet Take 100 mg by mouth every morning.   . [DISCONTINUED] omeprazole (PRILOSEC) 20 MG capsule Take 20 mg by mouth every morning.   . [DISCONTINUED] diclofenac (VOLTAREN) 75 MG EC tablet Take 75 mg by mouth daily.    No facility-administered encounter medications on file as of 12/29/2018.     Allergies  Allergen Reactions  . Guaifenesin Er Other (See Comments)    dizzy  . Roflumilast Palpitations    Heart race fast, depressed    Review of Systems  Constitutional: Negative for chills, fatigue, fever and unexpected weight change.  HENT: Negative for sore throat, trouble swallowing and voice change.   Eyes: Negative for photophobia and visual disturbance.  Respiratory: Negative for cough, choking, chest tightness, shortness of breath and wheezing.   Cardiovascular: Negative for chest pain and palpitations.  Gastrointestinal: Negative for abdominal pain, anal bleeding, blood in stool, constipation, diarrhea, nausea, rectal pain and vomiting.  Endocrine: Negative for cold intolerance, heat intolerance, polydipsia, polyphagia and polyuria.  Genitourinary: Positive for difficulty urinating,  frequency and urgency. Negative for decreased urine volume, discharge, flank pain, hematuria, penile pain, penile swelling, scrotal swelling and testicular pain.  Musculoskeletal: Positive for arthralgias (chronic), back pain (chronic) and gait problem (uses cane to assist with ambulation).  Skin: Negative for color change and pallor.  Neurological: Negative for dizziness, tremors, seizures, syncope, facial asymmetry, speech difficulty, weakness, light-headedness, numbness and headaches.  Hematological: Bruises/bleeds easily.  Psychiatric/Behavioral: Negative for confusion.  All other systems reviewed and are negative.       Objective:  BP 133/64   Pulse 68   Temp (!) 97.4 F (36.3 C) (Oral)   Ht _0  (1.753 m)   Wt 189 lb (85.7 kg)   BMI 27.91 kg/m    Wt Readings from Last 3 Encounters:  12/29/18 189 lb (85.7 kg)  08/24/18 193 lb (87.5 kg)  04/29/18 190 lb 6.4 oz (86.4 kg)    Physical Exam Vitals signs and nursing note reviewed.  Constitutional:      General: He is not in acute distress.    Appearance: Normal appearance. He is well-developed and well-groomed. He is obese. He is not ill-appearing, toxic-appearing or diaphoretic.  HENT:     Head: Normocephalic and atraumatic.     Jaw: There is normal jaw occlusion.     Right Ear: Hearing normal.     Left Ear: Hearing normal.     Nose: Nose normal.     Mouth/Throat:     Lips: Pink.     Mouth: Mucous membranes are moist.     Pharynx: Oropharynx is clear. Uvula midline.  Eyes:     General: Lids are normal.     Extraocular Movements: Extraocular movements intact.     Conjunctiva/sclera: Conjunctivae normal.     Pupils: Pupils are equal, round, and reactive to light.  Neck:     Musculoskeletal: Normal range of motion and neck supple.     Thyroid: No thyroid mass, thyromegaly or thyroid tenderness.     Vascular: No carotid bruit or JVD.  Trachea: Trachea and phonation normal.  Cardiovascular:     Rate and Rhythm:  Normal rate and regular rhythm.     Chest Wall: PMI is not displaced.     Pulses: Normal pulses.     Heart sounds: Normal heart sounds. No murmur. No friction rub. No gallop.   Pulmonary:     Effort: Pulmonary effort is normal. No respiratory distress.     Breath sounds: Normal breath sounds. No wheezing.  Abdominal:     General: Bowel sounds are normal. There is no distension or abdominal bruit.     Palpations: Abdomen is soft. There is no hepatomegaly or splenomegaly.     Tenderness: There is no abdominal tenderness. There is no right CVA tenderness or left CVA tenderness.     Hernia: No hernia is present.  Musculoskeletal:     Right hip: Normal.     Left hip: Normal.     Right knee: He exhibits decreased range of motion. He exhibits no swelling, no effusion, no ecchymosis, no deformity, no laceration, no erythema, normal alignment, no LCL laxity, normal patellar mobility, no bony tenderness, normal meniscus and no MCL laxity. No tenderness found.     Left knee: He exhibits decreased range of motion. He exhibits no swelling, no effusion, no ecchymosis, no deformity, no laceration, no erythema, normal alignment, no LCL laxity, normal patellar mobility, no bony tenderness, normal meniscus and no MCL laxity. No tenderness found.     Right ankle: He exhibits decreased range of motion. He exhibits no swelling, no ecchymosis, no deformity, no laceration and normal pulse. No tenderness. Achilles tendon exhibits no pain.     Left ankle: He exhibits decreased range of motion. He exhibits no swelling, no ecchymosis, no deformity, no laceration and normal pulse. No tenderness. Achilles tendon exhibits no pain and no defect.     Right lower leg: No edema.     Left lower leg: No edema.  Lymphadenopathy:     Cervical: No cervical adenopathy.  Skin:    General: Skin is warm and dry.     Capillary Refill: Capillary refill takes less than 2 seconds.     Coloration: Skin is not cyanotic, jaundiced or pale.      Findings: No rash.  Neurological:     General: No focal deficit present.     Mental Status: He is alert and oriented to person, place, and time.     Cranial Nerves: Cranial nerves are intact.     Sensory: Sensation is intact.     Motor: Motor function is intact.     Coordination: Coordination is intact.     Gait: Gait is intact.     Deep Tendon Reflexes: Reflexes are normal and symmetric.  Psychiatric:        Attention and Perception: Attention and perception normal.        Mood and Affect: Mood and affect normal.        Speech: Speech normal.        Behavior: Behavior normal. Behavior is cooperative.        Thought Content: Thought content normal.        Cognition and Memory: Cognition and memory normal.        Judgment: Judgment normal.     Results for orders placed or performed during the hospital encounter of 04/29/18  I-STAT 4, (NA,K, GLUC, HGB,HCT)  Result Value Ref Range   Sodium 143 135 - 145 mmol/L   Potassium 4.0 3.5 - 5.1  mmol/L   Glucose, Bld 92 70 - 99 mg/dL   HCT 41.0 39.0 - 52.0 %   Hemoglobin 13.9 13.0 - 17.0 g/dL       Pertinent labs & imaging results that were available during my care of the patient were reviewed by me and considered in my medical decision making.  Assessment & Plan:  Barth was seen today for establish care.  Diagnoses and all orders for this visit:  Encounter to establish care -     CMP14+EGFR -     CBC with Differential/Platelet -     Lipid panel -     Thyroid Panel With TSH -     VITAMIN D 25 Hydroxy (Vit-D Deficiency, Fractures)  Age-related osteoporosis without current pathological fracture Continue calcium and alendronate as prescribed. Report any new or worsening symptoms.  -     Alendronate Sodium (BINOSTO) 70 MG TBEF; Take 70 mg by mouth once a week. Takes on wednesdays  Essential hypertension DASH diet and exercise encouraged. Labs pending. Continue below.  -     losartan (COZAAR) 100 MG tablet; Take 1 tablet (100  mg total) by mouth every morning. -     CMP14+EGFR -     CBC with Differential/Platelet -     Lipid panel -     Thyroid Panel With TSH  Gastroesophageal reflux disease without esophagitis Well controlled with current medications. Will check CBC today. Continue medications as prescribed.  -     omeprazole (PRILOSEC) 20 MG capsule; Take 1 capsule (20 mg total) by mouth every morning. -     CBC with Differential/Platelet  Benign prostatic hyperplasia with urinary frequency Prostate cancer ([place]) Followed by urology and oncology.  -     doxazosin (CARDURA) 4 MG tablet; Take 1 tablet (4 mg total) by mouth at bedtime.  Vitamin D deficiency Continue repletion therapy. Will check levels today.  -     VITAMIN D 25 Hydroxy (Vit-D Deficiency, Fractures)      Continue all other maintenance medications.  Follow up plan: Return in about 3 months (around 03/31/2019), or if symptoms worsen or fail to improve, for HTN.   The above assessment and management plan was discussed with the patient. The patient verbalized understanding of and has agreed to the management plan. Patient is aware to call the clinic if symptoms persist or worsen. Patient is aware when to return to the clinic for a follow-up visit. Patient educated on when it is appropriate to go to the emergency department.   Monia Pouch, FNP-C [facility] [phone]

## 2018-12-30 LAB — LIPID PANEL
Chol/HDL Ratio: 3.2 ratio (ref 0.0–5.0)
Cholesterol, Total: 166 mg/dL (ref 100–199)
HDL: 52 mg/dL (ref >39)
LDL Calculated: 95 mg/dL (ref 0–99)
Triglycerides: 95 mg/dL (ref 0–149)
VLDL Cholesterol Cal: 19 mg/dL (ref 5–40)

## 2018-12-30 LAB — CBC WITH DIFFERENTIAL/PLATELET
Basophils Absolute: 0.1 10*3/uL (ref 0.0–0.2)
Basos: 1 %
EOS (ABSOLUTE): 0.4 10*3/uL (ref 0.0–0.4)
Eos: 6 %
Hematocrit: 40.5 % (ref 37.5–51.0)
Hemoglobin: 13.9 g/dL (ref 13.0–17.7)
Immature Grans (Abs): 0 10*3/uL (ref 0.0–0.1)
Immature Granulocytes: 1 %
Lymphocytes Absolute: 1.2 10*3/uL (ref 0.7–3.1)
Lymphs: 17 %
MCH: 32.2 pg (ref 26.6–33.0)
MCHC: 34.3 g/dL (ref 31.5–35.7)
MCV: 94 fL (ref 79–97)
Monocytes Absolute: 0.6 10*3/uL (ref 0.1–0.9)
Monocytes: 8 %
Neutrophils Absolute: 4.8 10*3/uL (ref 1.4–7.0)
Neutrophils: 67 %
Platelets: 189 10*3/uL (ref 150–450)
RBC: 4.32 x10E6/uL (ref 4.14–5.80)
RDW: 12.3 % (ref 11.6–15.4)
WBC: 7.2 10*3/uL (ref 3.4–10.8)

## 2018-12-30 LAB — CMP14+EGFR
ALT: 12 IU/L (ref 0–44)
AST: 18 IU/L (ref 0–40)
Albumin/Globulin Ratio: 2.1 (ref 1.2–2.2)
Albumin: 4.1 g/dL (ref 3.6–4.6)
Alkaline Phosphatase: 75 IU/L (ref 39–117)
BUN/Creatinine Ratio: 22 (ref 10–24)
BUN: 22 mg/dL (ref 8–27)
Bilirubin Total: 0.4 mg/dL (ref 0.0–1.2)
CO2: 24 mmol/L (ref 20–29)
Calcium: 9.8 mg/dL (ref 8.6–10.2)
Chloride: 100 mmol/L (ref 96–106)
Creatinine, Ser: 1.02 mg/dL (ref 0.76–1.27)
GFR calc Af Amer: 79 mL/min/{1.73_m2} (ref >59)
GFR calc non Af Amer: 69 mL/min/{1.73_m2} (ref >59)
Globulin, Total: 2 g/dL (ref 1.5–4.5)
Glucose: 93 mg/dL (ref 65–99)
Potassium: 4 mmol/L (ref 3.5–5.2)
Sodium: 141 mmol/L (ref 134–144)
Total Protein: 6.1 g/dL (ref 6.0–8.5)

## 2018-12-30 LAB — THYROID PANEL WITH TSH
Free Thyroxine Index: 1.9 (ref 1.2–4.9)
T3 Uptake Ratio: 28 % (ref 24–39)
T4, Total: 6.7 ug/dL (ref 4.5–12.0)
TSH: 4.42 u[IU]/mL (ref 0.450–4.500)

## 2018-12-30 LAB — VITAMIN D 25 HYDROXY (VIT D DEFICIENCY, FRACTURES): Vit D, 25-Hydroxy: 72 ng/mL (ref 30.0–100.0)

## 2019-01-04 ENCOUNTER — Telehealth: Payer: Self-pay | Admitting: Family Medicine

## 2019-01-05 NOTE — Telephone Encounter (Signed)
Patient states that pharmacy is going to send a paper about a medication.

## 2019-01-18 ENCOUNTER — Ambulatory Visit: Payer: Medicare HMO

## 2019-02-24 ENCOUNTER — Ambulatory Visit (INDEPENDENT_AMBULATORY_CARE_PROVIDER_SITE_OTHER): Payer: Medicare HMO

## 2019-02-24 ENCOUNTER — Ambulatory Visit: Payer: Medicare HMO | Admitting: Internal Medicine

## 2019-02-24 ENCOUNTER — Encounter: Payer: Self-pay | Admitting: Internal Medicine

## 2019-02-24 ENCOUNTER — Other Ambulatory Visit: Payer: Self-pay

## 2019-02-24 ENCOUNTER — Telehealth: Payer: Self-pay | Admitting: Internal Medicine

## 2019-02-24 VITALS — BP 152/72 | HR 78 | Temp 98.2°F | Ht 70.0 in | Wt 189.4 lb

## 2019-02-24 DIAGNOSIS — J471 Bronchiectasis with (acute) exacerbation: Secondary | ICD-10-CM

## 2019-02-24 DIAGNOSIS — J449 Chronic obstructive pulmonary disease, unspecified: Secondary | ICD-10-CM | POA: Diagnosis not present

## 2019-02-24 DIAGNOSIS — Z23 Encounter for immunization: Secondary | ICD-10-CM | POA: Diagnosis not present

## 2019-02-24 MED ORDER — AMOXICILLIN-POT CLAVULANATE 875-125 MG PO TABS: 1.0000 | ORAL_TABLET | Freq: Two times a day (BID) | ORAL | 0 refills | Status: DC

## 2019-02-24 NOTE — Patient Instructions (Addendum)
Order- flu vax senior  Order- Sputum culture routine C&S      Dx bronchiectasis with exacerbation  Order- CXR     Dx bronchiectasis with exacerb  Script sent for augmentin antibiotic   Use the flutter tube or the Vest daily or twice daily whenever you begin to feel more congested like this.  Please call if we can help

## 2019-02-24 NOTE — Progress Notes (Signed)
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  Walked 3 laps = 555 feet. Office Spirometry 04/14/2018-moderately severe obstructive airways disease, FVC 2.3/58%, FEV1 1.5/53%, ratio 0.66, FEF 25-75% 0.9/45% -------------------------------------------------------------------------------------------------------------- 08/24/2018- 83 year old male [place] former smoker followed for chronic bronchitis/bronchiectasis COPD, insomnia, complicated by HBP, chronic gastritis, GERD, atherosclerosis, prostate cancer Spiriva HandiHaler, Symbicort 160, albuterol HFA, -----breathing varies; DOE, cough w/ sometimes white, yellow, or green mucus Arrival BP 160/98- for recheck: 152/80 He reports breathing at baseline.  Chronic cough remains usually productive with white/ yellow sputum but no blood.  Dyspnea on exertion with ADLs but not worse.  No fever or chest pain.  Has pneumatic vest used when needed but he has not felt he needed it recently. CXR  05/11/2018- Decreased right basilar opacity is noted suggesting improving atelectasis or infiltrate.  02/24/2019- 83 year old male [place] former smoker followed for chronic bronchitis/bronchiectasis COPD, insomnia, complicated by HBP, chronic gastritis, GERD, atherosclerosis, prostate cancer Spiriva HandiHaler, Symbicort 160, albuterol HFA, -----pt states his breathing has been worse since LOV, reports DOE, congestion, cough w/ light or dark yellowish green mucus Arrival sat 92% room air Chronic cough usually productive at some time during day. Currently  darker. No fever. Uses pneumatic Vest about 1x/ week- doesn't think it is much help.  ROS-See HPI  + = positive Constitutional:   No-   weight loss, night sweats, fevers, chills, fatigue, +lassitude. HEENT:   No-  headaches, difficulty swallowing, tooth/dental problems, sore throat,       No-  sneezing, itching, ear ache,     no current- nasal congestion, post nasal drip,  CV:  No-chest pain, orthopnea, PND, swelling in lower extremities, anasarca, dizziness, palpitations Resp: + shortness of breath with exertion or at rest.                +productive cough,  + non-productive cough,  No- coughing up of blood.              +-change in color of mucus.  No- wheezing.   Skin: No-   rash or lesions. GI: + heartburn, indigestion,  No-abdominal pain, nausea, vomiting,  GU:  MS:  No-   joint pain or swelling.    + back pain. Neuro-     nothing unusual Psych:  No- change in mood or affect. No depression or anxiety.  No memory loss.  OBJ General- Alert, Oriented, Affect-appropriate, Distress- none acute; +overweight Skin- rash-none, lesions- none, excoriation- none Lymphadenopathy- none Head- atraumatic            Eyes- Gross vision intact, PERRLA, conjunctivae clear secretions            Ears- Hearing, canals-normal            Nose- Clear, no-Septal dev, mucus, polyps, erosion, perforation             Throat- Mallampati III , mucosa cobblestoned , drainage- none, tonsils- atrophic Neck- flexible , trachea midline, no stridor , thyroid nl, carotid no bruit Chest - symmetrical excursion , unlabored           Heart/CV- RRR , no murmur , no gallop  , no rub, nl s1 s2                           -  JVD- none , edema- none, stasis changes- none, varices- none           Lung-+ scattered bilateral rhonchi,+ slow exhalation, Unlabored , dullness-none, rub- none,            Chest wall-  Abd-  Br/ Gen/ Rectal- Not done, not indicated Extrem- cyanosis- none, clubbing, none, atrophy- none, strength-  nl Neuro- grossly intact to observation

## 2019-02-24 NOTE — Telephone Encounter (Signed)
Advised pt of results. Pt understood and nothing further is needed.    Notes recorded by Deneise Lever, MD on 02/24/2019 at 4:25 PM EDT  CXR- chest xray is stable with chronic changes but no pneumonia.

## 2019-02-28 ENCOUNTER — Other Ambulatory Visit: Payer: Self-pay | Admitting: *Deleted

## 2019-02-28 LAB — RESPIRATORY CULTURE OR RESPIRATORY AND SPUTUM CULTURE
MICRO NUMBER:: 849225
SPECIMEN QUALITY:: ADEQUATE

## 2019-02-28 MED ORDER — DOXYCYCLINE HYCLATE 100 MG PO TABS: 100.0000 mg | ORAL_TABLET | Freq: Two times a day (BID) | ORAL | 0 refills | Status: DC

## 2019-02-28 NOTE — Progress Notes (Signed)
Pt notified:Sputum culture is growing a kind of bacteria that is resistant to penicillin family drugs.  Recommend we order doxycycline 100 mg, # 20, 1 twice daily  Pt informed to d/c Augmentin and start Doxy.

## 2019-03-04 NOTE — Assessment & Plan Note (Signed)
Sputum more purulent by description w/o fever Plan- sputum culture, flu vax, Start augmentin, CXR, CBC

## 2019-03-04 NOTE — Assessment & Plan Note (Signed)
He doesn't recognize benefit from bronchodilators or airway clearance techniques.

## 2019-03-30 ENCOUNTER — Telehealth: Payer: Self-pay

## 2019-03-30 ENCOUNTER — Other Ambulatory Visit: Payer: Self-pay

## 2019-03-30 NOTE — Telephone Encounter (Signed)
This should be a phone visit per guidelines.

## 2019-03-30 NOTE — Telephone Encounter (Signed)
Patient was screened for the COVID questions since he has an appointment with [facility] tomorrow.  States he has cough, nasal congestion and SOB that has been going on for years due to his chronic bronchitis. States it has not gotten any worse and has not ran a fever.  Please advise if patient can come in to be seen or it needs to be a phone visit?

## 2019-03-30 NOTE — Telephone Encounter (Signed)
Patient aware.

## 2019-03-31 ENCOUNTER — Encounter: Payer: Self-pay | Admitting: Family Medicine

## 2019-03-31 ENCOUNTER — Telehealth: Payer: Self-pay | Admitting: Family Medicine

## 2019-03-31 ENCOUNTER — Ambulatory Visit (INDEPENDENT_AMBULATORY_CARE_PROVIDER_SITE_OTHER): Payer: Medicare HMO | Admitting: Family Medicine

## 2019-03-31 DIAGNOSIS — J449 Chronic obstructive pulmonary disease, unspecified: Secondary | ICD-10-CM

## 2019-03-31 DIAGNOSIS — I10 Essential (primary) hypertension: Secondary | ICD-10-CM | POA: Diagnosis not present

## 2019-03-31 NOTE — Telephone Encounter (Signed)
FYI

## 2019-03-31 NOTE — Telephone Encounter (Signed)
If he would like to be tested, he can go tot the testing site.

## 2019-03-31 NOTE — Progress Notes (Signed)
Virtual Visit via telephone Note Due to COVID-19 pandemic this visit was conducted virtually. This visit type was conducted due to national recommendations for restrictions regarding the COVID-19 Pandemic (e.g. social distancing, sheltering in place) in an effort to limit this patient's exposure and mitigate transmission in our community. All issues noted in this document were discussed and addressed.  A physical exam was not performed with this format.   I connected with [name] on 03/31/19 at 0750 by telephone and verified that I am speaking with the correct person using two identifiers. [name] is currently located at home and no one is currently with them during visit. The provider, Monia Pouch, FNP is located in their office at time of visit.  I discussed the limitations, risks, security and privacy concerns of performing an evaluation and management service by telephone and the availability of in person appointments. I also discussed with the patient that there may be a patient responsible charge related to this service. The patient expressed understanding and agreed to proceed.  Subjective:  Patient ID: [name], male    DOB: Apr 13, 1936, 83 y.o.   MRN: [id number]  Chief Complaint:  Medical Management of Chronic Issues, COPD, and Hypertension   HPI: [name] is a 83 y.o. male presenting on 03/31/2019 for Medical Management of Chronic Issues, COPD, and Hypertension  1. COPD mixed type ([place]) Pt reports a recent exacerbation. States he was seen by his pulmonologist and was placed on doxycycline and is now feeling much better. Pt states his using his medications as prescribed but still has some symptoms daily.  2. Essential hypertension Complaint with meds - Yes Current Medications - losartan, HCTZ Checking BP at home ranging 130/60 Exercising Regularly - No Watching Salt intake - No Pertinent ROS:  Headache - No Fatigue - No Visual Disturbances - No Chest pain  - No Dyspnea - No Palpitations - No LE edema - No They report good compliance with medications and can restate their regimen by memory. No medication side effects.  Family, social, and smoking history reviewed.   BP Readings from Last 3 Encounters:  02/24/19 (!) 152/72  12/29/18 133/64  08/24/18 (!) 152/80   CMP Latest Ref Rng & Units 12/29/2018 04/29/2018 12/29/2013  Glucose 65 - 99 mg/dL 93 92 97  BUN 8 - 27 mg/dL 22 - -  Creatinine 0.76 - 1.27 mg/dL 1.02 - -  Sodium 134 - 144 mmol/L 141 143 140  Potassium 3.5 - 5.2 mmol/L 4.0 4.0 3.8  Chloride 96 - 106 mmol/L 100 - -  CO2 20 - 29 mmol/L 24 - -  Calcium 8.6 - 10.2 mg/dL 9.8 - -  Total Protein 6.0 - 8.5 g/dL 6.1 - -  Total Bilirubin 0.0 - 1.2 mg/dL 0.4 - -  Alkaline Phos 39 - 117 IU/L 75 - -  AST 0 - 40 IU/L 18 - -  ALT 0 - 44 IU/L 12 - -       Relevant past medical, surgical, family, and social history reviewed and updated as indicated.  Allergies and medications reviewed and updated.   Past Medical History:  Diagnosis Date  . Arthritis    neck, back, feet and ankles, feet and ankles swell due to arthritis, prop feet up swelling goes down  . Bronchiectasis without complication ([facility])    bronchitis oct 2019 got clearance for surgery  . COPD (chronic obstructive pulmonary disease) with chronic bronchitis ([facility])    pulmologist-  dr  clinton young  . Dyspnea    with heavy exertion  . GERD (gastroesophageal reflux disease)   . History of colon polyps    2007--  simple adenoma  . History of gastritis    SECONDARY TO ADVIL  . History of skin cancer    EXCISION LEFT SIDE OF FACE and neck  . Hypertension   . Insomnia   . Nocturia   . Prostate cancer [facility])    dx  2009--  XRT takine lupron shots for last shot oct 2019    Past Surgical History:  Procedure Laterality Date  . CATARACT EXTRACTION W/ INTRAOCULAR LENS  IMPLANT, [place]  . COLONOSCOPY  JUL 2007 D25 V1   Simple adenomas(RECTUM), HYPERPLASTIC  COLON POLYPS, [place] [facility]  . COLONOSCOPY  03/18/2011   Procedure: COLONOSCOPY;  Surgeon: Dorothyann Peng, MD;  Location[facility];  Service: Endoscopy;  Laterality: N/A;  8:30  . CRYOABLATION N/A 12/29/2013   Procedure: CRYO ABLATION PROSTATE;  Surgeon: Arvil Persons, MD;  Location[facility];  Service: Urology;  Laterality: N/A;  . CYSTOSCOPY N/A 04/29/2018   Procedure: Erlene Quan;  Surgeon: Festus Aloe, MD;  Location: [facility];  Service: Urology;  Laterality: N/A;  . skin cancers removed  2018   left arm  . UPPER GASTROINTESTINAL ENDOSCOPY  2007 DYSPEPSIA D50 V4   NSAID GASTRITIS    Social History   Socioeconomic History  . Marital status: Divorced    Spouse name: Not on file  . Number of children: Not on file  . Years of education: Not on file  . Highest education level: Not on file  Occupational History  . Not on file  Social Needs  . Financial resource strain: Not on file  . Food insecurity    Worry: Not on file    Inability: Not on file  . Transportation needs    Medical: Not on file    Non-medical: Not on file  Tobacco Use  . Smoking status: Former Smoker    Packs/day: 1.50    Years: 30.00    Pack years: 45.00    Types: Cigarettes    Quit date: 06/23/1979    Years since quitting: 39.7  . Smokeless tobacco: Never Used  Substance and Sexual Activity  . Alcohol use: Yes    Comment: occ whiskey helps with bronchitis  . Drug use: No  . Sexual activity: Not on file  Lifestyle  . Physical activity    Days per week: Not on file    Minutes per session: Not on file  . Stress: Not on file  Relationships  . Social Herbalist on phone: Not on file    Gets together: Not on file    Attends religious service: Not on file    Active member of club or organization: Not on file    Attends meetings of clubs or organizations: Not on file    Relationship status: Not on file  . Intimate partner violence    Fear of current  or ex partner: Not on file    Emotionally abused: Not on file    Physically abused: Not on file    Forced sexual activity: Not on file  Other Topics Concern  . Not on file  Social History Narrative  . Not on file    Outpatient Encounter Medications as of 03/31/2019  Medication Sig  . albuterol (PROVENTIL HFA;VENTOLIN HFA) 108 (90  BASE) MCG/ACT inhaler Inhale 2 puffs into the lungs every 6 (six) hours as needed.    . Alendronate Sodium (BINOSTO) 70 MG TBEF Take 70 mg by mouth once a week. Takes on wednesdays  . aspirin 81 MG tablet Take 81 mg by mouth daily.    . budesonide-formoterol (SYMBICORT) 160-4.5 MCG/ACT inhaler Inhale 2 puffs into the lungs 2 (two) times daily.  . clonazePAM (KLONOPIN) 2 MG tablet Take 1 mg by mouth at bedtime as needed.  . doxazosin (CARDURA) 4 MG tablet Take 1 tablet (4 mg total) by mouth at bedtime.  Marland Kitchen doxycycline (VIBRA-TABS) 100 MG tablet Take 1 tablet (100 mg total) by mouth 2 (two) times daily.  . ergocalciferol (VITAMIN D2) 50000 UNITS capsule Take 50,000 Units by mouth once a week. Takes on monday  . hydrochlorothiazide 25 MG tablet Take 25 mg by mouth every morning. Takes 12.5  . leuprolide (LUPRON) 22.5 MG injection Inject 22.5 mg into the muscle every 3 (three) months.  Marland Kitchen losartan (COZAAR) 100 MG tablet Take 1 tablet (100 mg total) by mouth every morning.  Marland Kitchen MAGNESIUM PO Take 500 mg by mouth daily.  . Multiple Vitamin (MULTIVITAMIN) capsule Take 1 capsule by mouth daily.    Marland Kitchen omeprazole (PRILOSEC) 20 MG capsule Take 1 capsule (20 mg total) by mouth every morning.  . Red Yeast Rice 600 MG CAPS Take 1 capsule by mouth every morning.   . tiotropium (SPIRIVA) 18 MCG inhalation capsule Place 1 capsule (18 mcg total) into inhaler and inhale daily.   No facility-administered encounter medications on file as of 03/31/2019.     Allergies  Allergen Reactions  . Guaifenesin Er Other (See Comments)    dizzy  . Roflumilast Palpitations    Heart race fast,  depressed    Review of Systems  Constitutional: Negative for activity change, appetite change, chills, diaphoresis, fatigue, fever and unexpected weight change.  HENT: Negative.  Negative for congestion.   Eyes: Negative.  Negative for photophobia and visual disturbance.  Respiratory: Positive for cough, shortness of breath (exertional, intermittent) and wheezing. Negative for chest tightness.   Cardiovascular: Negative for chest pain, palpitations and leg swelling.  Gastrointestinal: Negative for blood in stool, constipation, diarrhea, nausea and vomiting.  Endocrine: Negative.   Genitourinary: Negative for decreased urine volume, difficulty urinating, dysuria, frequency and urgency.  Musculoskeletal: Negative for arthralgias and myalgias.  Skin: Negative.   Allergic/Immunologic: Negative.   Neurological: Negative for dizziness, tremors, seizures, syncope, facial asymmetry, speech difficulty, weakness, light-headedness, numbness and headaches.  Hematological: Negative.   Psychiatric/Behavioral: Negative for confusion, hallucinations, sleep disturbance and suicidal ideas.  All other systems reviewed and are negative.        Observations/Objective: No vital signs or physical exam, this was a telephone or virtual health encounter.  Pt alert and oriented, answers all questions appropriately, and able to speak in full sentences.    Assessment and Plan: Summer was seen today for medical management of chronic issues, copd and hypertension.  Diagnoses and all orders for this visit:  COPD mixed type ([place]) Recent exacerbation treated with Doxycycline. States symptoms have completely resolved and he is feeling much better. Continue medications as prescribed. Follow up with pulmonology as scheduled.   Essential hypertension BP well controlled, 137/55 at home this morning. Changes were not made in regimen today. Goal BP is 130/80. Pt aware to report any persistent high or low readings. DASH  diet and exercise encouraged. Exercise at least 150 minutes per week and increase  as tolerated. Goal BMI > 25. Stress management encouraged. Avoid nicotine and tobacco product use. Avoid excessive alcohol and NSAID's. Avoid more than 2000 mg of sodium daily. Medications as prescribed. Follow up as scheduled.    Follow Up Instructions: Return in about 3 months (around 07/01/2019), or if symptoms worsen or fail to improve, for HTN, COPD, Insomnia.    I discussed the assessment and treatment plan with the patient. The patient was provided an opportunity to ask questions and all were answered. The patient agreed with the plan and demonstrated an understanding of the instructions.   The patient was advised to call back or seek an in-person evaluation if the symptoms worsen or if the condition fails to improve as anticipated.  The above assessment and management plan was discussed with the patient. The patient verbalized understanding of and has agreed to the management plan. Patient is aware to call the clinic if they develop any new symptoms or if symptoms persist or worsen. Patient is aware when to return to the clinic for a follow-up visit. Patient educated on when it is appropriate to go to the emergency department.    I provided 25 minutes of non-face-to-face time during this encounter. The call started at 0750. The call ended at 0815. The other time was used for coordination of care.    Monia Pouch, FNP-C [facility] [address] [place], [place] [address] [phone] 03/31/19

## 2019-03-31 NOTE — Telephone Encounter (Signed)
Pt advised of provider feedback and voiced understanding. 

## 2019-04-18 ENCOUNTER — Other Ambulatory Visit: Payer: Self-pay | Admitting: Family Medicine

## 2019-04-19 MED ORDER — HYDROCHLOROTHIAZIDE 25 MG PO TABS: 25.0000 mg | ORAL_TABLET | Freq: Every morning | ORAL | 1 refills | Status: DC

## 2019-04-24 ENCOUNTER — Other Ambulatory Visit: Payer: Self-pay | Admitting: Family Medicine

## 2019-04-24 MED ORDER — HYDROCHLOROTHIAZIDE 25 MG PO TABS: 25.0000 mg | ORAL_TABLET | Freq: Every morning | ORAL | 1 refills | Status: DC

## 2019-04-24 NOTE — Telephone Encounter (Signed)
Message left that rx sent to [facility].

## 2019-05-01 DIAGNOSIS — C61 Malignant neoplasm of prostate: Secondary | ICD-10-CM | POA: Diagnosis not present

## 2019-05-08 ENCOUNTER — Telehealth: Payer: Self-pay | Admitting: Family Medicine

## 2019-05-09 DIAGNOSIS — C61 Malignant neoplasm of prostate: Secondary | ICD-10-CM | POA: Diagnosis not present

## 2019-05-09 DIAGNOSIS — N3941 Urge incontinence: Secondary | ICD-10-CM | POA: Diagnosis not present

## 2019-05-09 NOTE — Telephone Encounter (Signed)
Aware.Has a quantity of 90 on refill.

## 2019-05-22 ENCOUNTER — Other Ambulatory Visit: Payer: Self-pay | Admitting: Family Medicine

## 2019-05-22 DIAGNOSIS — N401 Enlarged prostate with lower urinary tract symptoms: Secondary | ICD-10-CM

## 2019-05-22 DIAGNOSIS — K219 Gastro-esophageal reflux disease without esophagitis: Secondary | ICD-10-CM

## 2019-05-22 DIAGNOSIS — I10 Essential (primary) hypertension: Secondary | ICD-10-CM

## 2019-06-13 ENCOUNTER — Other Ambulatory Visit: Payer: Self-pay

## 2019-06-13 ENCOUNTER — Ambulatory Visit (INDEPENDENT_AMBULATORY_CARE_PROVIDER_SITE_OTHER): Payer: Medicare HMO | Admitting: Family Medicine

## 2019-06-13 ENCOUNTER — Encounter: Payer: Self-pay | Admitting: Family Medicine

## 2019-06-13 VITALS — BP 151/75 | HR 95 | Temp 98.7°F | Resp 20 | Ht 70.0 in | Wt 189.0 lb

## 2019-06-13 DIAGNOSIS — J449 Chronic obstructive pulmonary disease, unspecified: Secondary | ICD-10-CM | POA: Diagnosis not present

## 2019-06-13 DIAGNOSIS — Z79899 Other long term (current) drug therapy: Secondary | ICD-10-CM

## 2019-06-13 DIAGNOSIS — I10 Essential (primary) hypertension: Secondary | ICD-10-CM | POA: Diagnosis not present

## 2019-06-13 DIAGNOSIS — E559 Vitamin D deficiency, unspecified: Secondary | ICD-10-CM | POA: Diagnosis not present

## 2019-06-13 DIAGNOSIS — E785 Hyperlipidemia, unspecified: Secondary | ICD-10-CM | POA: Diagnosis not present

## 2019-06-13 DIAGNOSIS — F5101 Primary insomnia: Secondary | ICD-10-CM

## 2019-06-13 MED ORDER — HYDROCHLOROTHIAZIDE 25 MG PO TABS: 25.0000 mg | ORAL_TABLET | Freq: Every morning | ORAL | 3 refills | Status: DC

## 2019-06-13 MED ORDER — ERGOCALCIFEROL 1.25 MG (50000 UT) PO CAPS: 50000.0000 [IU] | ORAL_CAPSULE | ORAL | 5 refills | Status: DC

## 2019-06-13 MED ORDER — MELATONIN 12 MG PO TABS: 1.0000 | ORAL_TABLET | Freq: Every day | ORAL | 3 refills | Status: AC

## 2019-06-13 MED ORDER — CLONAZEPAM 2 MG PO TABS: 1.0000 mg | ORAL_TABLET | Freq: Every evening | ORAL | 3 refills | Status: DC | PRN

## 2019-06-13 NOTE — Patient Instructions (Signed)

## 2019-06-13 NOTE — Progress Notes (Signed)
Subjective:  Patient ID: [name], male    DOB: April 28, 1936, 83 y.o.   MRN: [id number]  Patient Care Team: Baruch Gouty, FNP as PCP - General (Family Medicine) Danie Binder, MD (Gastroenterology)   Chief Complaint:  Medical Management of Chronic Issues, COPD, and Hypertension   HPI: [name] is a 83 y.o. male presenting on 06/13/2019 for Medical Management of Chronic Issues, COPD, and Hypertension   1. COPD mixed type ([place]) Well controlled on Symbicort and as needed SABA. No increased shortness of breath, sputum production, or cough. No fever, chills, weakness, or confusion.   2. Essential hypertension Complaint with meds - Yes Current Medications - cozaar Checking BP at home - no Exercising Regularly - No Watching Salt intake - No Pertinent ROS:  Headache - No Fatigue - yes Visual Disturbances - No Chest pain - No Dyspnea - No Palpitations - No LE edema - No They report good compliance with medications and can restate their regimen by memory. No medication side effects.  Family, social, and smoking history reviewed.   BP Readings from Last 3 Encounters:  06/13/19 (!) 151/75  02/24/19 (!) 152/72  12/29/18 133/64   CMP Latest Ref Rng & Units 12/29/2018 04/29/2018 12/29/2013  Glucose 65 - 99 mg/dL 93 92 97  BUN 8 - 27 mg/dL 22 - -  Creatinine 0.76 - 1.27 mg/dL 1.02 - -  Sodium 134 - 144 mmol/L 141 143 140  Potassium 3.5 - 5.2 mmol/L 4.0 4.0 3.8  Chloride 96 - 106 mmol/L 100 - -  CO2 20 - 29 mmol/L 24 - -  Calcium 8.6 - 10.2 mg/dL 9.8 - -  Total Protein 6.0 - 8.5 g/dL 6.1 - -  Total Bilirubin 0.0 - 1.2 mg/dL 0.4 - -  Alkaline Phos 39 - 117 IU/L 75 - -  AST 0 - 40 IU/L 18 - -  ALT 0 - 44 IU/L 12 - -      3. Dyslipidemia Compliant with medications - Yes Current medications - red yeast rice Side effects from medications - No Diet - does not watch Exercise - does not exercise  Lab Results  Component Value Date   CHOL 166 12/29/2018   HDL 52  12/29/2018   [place] 95 12/29/2018   TRIG 95 12/29/2018   CHOLHDL 3.2 12/29/2018     Family and personal medical history reviewed. Smoking and ETOH history reviewed.    4. Vitamin D deficiency Pt is taking oral repletion therapy. Denies bone pain and tenderness, muscle weakness, fracture, and difficulty walking. Lab Results  Component Value Date   VD25OH 72.0 12/29/2018   Lab Results  Component Value Date   CALCIUM 9.8 12/29/2018      5. Primary insomnia Ongoing and worsening insomnia per pts report. Pt has been on Klonopin for over 15 years. States his previous PCP decreased the dose to 1 mg per night vs 2 mg per night that he was taking. He states he has had a lack of sleep over the last 3 days and feels he needs to increase to 2 mg again. Pt does watch TV until time for bed. No nightly routine to prepare for bed. Denies excessive caffeine in the afternoons. States he goes to bed and then wakes around 3 hours later and has a difficult time going back to sleep. He has tried benadryl without relief of symptoms.   Relevant past medical, surgical, family, and social history reviewed and updated as  indicated.  Allergies and medications reviewed and updated. Date reviewed: Chart in Epic.   Past Medical History:  Diagnosis Date  . Arthritis    neck, back, feet and ankles, feet and ankles swell due to arthritis, prop feet up swelling goes down  . Bronchiectasis without complication ([place])    bronchitis oct 2019 got clearance for surgery  . COPD (chronic obstructive pulmonary disease) with chronic bronchitis ([place])    pulmologist-  dr Tarri Fuller young  . Dyspnea    with heavy exertion  . GERD (gastroesophageal reflux disease)   . History of colon polyps    2007--  simple adenoma  . History of gastritis    SECONDARY TO ADVIL  . History of skin cancer    EXCISION LEFT SIDE OF FACE and neck  . Hypertension   . Insomnia   . Nocturia   . Prostate cancer [facility])    dx  2009--  XRT  takine lupron shots for last shot oct 2019    Past Surgical History:  Procedure Laterality Date  . CATARACT EXTRACTION W/ INTRAOCULAR LENS  IMPLANT, [place]  . COLONOSCOPY  JUL 2007 D25 V1   Simple adenomas(RECTUM), HYPERPLASTIC COLON POLYPS, [place] TICS  . COLONOSCOPY  03/18/2011   Procedure: COLONOSCOPY;  Surgeon: Dorothyann Peng, MD;  [facility];  Service: Endoscopy;  Laterality: N/A;  8:30  . CRYOABLATION N/A 12/29/2013   Procedure: CRYO ABLATION PROSTATE;  Surgeon: Arvil Persons, MD;  Location: [facility];  Service: Urology;  Laterality: N/A;  . CYSTOSCOPY N/A 04/29/2018   Procedure: Erlene Quan;  Surgeon: Festus Aloe, MD;  Location: [facility];  Service: Urology;  Laterality: N/A;  . skin cancers removed  2018   left arm  . UPPER GASTROINTESTINAL ENDOSCOPY  2007 DYSPEPSIA D50 V4   NSAID GASTRITIS    Social History   Socioeconomic History  . Marital status: Divorced    Spouse name: Not on file  . Number of children: Not on file  . Years of education: Not on file  . Highest education level: Not on file  Occupational History  . Not on file  Tobacco Use  . Smoking status: Former Smoker    Packs/day: 1.50    Years: 30.00    Pack years: 45.00    Types: Cigarettes    Quit date: 06/23/1979    Years since quitting: 40.0  . Smokeless tobacco: Never Used  Substance and Sexual Activity  . Alcohol use: Yes    Comment: occ whiskey helps with bronchitis  . Drug use: No  . Sexual activity: Not on file  Other Topics Concern  . Not on file  Social History Narrative  . Not on file   Social Determinants of Health   Financial Resource Strain:   . Difficulty of Paying Living Expenses: Not on file  Food Insecurity:   . Worried About Charity fundraiser in the Last Year: Not on file  . Ran Out of Food in the Last Year: Not on file  Transportation Needs:   . Lack of Transportation (Medical): Not on file  . Lack of  Transportation (Non-Medical): Not on file  Physical Activity:   . Days of Exercise per Week: Not on file  . Minutes of Exercise per Session: Not on file  Stress:   . Feeling of Stress : Not on file  Social Connections:   . Frequency of Communication with Friends and Family:  Not on file  . Frequency of Social Gatherings with Friends and Family: Not on file  . Attends Religious Services: Not on file  . Active Member of Clubs or Organizations: Not on file  . Attends Archivist Meetings: Not on file  . Marital Status: Not on file  Intimate Partner Violence:   . Fear of Current or Ex-Partner: Not on file  . Emotionally Abused: Not on file  . Physically Abused: Not on file  . Sexually Abused: Not on file    Outpatient Encounter Medications as of 06/13/2019  Medication Sig  . albuterol (PROVENTIL HFA;VENTOLIN HFA) 108 (90 BASE) MCG/ACT inhaler Inhale 2 puffs into the lungs every 6 (six) hours as needed.    . Alendronate Sodium (BINOSTO) 70 MG TBEF Take 70 mg by mouth once a week. Takes on wednesdays  . aspirin 81 MG tablet Take 81 mg by mouth daily.    . budesonide-formoterol (SYMBICORT) 160-4.5 MCG/ACT inhaler Inhale 2 puffs into the lungs 2 (two) times daily.  . clonazePAM (KLONOPIN) 2 MG tablet Take 0.5 tablets (1 mg total) by mouth at bedtime as needed.  . doxazosin (CARDURA) 4 MG tablet TAKE 1 TABLET (4 MG TOTAL) BY MOUTH AT BEDTIME.  . ergocalciferol (VITAMIN D2) 1.25 MG (50000 UT) capsule Take 1 capsule (50,000 Units total) by mouth once a week. Takes on monday  . hydrochlorothiazide (HYDRODIURIL) 25 MG tablet Take 1 tablet (25 mg total) by mouth every morning.  Marland Kitchen leuprolide (LUPRON) 22.5 MG injection Inject 22.5 mg into the muscle every 3 (three) months.  Marland Kitchen losartan (COZAAR) 100 MG tablet TAKE 1 TABLET (100 MG TOTAL) BY MOUTH EVERY MORNING.  . MAGNESIUM PO Take 500 mg by mouth daily.  . Multiple Vitamin (MULTIVITAMIN) capsule Take 1 capsule by mouth daily.    Marland Kitchen omeprazole  (PRILOSEC) 20 MG capsule TAKE 1 CAPSULE (20 MG TOTAL) BY MOUTH EVERY MORNING.  . Red Yeast Rice 600 MG CAPS Take 1 capsule by mouth every morning.   . tiotropium (SPIRIVA) 18 MCG inhalation capsule Place 1 capsule (18 mcg total) into inhaler and inhale daily.  . [DISCONTINUED] clonazePAM (KLONOPIN) 2 MG tablet Take 1 mg by mouth at bedtime as needed.  . [DISCONTINUED] ergocalciferol (VITAMIN D2) 50000 UNITS capsule Take 50,000 Units by mouth once a week. Takes on monday  . [DISCONTINUED] hydrochlorothiazide (HYDRODIURIL) 25 MG tablet Take 1 tablet (25 mg total) by mouth every morning. Takes 12.5 (Patient taking differently: Take 25 mg by mouth every morning. )  . Melatonin 12 MG TABS Take 1 tablet by mouth at bedtime.  . [DISCONTINUED] doxycycline (VIBRA-TABS) 100 MG tablet Take 1 tablet (100 mg total) by mouth 2 (two) times daily.   No facility-administered encounter medications on file as of 06/13/2019.    Allergies  Allergen Reactions  . Guaifenesin Er Other (See Comments)    dizzy  . Roflumilast Palpitations    Heart race fast, depressed    Review of Systems  Constitutional: Positive for fatigue. Negative for activity change, appetite change, chills, diaphoresis, fever and unexpected weight change.  HENT: Negative.   Eyes: Negative.  Negative for photophobia and visual disturbance.  Respiratory: Positive for cough and shortness of breath. Negative for chest tightness and wheezing.   Cardiovascular: Negative for chest pain, palpitations and leg swelling.  Gastrointestinal: Negative for abdominal pain, blood in stool, constipation, diarrhea, nausea and vomiting.  Endocrine: Negative.  Negative for cold intolerance, heat intolerance, polydipsia, polyphagia and polyuria.  Genitourinary: Negative for  decreased urine volume, difficulty urinating, dysuria, frequency and urgency.  Musculoskeletal: Positive for gait problem (uses cane). Negative for arthralgias and myalgias.  Skin: Negative.    Allergic/Immunologic: Negative.   Neurological: Negative for dizziness, tremors, seizures, syncope, facial asymmetry, speech difficulty, weakness, light-headedness, numbness and headaches.  Hematological: Negative.   Psychiatric/Behavioral: Positive for sleep disturbance. Negative for confusion, hallucinations and suicidal ideas.  All other systems reviewed and are negative.       Objective:  BP (!) 151/75   Pulse 95   Temp 98.7 F (37.1 C)   Resp 20   Ht '5\' 10"'  (1.778 m)   Wt 189 lb (85.7 kg)   SpO2 95%   BMI 27.12 kg/m    Wt Readings from Last 3 Encounters:  06/13/19 189 lb (85.7 kg)  02/24/19 189 lb 6.4 oz (85.9 kg)  12/29/18 189 lb (85.7 kg)    Physical Exam Vitals and nursing note reviewed.  Constitutional:      General: He is not in acute distress.    Appearance: Normal appearance. He is well-developed, well-groomed and overweight. He is not ill-appearing, toxic-appearing or diaphoretic.  HENT:     Head: Normocephalic and atraumatic.     Jaw: There is normal jaw occlusion.     Right Ear: Hearing normal.     Left Ear: Hearing normal.     Nose: Nose normal.     Mouth/Throat:     Lips: Pink.     Mouth: Mucous membranes are moist.     Pharynx: Oropharynx is clear. Uvula midline.  Eyes:     General: Lids are normal.     Extraocular Movements: Extraocular movements intact.     Conjunctiva/sclera: Conjunctivae normal.     Pupils: Pupils are equal, round, and reactive to light.  Neck:     Thyroid: No thyroid mass, thyromegaly or thyroid tenderness.     Vascular: No carotid bruit or JVD.     Trachea: Trachea and phonation normal.  Cardiovascular:     Rate and Rhythm: Normal rate and regular rhythm.     Chest Wall: PMI is not displaced.     Pulses: Normal pulses.     Heart sounds: Normal heart sounds. No murmur. No friction rub. No gallop.   Pulmonary:     Effort: Pulmonary effort is normal. No respiratory distress.     Breath sounds: Examination of the  right-lower field reveals rhonchi. Examination of the left-lower field reveals rhonchi. Rhonchi (clears with cough) present. No wheezing.  Abdominal:     General: Bowel sounds are normal. There is no distension or abdominal bruit.     Palpations: Abdomen is soft. There is no hepatomegaly or splenomegaly.     Tenderness: There is no abdominal tenderness. There is no right CVA tenderness or left CVA tenderness.     Hernia: No hernia is present.  Musculoskeletal:        General: Normal range of motion.     Cervical back: Normal range of motion and neck supple.     Right lower leg: No edema.     Left lower leg: No edema.  Lymphadenopathy:     Cervical: No cervical adenopathy.  Skin:    General: Skin is warm and dry.     Capillary Refill: Capillary refill takes less than 2 seconds.     Coloration: Skin is not cyanotic, jaundiced or pale.     Findings: No rash.  Neurological:     General: No focal deficit present.  Mental Status: He is alert and oriented to person, place, and time.     Cranial Nerves: Cranial nerves are intact. No cranial nerve deficit.     Sensory: Sensation is intact. No sensory deficit.     Motor: Motor function is intact. No weakness.     Coordination: Coordination is intact. Coordination normal.     Gait: Gait abnormal (uses cane).     Deep Tendon Reflexes: Reflexes are normal and symmetric. Reflexes normal.  Psychiatric:        Attention and Perception: Attention and perception normal.        Mood and Affect: Mood and affect normal.        Speech: Speech normal.        Behavior: Behavior normal. Behavior is cooperative.        Thought Content: Thought content normal.        Cognition and Memory: Cognition and memory normal.        Judgment: Judgment normal.     Results for orders placed or performed in visit on 02/24/19  Respiratory or Resp and Sputum Culture   Specimen: Sputum  Result Value Ref Range   MICRO NUMBER: [id number]    SPECIMEN QUALITY:  Adequate    Source SPUTUM    STATUS: FINAL    GRAM STAIN: (A)     Moderate White blood cells seen Few epithelial cells Few Gram positive cocci in pairs Few Gram negative bacilli Few Gram negative diplococci   ISOLATE 1: Moraxella catarrhalis (A)        Pertinent labs & imaging results that were available during my care of the patient were reviewed by me and considered in my medical decision making.  Assessment & Plan:  Kelso was seen today for medical management of chronic issues, copd and hypertension.  Diagnoses and all orders for this visit:  COPD mixed type ([place]) Well controlled with current regimen. Will continue.   Essential hypertension BP fairly controlled. Changes were not made in regimen today. Goal BP is 140/90. Pt aware to report any persistent high or low readings. DASH diet and exercise encouraged. Exercise at least 150 minutes per week and increase as tolerated. Goal BMI > 25. Stress management encouraged. Avoid nicotine and tobacco product use. Avoid excessive alcohol and NSAID's. Avoid more than 2000 mg of sodium daily. Medications as prescribed. Follow up as scheduled.  -     hydrochlorothiazide (HYDRODIURIL) 25 MG tablet; Take 1 tablet (25 mg total) by mouth every morning. -     CMP14+EGFR -     CBC with Differential/Platelet -     Lipid panel -     Thyroid Panel With TSH  Dyslipidemia Diet encouraged - increase intake of fresh fruits and vegetables, increase intake of lean proteins. Bake, broil, or grill foods. Avoid fried, greasy, and fatty foods. Avoid fast foods. Increase intake of fiber-rich whole grains. Exercise encouraged - at least 150 minutes per week and advance as tolerated.  Goal BMI < 25. Continue medications as prescribed. Follow up in 3-6 months as discussed.  -     Lipid panel  Vitamin D deficiency Labs pending. Continue repletion therapy. If indicated, will change repletion dosage. Eat foods rich in Vit D including milk, orange juice, yogurt with  vitamin D added, salmon or mackerel, canned tuna fish, cereals with vitamin D added, and cod liver oil. Get out in the sun but make sure to wear at least SPF 30 sunscreen.  -  ergocalciferol (VITAMIN D2) 1.25 MG (50000 UT) capsule; Take 1 capsule (50,000 Units total) by mouth once a week. Takes on monday  Primary insomnia Sleep hygiene discussed in detail. Pt persistent upon increasing clonzzepam to 2 mg nightly. Pt educated on safety of medications with aging. Pt aware will add on melatonin along with sleep hygiene to see if beneficial. Pt aware to continue 1 mg per night. Controlled substance contract signed and toxassure collected.  -     clonazePAM (KLONOPIN) 2 MG tablet; Take 0.5 tablets (1 mg total) by mouth at bedtime as needed. -     Melatonin 12 MG TABS; Take 1 tablet by mouth at bedtime. -     DRUG SCREEN-TOXASSURE  Controlled substance agreement signed -     clonazePAM (KLONOPIN) 2 MG tablet; Take 0.5 tablets (1 mg total) by mouth at bedtime as needed. -     DRUG SCREEN-TOXASSURE     Continue all other maintenance medications.  Follow up plan: Return in about 3 months (around 09/11/2019), or if symptoms worsen or fail to improve.  Continue healthy lifestyle choices, including diet (rich in fruits, vegetables, and lean proteins, and low in salt and simple carbohydrates) and exercise (at least 30 minutes of moderate physical activity daily).  Educational handout given for insomnia  The above assessment and management plan was discussed with the patient. The patient verbalized understanding of and has agreed to the management plan. Patient is aware to call the clinic if they develop any new symptoms or if symptoms persist or worsen. Patient is aware when to return to the clinic for a follow-up visit. Patient educated on when it is appropriate to go to the emergency department.   Monia Pouch, FNP-C [facility] [phone]

## 2019-06-14 LAB — CMP14+EGFR
ALT: 17 IU/L (ref 0–44)
AST: 21 IU/L (ref 0–40)
Albumin/Globulin Ratio: 2.4 — ABNORMAL HIGH (ref 1.2–2.2)
Albumin: 4.4 g/dL (ref 3.6–4.6)
Alkaline Phosphatase: 74 IU/L (ref 39–117)
BUN/Creatinine Ratio: 27 — ABNORMAL HIGH (ref 10–24)
BUN: 23 mg/dL (ref 8–27)
Bilirubin Total: 0.4 mg/dL (ref 0.0–1.2)
CO2: 26 mmol/L (ref 20–29)
Calcium: 10.1 mg/dL (ref 8.6–10.2)
Chloride: 101 mmol/L (ref 96–106)
Creatinine, Ser: 0.86 mg/dL (ref 0.76–1.27)
GFR calc Af Amer: 94 mL/min/{1.73_m2} (ref >59)
GFR calc non Af Amer: 81 mL/min/{1.73_m2} (ref >59)
Globulin, Total: 1.8 g/dL (ref 1.5–4.5)
Glucose: 97 mg/dL (ref 65–99)
Potassium: 4.1 mmol/L (ref 3.5–5.2)
Sodium: 141 mmol/L (ref 134–144)
Total Protein: 6.2 g/dL (ref 6.0–8.5)

## 2019-06-14 LAB — CBC WITH DIFFERENTIAL/PLATELET
Basophils Absolute: 0.1 10*3/uL (ref 0.0–0.2)
Basos: 1 %
EOS (ABSOLUTE): 0.3 10*3/uL (ref 0.0–0.4)
Eos: 4 %
Hematocrit: 42.9 % (ref 37.5–51.0)
Hemoglobin: 15 g/dL (ref 13.0–17.7)
Immature Grans (Abs): 0 10*3/uL (ref 0.0–0.1)
Immature Granulocytes: 0 %
Lymphocytes Absolute: 1.4 10*3/uL (ref 0.7–3.1)
Lymphs: 17 %
MCH: 32.5 pg (ref 26.6–33.0)
MCHC: 35 g/dL (ref 31.5–35.7)
MCV: 93 fL (ref 79–97)
Monocytes Absolute: 0.6 10*3/uL (ref 0.1–0.9)
Monocytes: 8 %
Neutrophils Absolute: 5.7 10*3/uL (ref 1.4–7.0)
Neutrophils: 70 %
Platelets: 180 10*3/uL (ref 150–450)
RBC: 4.61 x10E6/uL (ref 4.14–5.80)
RDW: 12 % (ref 11.6–15.4)
WBC: 8 10*3/uL (ref 3.4–10.8)

## 2019-06-14 LAB — THYROID PANEL WITH TSH
Free Thyroxine Index: 2.4 (ref 1.2–4.9)
T3 Uptake Ratio: 29 % (ref 24–39)
T4, Total: 8.4 ug/dL (ref 4.5–12.0)
TSH: 3.67 u[IU]/mL (ref 0.450–4.500)

## 2019-06-14 LAB — LIPID PANEL
Chol/HDL Ratio: 3.7 ratio (ref 0.0–5.0)
Cholesterol, Total: 208 mg/dL — ABNORMAL HIGH (ref 100–199)
HDL: 56 mg/dL (ref >39)
LDL Chol Calc (NIH): 125 mg/dL — ABNORMAL HIGH (ref 0–99)
Triglycerides: 155 mg/dL — ABNORMAL HIGH (ref 0–149)
VLDL Cholesterol Cal: 27 mg/dL (ref 5–40)

## 2019-06-14 NOTE — Progress Notes (Signed)
Blood sugar normal. Liver and kidney functions normal.  CBC normal. Thyroid function normal. Cholesterol high at 208, LDL high at 125, and triglycerides high at 155. Limit intake of fried, greasy, fatty, and fast foods. Make dure you are taking 2400 mg of red yeast rice daily and omega 3 fish oil twice daily with meals. Will recheck in 6-9 months.

## 2019-06-16 LAB — TOXASSURE SELECT 13 (MW), URINE

## 2019-06-28 ENCOUNTER — Ambulatory Visit: Payer: Medicare HMO | Admitting: Internal Medicine

## 2019-06-28 ENCOUNTER — Telehealth: Payer: Self-pay | Admitting: Internal Medicine

## 2019-06-28 ENCOUNTER — Other Ambulatory Visit: Payer: Self-pay

## 2019-06-28 ENCOUNTER — Encounter: Payer: Self-pay | Admitting: Internal Medicine

## 2019-06-28 DIAGNOSIS — F5101 Primary insomnia: Secondary | ICD-10-CM

## 2019-06-28 DIAGNOSIS — J449 Chronic obstructive pulmonary disease, unspecified: Secondary | ICD-10-CM

## 2019-06-28 NOTE — Assessment & Plan Note (Signed)
Bronchiectasis with only mild changes on CXR. Currently under better control. Discussed meds, Covid vaccine.  Plan- continue current meds through [place].

## 2019-06-28 NOTE — Assessment & Plan Note (Signed)
Doing fairly well most nights with clonazepam, melatonin. Goals and habits discussed.

## 2019-06-28 NOTE — Telephone Encounter (Signed)
Spoke with pt, states that he was taking Diclofenac but it made his throat swell up.  Pt has stopped taking this and feels better.  Pt wanted to let CY know.   I have updated pt's allergy list to reflect this.    Forwarding to CY as FYI.

## 2019-06-28 NOTE — Progress Notes (Signed)
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  Walked 3 laps = 555 feet. Office Spirometry 04/14/2018-moderately severe obstructive airways disease, FVC 2.3/58%, FEV1 1.5/53%, ratio 0.66, FEF 25-75% 0.9/45% -------------------------------------------------------------------------------------------------------------  02/24/2019- 84 year old male [place] former smoker followed for chronic bronchitis/bronchiectasis COPD, insomnia, complicated by HBP, chronic gastritis, GERD, atherosclerosis, prostate cancer Spiriva HandiHaler, Symbicort 160, albuterol HFA, -----pt states his breathing has been worse since LOV, reports DOE, congestion, cough w/ light or dark yellowish green mucus Arrival sat 92% room air Chronic cough usually productive at some time during day. Currently darker. No fever. Uses pneumatic Vest about 1x/ week- doesn't think it is much help.  06/28/19- 84 year old male [place] former smoker followed for chronic bronchitis/bronchiectasis COPD, insomnia, complicated by HBP, chronic gastritis, GERD, atherosclerosis, prostate cancer Spiriva HandiHaler, Symbicort 160, albuterol HFA, -----f/u Bronchiectasis/ COPD Breathing some better since LOV. He was having to travel frequently between a hot [place] home and his own cooler home and he thinks temperature changes bothered him.  Not using Spiriva. Continues Symbicort and occ rescue albuterol. [facility]. Added melatonin for sleep with clonazepam- seems to help insomnia.  CXR 02/24/2019-  IMPRESSION: 1. Low lung volumes with  mild bibasilar scarring, but no radiographic evidence of acute cardiopulmonary disease. 2. Aortic atherosclerosis.  ROS-See HPI  + = positive Constitutional:   No-   weight loss, night sweats, fevers, chills, fatigue, +lassitude. HEENT:   No-  headaches, difficulty swallowing, tooth/dental problems, sore throat,       No-  sneezing, itching, ear ache,     no current- nasal congestion, post nasal drip,  CV:  No-chest pain, orthopnea, PND, swelling in lower extremities, anasarca, dizziness, palpitations Resp: + shortness of breath with exertion or at rest.                +productive cough,  + non-productive cough,  No- coughing up of blood.              -change in color of mucus.  No- wheezing.   Skin: No-   rash or lesions. GI: + heartburn, indigestion,  No-abdominal pain, nausea, vomiting,  GU:  MS:  No-   joint pain or swelling.    + back pain. Neuro-     nothing unusual Psych:  No- change in mood or affect. No depression or anxiety.  No memory loss.  OBJ General- Alert, Oriented, Affect-appropriate, Distress- none acute; +overweight Skin- rash-none, lesions- none, excoriation- none Lymphadenopathy- none Head- atraumatic            Eyes- Gross vision intact, PERRLA, conjunctivae clear secretions            Ears- Hearing, canals-normal            Nose- Clear, no-Septal dev, mucus, polyps, erosion, perforation             Throat- Mallampati III , mucosa cobblestoned , drainage- none, tonsils- atrophic Neck- flexible , trachea midline, no stridor , thyroid nl, carotid no bruit Chest - symmetrical excursion , unlabored           Heart/CV- RRR , no murmur , no gallop  , no rub, nl s1 s2                           -  JVD- none , edema- none, stasis changes- none, varices- none           Lung-+ clear,+ slow exhalation, Unlabored , dullness-none, rub- none,            Chest wall-  Abd-  Br/ Gen/ Rectal- Not done, not indicated Extrem- cyanosis- none, clubbing, none, atrophy- none,  strength- nl Neuro- grossly intact to observation

## 2019-06-28 NOTE — Patient Instructions (Signed)
Glad you are doing better and I hope this next year is good for you.  Please call if we can help

## 2019-08-30 ENCOUNTER — Telehealth: Payer: Self-pay | Admitting: Family Medicine

## 2019-08-30 NOTE — Chronic Care Management (AMB) (Signed)
  Chronic Care Management   Outreach Note  08/30/2019 Name: MYKING JINDRA MRN: [id number] DOB: [birth year]  MELBERN CEFALO is a 84 y.o. year old male who is a primary care patient of Rakes, Connye Burkitt, FNP. I reached out to Ernestine Conrad by phone today in response to a referral sent by Mr. Arhaan Macewan Dzikowski's health plan.     An unsuccessful telephone outreach was attempted today. The patient was referred to the case management team for assistance with care management and care coordination.   Follow Up Plan: A HIPPA compliant phone message was left for the patient providing contact information and requesting a return call. The care management team will reach out to the patient again over the next 7 days.  If patient returns call to provider office, please advise to call [place] at [phone].   [place], [place] [address] Direct Dial: [phone] [email or web address]

## 2019-08-31 NOTE — Chronic Care Management (AMB) (Signed)
  Chronic Care Management   Note  08/31/2019 Name: TIARA BARTOLI MRN: [id number] DOB: 03-03-1936  JERYN CERNEY is a 84 y.o. year old male who is a primary care patient of Rakes, Connye Burkitt, FNP. I reached out to Ernestine Conrad by phone today in response to a referral sent by Mr. Giovanne Nickolson [place]'s health plan.     Mr. Dino was given information about Chronic Care Management services today including:  1. CCM service includes personalized support from designated clinical staff supervised by his physician, including individualized plan of care and coordination with other care providers 2. 24/7 contact phone numbers for assistance for urgent and routine care needs. 3. Service will only be billed when office clinical staff spend 20 minutes or more in a month to coordinate care. 4. Only one practitioner may furnish and bill the service in a calendar month. 5. The patient may stop CCM services at any time (effective at the end of the month) by phone call to the office staff. 6. The patient will be responsible for cost sharing (co-pay) of up to 20% of the service fee (after annual deductible is met).  Patient did not agree to enrollment in care management services and does not wish to consider at this time.  Follow up plan: The patient has been provided with contact information for the care management team and has been advised to call with any health related questions or concerns.   [place], [place] [address] Direct Dial: [phone] [email or web address]: [email or web address]

## 2019-08-31 NOTE — Chronic Care Management (AMB) (Signed)
  Chronic Care Management   Outreach Note  08/31/2019 Name: NOVIAN SCHUENEMAN MRN: [id number] DOB: April 19, 1936  SHMUEL KEDROWSKI is a 84 y.o. year old male who is a primary care patient of Rakes, Connye Burkitt, FNP. I reached out to Ernestine Conrad by phone today in response to a referral sent by Mr. Tharun Puck [place]'s health plan.     A second unsuccessful telephone outreach was attempted today after patient called back and left voicemail to call back. The patient was referred to the case management team for assistance with care management and care coordination.   Follow Up Plan: A HIPPA compliant phone message was left for the patient providing contact information and requesting a return call. The care management team will reach out to the patient again over the next 7 days.  If patient returns call to provider office, please advise to call [place] at [phone].  [place], [place] [address] Direct Dial: [phone] [email or web address]: [email or web address]

## 2019-09-11 ENCOUNTER — Other Ambulatory Visit: Payer: Self-pay

## 2019-09-11 ENCOUNTER — Encounter: Payer: Self-pay | Admitting: Family Medicine

## 2019-09-11 ENCOUNTER — Ambulatory Visit (INDEPENDENT_AMBULATORY_CARE_PROVIDER_SITE_OTHER): Payer: Medicare HMO | Admitting: Family Medicine

## 2019-09-11 VITALS — BP 112/54 | HR 90 | Temp 98.7°F | Resp 20 | Ht 70.0 in | Wt 193.0 lb

## 2019-09-11 DIAGNOSIS — F5101 Primary insomnia: Secondary | ICD-10-CM | POA: Diagnosis not present

## 2019-09-11 DIAGNOSIS — I10 Essential (primary) hypertension: Secondary | ICD-10-CM

## 2019-09-11 DIAGNOSIS — M8949 Other hypertrophic osteoarthropathy, multiple sites: Secondary | ICD-10-CM | POA: Diagnosis not present

## 2019-09-11 DIAGNOSIS — E785 Hyperlipidemia, unspecified: Secondary | ICD-10-CM | POA: Diagnosis not present

## 2019-09-11 DIAGNOSIS — J449 Chronic obstructive pulmonary disease, unspecified: Secondary | ICD-10-CM

## 2019-09-11 DIAGNOSIS — M159 Polyosteoarthritis, unspecified: Secondary | ICD-10-CM

## 2019-09-11 MED ORDER — CLONAZEPAM 2 MG PO TABS: 1.0000 mg | ORAL_TABLET | Freq: Every evening | ORAL | 3 refills | Status: DC | PRN

## 2019-09-11 NOTE — Progress Notes (Signed)
Subjective:  Patient ID: [name], male    DOB: Nov 01, 1935, 84 y.o.   MRN: [id number]  Patient Care Team: Loman Brooklyn, FNP as PCP - General (Family Medicine) Danie Binder, MD (Gastroenterology)   Chief Complaint:  Medical Management of Chronic Issues (3 mo ), COPD, and Hypertension   HPI: [name] is a 84 y.o. male presenting on 09/11/2019 for Medical Management of Chronic Issues (3 mo ), COPD, and Hypertension  1. COPD mixed type ([place]) Pt reports fair control of symptoms. States he does have exertional shortness of breath and fatigue. He is using his medications as prescribed. States he has worsening symptoms during season changes but states he does not have increased resting shortness of breath or sputum production.   2. Essential hypertension Pt reports great control of BP with current regimen. Does try to watch diet but does not exercise due to COPD and osteoarthritis of multiple joints. No chest pain, leg swelling, or headaches. No weakness, palpitations, or syncope.   3. Dyslipidemia Has been taking Omega-3 fatty acids. States he is taking that instead of the red yeast rice. States he hears this worked better so he switched. States he has been taking this for a few weeks. No associated side effects.   4. Primary osteoarthritis involving multiple joints Ongoing and worsening pain and stiffness in bilateral ankles, knees, lower back, and bilateral wrists. States he takes tylenol with some relief of symptoms. No recent falls or new injuries. Uses cane but is having a hard time with ambulation due to stiffness in his ankles.   5. Primary insomnia Doing well on Klonopin. States he takes 0.5-1 mg nightly as prescribed and does ok with this. States he does have some restless nights despite medications.      Relevant past medical, surgical, family, and social history reviewed and updated as indicated.  Allergies and medications reviewed and updated. Date reviewed:  Chart in Epic.   Past Medical History:  Diagnosis Date  . Arthritis    neck, back, feet and ankles, feet and ankles swell due to arthritis, prop feet up swelling goes down  . Bronchiectasis without complication ([place])    bronchitis oct 2019 got clearance for surgery  . COPD (chronic obstructive pulmonary disease) with chronic bronchitis ([place])    pulmologist-  dr Tarri Fuller young  . Dyspnea    with heavy exertion  . GERD (gastroesophageal reflux disease)   . History of colon polyps    2007--  simple adenoma  . History of gastritis    SECONDARY TO ADVIL  . History of skin cancer    EXCISION LEFT SIDE OF FACE and neck  . Hypertension   . Insomnia   . Nocturia   . Prostate cancer [facility])    dx  2009--  XRT takine lupron shots for last shot oct 2019    Past Surgical History:  Procedure Laterality Date  . CATARACT EXTRACTION W/ INTRAOCULAR LENS  IMPLANT, [place]  . COLONOSCOPY  JUL 2007 D25 V1   Simple adenomas(RECTUM), HYPERPLASTIC COLON POLYPS, [facility]  . COLONOSCOPY  03/18/2011   Procedure: COLONOSCOPY;  Surgeon: Dorothyann Peng, MD;  [facility];  Service: Endoscopy;  Laterality: N/A;  8:30  . CRYOABLATION N/A 12/29/2013   Procedure: CRYO ABLATION PROSTATE;  Surgeon: Arvil Persons, MD;  Location: [facility];  Service: Urology;  Laterality: N/A;  . CYSTOSCOPY N/A 04/29/2018  Procedure: Erlene Quan;  Surgeon: Festus Aloe, MD;  Location: [facility];  Service: Urology;  Laterality: N/A;  . skin cancers removed  2018   left arm  . UPPER GASTROINTESTINAL ENDOSCOPY  2007 DYSPEPSIA D50 V4   NSAID GASTRITIS    Social History   Socioeconomic History  . Marital status: Divorced    Spouse name: Not on file  . Number of children: Not on file  . Years of education: Not on file  . Highest education level: Not on file  Occupational History  . Not on file  Tobacco Use  . Smoking status: Former Smoker    Packs/day:  1.50    Years: 30.00    Pack years: 45.00    Types: Cigarettes    Quit date: 06/23/1979    Years since quitting: 40.2  . Smokeless tobacco: Never Used  Substance and Sexual Activity  . Alcohol use: Yes    Comment: occ whiskey helps with bronchitis  . Drug use: No  . Sexual activity: Not on file  Other Topics Concern  . Not on file  Social History Narrative  . Not on file   Social Determinants of Health   Financial Resource Strain:   . Difficulty of Paying Living Expenses:   Food Insecurity:   . Worried About Charity fundraiser in the Last Year:   . Arboriculturist in the Last Year:   Transportation Needs:   . Film/video editor (Medical):   Marland Kitchen Lack of Transportation (Non-Medical):   Physical Activity:   . Days of Exercise per Week:   . Minutes of Exercise per Session:   Stress:   . Feeling of Stress :   Social Connections:   . Frequency of Communication with Friends and Family:   . Frequency of Social Gatherings with Friends and Family:   . Attends Religious Services:   . Active Member of Clubs or Organizations:   . Attends Archivist Meetings:   Marland Kitchen Marital Status:   Intimate Partner Violence:   . Fear of Current or Ex-Partner:   . Emotionally Abused:   Marland Kitchen Physically Abused:   . Sexually Abused:     Outpatient Encounter Medications as of 09/11/2019  Medication Sig  . albuterol (PROVENTIL HFA;VENTOLIN HFA) 108 (90 BASE) MCG/ACT inhaler Inhale 2 puffs into the lungs every 6 (six) hours as needed.    . Alendronate Sodium (BINOSTO) 70 MG TBEF Take 70 mg by mouth once a week. Takes on wednesdays  . aspirin 81 MG tablet Take 81 mg by mouth daily.    . budesonide-formoterol (SYMBICORT) 160-4.5 MCG/ACT inhaler Inhale 2 puffs into the lungs 2 (two) times daily.  . clonazePAM (KLONOPIN) 2 MG tablet Take 0.5 tablets (1 mg total) by mouth at bedtime as needed.  . doxazosin (CARDURA) 4 MG tablet TAKE 1 TABLET (4 MG TOTAL) BY MOUTH AT BEDTIME.  . ergocalciferol  (VITAMIN D2) 1.25 MG (50000 UT) capsule Take 1 capsule (50,000 Units total) by mouth once a week. Takes on monday  . hydrochlorothiazide (HYDRODIURIL) 25 MG tablet Take 1 tablet (25 mg total) by mouth every morning.  Marland Kitchen leuprolide (LUPRON) 22.5 MG injection Inject 22.5 mg into the muscle every 3 (three) months.  Marland Kitchen losartan (COZAAR) 100 MG tablet TAKE 1 TABLET (100 MG TOTAL) BY MOUTH EVERY MORNING.  . MAGNESIUM PO Take 500 mg by mouth daily.  . Melatonin 12 MG TABS Take 1 tablet by mouth at bedtime.  . Multiple  Vitamin (MULTIVITAMIN) capsule Take 1 capsule by mouth daily.    . Omega-3 Fatty Acids (FISH OIL) 1200 MG CAPS Take 1 capsule by mouth daily.  Marland Kitchen omeprazole (PRILOSEC) 20 MG capsule TAKE 1 CAPSULE (20 MG TOTAL) BY MOUTH EVERY MORNING.  . [DISCONTINUED] clonazePAM (KLONOPIN) 2 MG tablet Take 0.5 tablets (1 mg total) by mouth at bedtime as needed.  . [DISCONTINUED] Red Yeast Rice 600 MG CAPS Take 2 capsules by mouth every morning.    No facility-administered encounter medications on file as of 09/11/2019.    Allergies  Allergen Reactions  . Diclofenac Shortness Of Breath and Swelling  . Guaifenesin Er Other (See Comments)    dizzy  . Roflumilast Palpitations    Heart race fast, depressed    Review of Systems  Constitutional: Negative for activity change, appetite change, chills, diaphoresis, fatigue, fever and unexpected weight change.  HENT: Negative.   Eyes: Negative.  Negative for photophobia and visual disturbance.  Respiratory: Positive for cough, shortness of breath and wheezing. Negative for chest tightness.   Cardiovascular: Negative for chest pain, palpitations and leg swelling.  Gastrointestinal: Negative for abdominal pain, blood in stool, constipation, diarrhea, nausea and vomiting.  Endocrine: Negative.  Negative for heat intolerance, polydipsia, polyphagia and polyuria.  Genitourinary: Negative for decreased urine volume, difficulty urinating, dysuria, frequency and  urgency.  Musculoskeletal: Positive for arthralgias, back pain, gait problem, joint swelling, myalgias, neck pain and neck stiffness.  Skin: Negative.   Allergic/Immunologic: Negative.   Neurological: Negative for dizziness, tremors, seizures, syncope, facial asymmetry, speech difficulty, weakness, light-headedness, numbness and headaches.  Hematological: Negative.   Psychiatric/Behavioral: Positive for sleep disturbance. Negative for agitation, behavioral problems, confusion, decreased concentration, dysphoric mood, hallucinations, self-injury and suicidal ideas. The patient is not nervous/anxious and is not hyperactive.   All other systems reviewed and are negative.       Objective:  BP (!) 112/54   Pulse 90   Temp 98.7 F (37.1 C)   Resp 20   Ht 5' 10" (1.778 m)   Wt 193 lb (87.5 kg)   SpO2 96%   BMI 27.69 kg/m    Wt Readings from Last 3 Encounters:  09/11/19 193 lb (87.5 kg)  06/28/19 191 lb (86.6 kg)  06/13/19 189 lb (85.7 kg)    Physical Exam Vitals and nursing note reviewed.  Constitutional:      General: He is not in acute distress.    Appearance: Normal appearance. He is well-developed, well-groomed and overweight. He is not ill-appearing, toxic-appearing or diaphoretic.  HENT:     Head: Normocephalic and atraumatic.     Jaw: There is normal jaw occlusion.     Right Ear: Hearing normal.     Left Ear: Hearing normal.     Nose: Nose normal.     Mouth/Throat:     Lips: Pink.     Mouth: Mucous membranes are moist.     Pharynx: Oropharynx is clear. Uvula midline.  Eyes:     General: Lids are normal.     Extraocular Movements: Extraocular movements intact.     Conjunctiva/sclera: Conjunctivae normal.     Pupils: Pupils are equal, round, and reactive to light.  Neck:     Thyroid: No thyroid mass, thyromegaly or thyroid tenderness.     Vascular: No carotid bruit or JVD.     Trachea: Trachea and phonation normal.  Cardiovascular:     Rate and Rhythm: Normal rate  and regular rhythm.     Chest Wall:  PMI is not displaced.     Pulses: Normal pulses.     Heart sounds: Normal heart sounds. No murmur. No friction rub. No gallop.   Pulmonary:     Effort: Pulmonary effort is normal. No respiratory distress.     Breath sounds: Normal breath sounds. No wheezing.  Abdominal:     General: Bowel sounds are normal. There is no distension or abdominal bruit.     Palpations: Abdomen is soft. There is no hepatomegaly or splenomegaly.     Tenderness: There is no abdominal tenderness. There is no right CVA tenderness or left CVA tenderness.     Hernia: No hernia is present.  Musculoskeletal:     Right shoulder: Normal.     Left shoulder: Normal.     Right forearm: Normal.     Left forearm: Normal.     Right wrist: Decreased range of motion.     Left wrist: Decreased range of motion.     Right hand: Decreased range of motion.     Left hand: Decreased range of motion.     Cervical back: Neck supple. Tenderness present. No swelling, edema, deformity, erythema, signs of trauma, lacerations, rigidity, spasms, torticollis, bony tenderness or crepitus. No pain with movement. Decreased range of motion.     Thoracic back: Normal.     Lumbar back: Tenderness present. No swelling, edema, deformity, signs of trauma, lacerations, spasms or bony tenderness. Decreased range of motion. Negative right straight leg raise test and negative left straight leg raise test. No scoliosis.     Right hip: Normal.     Left hip: Normal.     Right knee: No swelling, deformity, effusion, erythema, ecchymosis or lacerations. Decreased range of motion.     Left knee: No swelling, deformity, effusion, erythema or ecchymosis. Decreased range of motion.     Right lower leg: Normal. No edema.     Left lower leg: Normal. No edema.     Right ankle: Swelling present. No deformity, ecchymosis or lacerations. Tenderness present. Decreased range of motion. Normal pulse.     Right Achilles Tendon: Normal.      Left ankle: Swelling present. No deformity, ecchymosis or lacerations. Tenderness present. Decreased range of motion. Normal pulse.     Left Achilles Tendon: Normal.     Right foot: Normal.     Left foot: Normal.  Lymphadenopathy:     Cervical: No cervical adenopathy.  Skin:    General: Skin is warm and dry.     Capillary Refill: Capillary refill takes less than 2 seconds.     Coloration: Skin is not cyanotic, jaundiced or pale.     Findings: No rash.  Neurological:     General: No focal deficit present.     Mental Status: He is alert and oriented to person, place, and time.     Cranial Nerves: Cranial nerves are intact. No cranial nerve deficit.     Sensory: Sensation is intact. No sensory deficit.     Motor: Motor function is intact. No weakness.     Coordination: Coordination is intact. Coordination normal.     Gait: Gait abnormal (antalgic, uses cane).     Deep Tendon Reflexes: Reflexes are normal and symmetric. Reflexes normal.  Psychiatric:        Attention and Perception: Attention and perception normal.        Mood and Affect: Mood and affect normal.        Speech: Speech normal.  Behavior: Behavior normal. Behavior is cooperative.        Thought Content: Thought content normal.        Cognition and Memory: Cognition and memory normal.        Judgment: Judgment normal.     Results for orders placed or performed in visit on 06/13/19  CMP14+EGFR  Result Value Ref Range   Glucose 97 65 - 99 mg/dL   BUN 23 8 - 27 mg/dL   Creatinine, Ser 0.86 0.76 - 1.27 mg/dL   GFR calc non Af Amer 81 >59 mL/min/1.73   GFR calc Af Amer 94 >59 mL/min/1.73   BUN/Creatinine Ratio 27 (H) 10 - 24   Sodium 141 134 - 144 mmol/L   Potassium 4.1 3.5 - 5.2 mmol/L   Chloride 101 96 - 106 mmol/L   CO2 26 20 - 29 mmol/L   Calcium 10.1 8.6 - 10.2 mg/dL   Total Protein 6.2 6.0 - 8.5 g/dL   Albumin 4.4 3.6 - 4.6 g/dL   Globulin, Total 1.8 1.5 - 4.5 g/dL   Albumin/Globulin Ratio 2.4 (H)  1.2 - 2.2   Bilirubin Total 0.4 0.0 - 1.2 mg/dL   Alkaline Phosphatase 74 39 - 117 IU/L   AST 21 0 - 40 IU/L   ALT 17 0 - 44 IU/L  CBC with Differential/Platelet  Result Value Ref Range   WBC 8.0 3.4 - 10.8 x10E3/uL   RBC 4.61 4.14 - 5.80 x10E6/uL   Hemoglobin 15.0 13.0 - 17.7 g/dL   Hematocrit 42.9 37.5 - 51.0 %   MCV 93 79 - 97 fL   MCH 32.5 26.6 - 33.0 pg   MCHC 35.0 31.5 - 35.7 g/dL   RDW 12.0 11.6 - 15.4 %   Platelets 180 150 - 450 x10E3/uL   Neutrophils 70 Not Estab. %   Lymphs 17 Not Estab. %   Monocytes 8 Not Estab. %   Eos 4 Not Estab. %   Basos 1 Not Estab. %   Neutrophils Absolute 5.7 1.4 - 7.0 x10E3/uL   Lymphocytes Absolute 1.4 0.7 - 3.1 x10E3/uL   Monocytes Absolute 0.6 0.1 - 0.9 x10E3/uL   EOS (ABSOLUTE) 0.3 0.0 - 0.4 x10E3/uL   Basophils Absolute 0.1 0.0 - 0.2 x10E3/uL   Immature Granulocytes 0 Not Estab. %   Immature Grans (Abs) 0.0 0.0 - 0.1 x10E3/uL  Lipid panel  Result Value Ref Range   Cholesterol, Total 208 (H) 100 - 199 mg/dL   Triglycerides 155 (H) 0 - 149 mg/dL   HDL 56 >39 mg/dL   VLDL Cholesterol Cal 27 5 - 40 mg/dL   LDL Chol Calc (NIH) 125 (H) 0 - 99 mg/dL   Chol/HDL Ratio 3.7 0.0 - 5.0 ratio  Thyroid Panel With TSH  Result Value Ref Range   TSH 3.670 0.450 - 4.500 uIU/mL   T4, Total 8.4 4.5 - 12.0 ug/dL   T3 Uptake Ratio 29 24 - 39 %   Free Thyroxine Index 2.4 1.2 - 4.9  DRUG SCREEN-TOXASSURE  Result Value Ref Range   Summary Note        Pertinent labs & imaging results that were available during my care of the patient were reviewed by me and considered in my medical decision making.  Assessment & Plan:  [name] was seen today for medical management of chronic issues, copd and hypertension.  Diagnoses and all orders for this visit:  COPD mixed type ([place]) Having worsening exertional shortness of breath. States he is fine  resting but has difficulty with walking short distances. Would benefit from a wheelchair. Unable to use manual due  to arthritic changes in wrists.  -     For home use only DME Wheelchair electric  Primary osteoarthritis involving multiple joints Worsening stiffness in wrists and ankles. Has a hard time ambulating due the stiffness in his ankles. Unable to use a manual wheelchair due to the arthritic changes in his wrists and hands. Would do well with electric wheelchair.  -     For home use only DME Wheelchair electric  Essential hypertension BP well controlled. Changes were not made in regimen today. Goal BP is 130/80. Pt aware to report any persistent high or low readings. DASH diet and exercise encouraged. Exercise at least 150 minutes per week and increase as tolerated. Goal BMI > 25. Stress management encouraged. Avoid nicotine and tobacco product use. Avoid excessive alcohol and NSAID's. Avoid more than 2000 mg of sodium daily. Medications as prescribed. Follow up as scheduled.   Dyslipidemia Has been taking Omega-3 Fish oil. Last lipid panel in December. Will get labs at next visit. Diet and exercise encouraged.   Primary insomnia Doing well on below. Will continue.  -     clonazePAM (KLONOPIN) 2 MG tablet; Take 0.5 tablets (1 mg total) by mouth at bedtime as needed.     Continue all other maintenance medications.  Follow up plan: Return in about 3 months (around 12/12/2019), or if symptoms worsen or fail to improve.   Medical decision-making:   30 minutes spent today reviewing the medical chart, counseling the patient/family, and documenting today's visit.  Continue healthy lifestyle choices, including diet (rich in fruits, vegetables, and lean proteins, and low in salt and simple carbohydrates) and exercise (at least 30 minutes of moderate physical activity daily).   The above assessment and management plan was discussed with the patient. The patient verbalized understanding of and has agreed to the management plan. Patient is aware to call the clinic if they develop any new symptoms or if  symptoms persist or worsen. Patient is aware when to return to the clinic for a follow-up visit. Patient educated on when it is appropriate to go to the emergency department.   Monia Pouch, FNP-C [facility] [phone]

## 2019-09-14 DIAGNOSIS — X32XXXD Exposure to sunlight, subsequent encounter: Secondary | ICD-10-CM | POA: Diagnosis not present

## 2019-09-14 DIAGNOSIS — Z85828 Personal history of other malignant neoplasm of skin: Secondary | ICD-10-CM | POA: Diagnosis not present

## 2019-09-14 DIAGNOSIS — L570 Actinic keratosis: Secondary | ICD-10-CM | POA: Diagnosis not present

## 2019-09-14 DIAGNOSIS — Z08 Encounter for follow-up examination after completed treatment for malignant neoplasm: Secondary | ICD-10-CM | POA: Diagnosis not present

## 2019-09-20 ENCOUNTER — Other Ambulatory Visit: Payer: Self-pay | Admitting: Family Medicine

## 2019-10-13 ENCOUNTER — Other Ambulatory Visit: Payer: Self-pay | Admitting: *Deleted

## 2019-10-13 DIAGNOSIS — K219 Gastro-esophageal reflux disease without esophagitis: Secondary | ICD-10-CM

## 2019-10-13 MED ORDER — OMEPRAZOLE 20 MG PO CPDR: 20.0000 mg | DELAYED_RELEASE_CAPSULE | Freq: Every morning | ORAL | 0 refills | Status: DC

## 2019-10-13 NOTE — Telephone Encounter (Signed)
NOV 12/15/19

## 2019-10-16 ENCOUNTER — Other Ambulatory Visit: Payer: Self-pay | Admitting: *Deleted

## 2019-10-16 DIAGNOSIS — I10 Essential (primary) hypertension: Secondary | ICD-10-CM

## 2019-10-16 DIAGNOSIS — N401 Enlarged prostate with lower urinary tract symptoms: Secondary | ICD-10-CM

## 2019-10-16 MED ORDER — DOXAZOSIN MESYLATE 4 MG PO TABS: 4.0000 mg | ORAL_TABLET | Freq: Every day | ORAL | 1 refills | Status: DC

## 2019-10-16 MED ORDER — LOSARTAN POTASSIUM 100 MG PO TABS: 100.0000 mg | ORAL_TABLET | Freq: Every morning | ORAL | 1 refills | Status: DC

## 2019-10-25 DIAGNOSIS — H52209 Unspecified astigmatism, unspecified eye: Secondary | ICD-10-CM | POA: Diagnosis not present

## 2019-10-25 DIAGNOSIS — H02834 Dermatochalasis of left upper eyelid: Secondary | ICD-10-CM | POA: Diagnosis not present

## 2019-10-25 DIAGNOSIS — Z961 Presence of intraocular lens: Secondary | ICD-10-CM | POA: Diagnosis not present

## 2019-10-25 DIAGNOSIS — E7800 Pure hypercholesterolemia, unspecified: Secondary | ICD-10-CM | POA: Diagnosis not present

## 2019-10-25 DIAGNOSIS — H32 Chorioretinal disorders in diseases classified elsewhere: Secondary | ICD-10-CM | POA: Diagnosis not present

## 2019-10-25 DIAGNOSIS — H02831 Dermatochalasis of right upper eyelid: Secondary | ICD-10-CM | POA: Diagnosis not present

## 2019-11-06 DIAGNOSIS — C61 Malignant neoplasm of prostate: Secondary | ICD-10-CM | POA: Diagnosis not present

## 2019-11-22 ENCOUNTER — Other Ambulatory Visit: Payer: Self-pay | Admitting: Family Medicine

## 2019-12-15 ENCOUNTER — Ambulatory Visit (INDEPENDENT_AMBULATORY_CARE_PROVIDER_SITE_OTHER): Payer: Medicare HMO | Admitting: Family Medicine

## 2019-12-15 ENCOUNTER — Other Ambulatory Visit: Payer: Self-pay

## 2019-12-15 ENCOUNTER — Encounter: Payer: Self-pay | Admitting: Family Medicine

## 2019-12-15 VITALS — BP 128/54 | HR 64 | Temp 97.6°F | Ht 70.0 in | Wt 191.6 lb

## 2019-12-15 DIAGNOSIS — E785 Hyperlipidemia, unspecified: Secondary | ICD-10-CM | POA: Diagnosis not present

## 2019-12-15 DIAGNOSIS — M159 Polyosteoarthritis, unspecified: Secondary | ICD-10-CM

## 2019-12-15 DIAGNOSIS — J449 Chronic obstructive pulmonary disease, unspecified: Secondary | ICD-10-CM | POA: Diagnosis not present

## 2019-12-15 DIAGNOSIS — F5101 Primary insomnia: Secondary | ICD-10-CM

## 2019-12-15 DIAGNOSIS — G8929 Other chronic pain: Secondary | ICD-10-CM

## 2019-12-15 DIAGNOSIS — N401 Enlarged prostate with lower urinary tract symptoms: Secondary | ICD-10-CM | POA: Diagnosis not present

## 2019-12-15 DIAGNOSIS — M8949 Other hypertrophic osteoarthropathy, multiple sites: Secondary | ICD-10-CM | POA: Diagnosis not present

## 2019-12-15 DIAGNOSIS — R350 Frequency of micturition: Secondary | ICD-10-CM | POA: Diagnosis not present

## 2019-12-15 DIAGNOSIS — C61 Malignant neoplasm of prostate: Secondary | ICD-10-CM

## 2019-12-15 DIAGNOSIS — K219 Gastro-esophageal reflux disease without esophagitis: Secondary | ICD-10-CM

## 2019-12-15 DIAGNOSIS — I10 Essential (primary) hypertension: Secondary | ICD-10-CM | POA: Diagnosis not present

## 2019-12-15 DIAGNOSIS — M810 Age-related osteoporosis without current pathological fracture: Secondary | ICD-10-CM | POA: Diagnosis not present

## 2019-12-15 DIAGNOSIS — M545 Low back pain: Secondary | ICD-10-CM

## 2019-12-15 DIAGNOSIS — E559 Vitamin D deficiency, unspecified: Secondary | ICD-10-CM

## 2019-12-15 MED ORDER — CLONAZEPAM 1 MG PO TABS: 1.0000 mg | ORAL_TABLET | ORAL | 0 refills | Status: DC

## 2019-12-15 MED ORDER — DOXAZOSIN MESYLATE 4 MG PO TABS: 4.0000 mg | ORAL_TABLET | Freq: Every day | ORAL | 1 refills | Status: DC

## 2019-12-15 MED ORDER — HYDROCHLOROTHIAZIDE 25 MG PO TABS: 25.0000 mg | ORAL_TABLET | Freq: Every morning | ORAL | 1 refills | Status: AC

## 2019-12-15 MED ORDER — ALENDRONATE SODIUM 70 MG PO TABS: 70.0000 mg | ORAL_TABLET | ORAL | 1 refills | Status: AC

## 2019-12-15 MED ORDER — OMEPRAZOLE 20 MG PO CPDR: 20.0000 mg | DELAYED_RELEASE_CAPSULE | Freq: Every morning | ORAL | 1 refills | Status: AC

## 2019-12-15 MED ORDER — TRAZODONE HCL 50 MG PO TABS: 25.0000 mg | ORAL_TABLET | Freq: Every evening | ORAL | 3 refills | Status: DC | PRN

## 2019-12-15 MED ORDER — LOSARTAN POTASSIUM 100 MG PO TABS: 100.0000 mg | ORAL_TABLET | Freq: Every morning | ORAL | 1 refills | Status: DC

## 2019-12-15 NOTE — Progress Notes (Signed)
Assessment & Plan:  1. Primary insomnia - Patient is going to wean off clonazepam and start Trazodone for sleep. If Trazodone works well enough, can also stop melatonin. Decreasing clonazepam from nightly to every other night. He is going to take Trazodone on nights he doesn't take clonazepam.  - clonazePAM (KLONOPIN) 1 MG tablet; Take 1 tablet (1 mg total) by mouth every other day.  Dispense: 15 tablet; Refill: 0 - traZODone (DESYREL) 50 MG tablet; Take 0.5-1 tablets (25-50 mg total) by mouth at bedtime as needed for sleep.  Dispense: 30 tablet; Refill: 3 - CMP14+EGFR  2. Primary osteoarthritis involving multiple joints - Discussed if he weans off completely from clonazepam, I would prescribe something for pain.   3. Chronic low back pain without sciatica, unspecified back pain laterality - Discussed if he weans off completely from clonazepam, I would prescribe something for pain.   4. COPD mixed type ([place]) - Discussed the Breztri inhaler. Patient [facility] I will call them to see if I can send a prescription to them.   5. Essential hypertension - Well controlled on current regimen.  - hydrochlorothiazide (HYDRODIURIL) 25 MG tablet; Take 1 tablet (25 mg total) by mouth every morning.  Dispense: 90 tablet; Refill: 1 - losartan (COZAAR) 100 MG tablet; Take 1 tablet (100 mg total) by mouth every morning.  Dispense: 90 tablet; Refill: 1 - CMP14+EGFR  6. Dyslipidemia - Last lipid panel in our office was 6 months ago. Patient reports recent blood work at the [place]  7. Age-related osteoporosis without current pathological fracture - Well controlled on current regimen.  - alendronate (FOSAMAX) 70 MG tablet; Take 1 tablet (70 mg total) by mouth once a week. Take with a full glass of water on an empty stomach.  Dispense: 12 tablet; Refill: 1 - CMP14+EGFR  8. Benign prostatic hyperplasia with urinary frequency - Well controlled on current regimen.  -  doxazosin (CARDURA) 4 MG tablet; Take 1 tablet (4 mg total) by mouth at bedtime.  Dispense: 90 tablet; Refill: 1 - CMP14+EGFR  9. Gastroesophageal reflux disease without esophagitis - Well controlled on current regimen.  - omeprazole (PRILOSEC) 20 MG capsule; Take 1 capsule (20 mg total) by mouth every morning.  Dispense: 90 capsule; Refill: 1 - CMP14+EGFR  10. Prostate cancer ([place]) - Well controlled on current regimen. Lupron injections every 3 months.   11. Vitamin D deficiency - Last vitamin D level in July 2020. Patient is taking his supplement.    Return in about 4 weeks (around 01/12/2020) for sleep.  Hendricks Limes, MSN, APRN, FNP-C [facility]  Subjective:    Patient ID: [name], male    DOB: 10/13/1935, 84 y.o.   MRN: [id number]  Patient Care Team: Loman Brooklyn, FNP as PCP - General (Family Medicine) Danie Binder, MD (Inactive) (Gastroenterology)   Chief Complaint:  Chief Complaint  Patient presents with  . Establish Care    [facility] pt   . COPD    3 month follow up of chronic medical conditions  . Hypertension    HPI: [name] is a 84 y.o. male presenting on 12/15/2019 for Establish Care (rakes pt ), COPD (3 month follow up of chronic medical conditions), and Hypertension  Patient reports chronic neck and back pain.  States they are just wore out and he has arthritis everywhere.  He is taking ibuprofen for pain occasionally.  Patient states he would love  to have something better for pain but nobody has ever been willing to give it to him.  When I asked him why he said because of the government.  Patient has a prescription for clonazepam that he reports he takes for sleep.  He is also taking melatonin at bedtime as the clonazepam is just not enough.  He has tried and failed Ambien in the past.  Patient has COPD and reports he uses his Symbicort twice daily as prescribed.  He is also requiring his albuterol inhaler every day.  He  does see Dr. Annamaria Boots for pulmonology but reports he gets his inhalers through the [place] in [place].  Patient gets Lupron injections every 3 months due to history of prostate cancer.   Social history:  Relevant past medical, surgical, family and social history reviewed and updated as indicated. Interim medical history since our last visit reviewed.  Allergies and medications reviewed and updated.  DATA REVIEWED: CHART IN EPIC  ROS: Negative unless specifically indicated above in HPI.    Current Outpatient Medications:  .  albuterol (PROVENTIL HFA;VENTOLIN HFA) 108 (90 BASE) MCG/ACT inhaler, Inhale 2 puffs into the lungs every 6 (six) hours as needed.  , Disp: , Rfl:  .  alendronate (FOSAMAX) 70 MG tablet, TAKE 1 TABLET ONE TIME WEEKLY ON WEDNESDAY , Disp: 12 tablet, Rfl: 0 .  Alendronate Sodium (BINOSTO) 70 MG TBEF, Take 70 mg by mouth once a week. Takes on wednesdays, Disp: 4 tablet, Rfl: 6 .  aspirin 81 MG tablet, Take 81 mg by mouth daily.  , Disp: , Rfl:  .  budesonide-formoterol (SYMBICORT) 160-4.5 MCG/ACT inhaler, Inhale 2 puffs into the lungs 2 (two) times daily., Disp: , Rfl:  .  doxazosin (CARDURA) 4 MG tablet, Take 1 tablet (4 mg total) by mouth at bedtime., Disp: 90 tablet, Rfl: 1 .  ergocalciferol (VITAMIN D2) 1.25 MG (50000 UT) capsule, Take 1 capsule (50,000 Units total) by mouth once a week. Takes on monday, Disp: 5 capsule, Rfl: 5 .  leuprolide (LUPRON) 22.5 MG injection, Inject 22.5 mg into the muscle every 3 (three) months., Disp: , Rfl:  .  losartan (COZAAR) 100 MG tablet, Take 1 tablet (100 mg total) by mouth every morning., Disp: 90 tablet, Rfl: 1 .  MAGNESIUM PO, Take 500 mg by mouth daily., Disp: , Rfl:  .  Melatonin 12 MG TABS, Take 1 tablet by mouth at bedtime., Disp: 90 tablet, Rfl: 3 .  Multiple Vitamin (MULTIVITAMIN) capsule, Take 1 capsule by mouth daily.  , Disp: , Rfl:  .  Omega-3 Fatty Acids (FISH OIL) 1200 MG CAPS, Take 1 capsule by mouth daily., Disp: , Rfl:   .  omeprazole (PRILOSEC) 20 MG capsule, Take 1 capsule (20 mg total) by mouth every morning., Disp: 90 capsule, Rfl: 0 .  clonazePAM (KLONOPIN) 2 MG tablet, Take 0.5 tablets (1 mg total) by mouth at bedtime as needed., Disp: 30 tablet, Rfl: 3 .  hydrochlorothiazide (HYDRODIURIL) 25 MG tablet, Take 1 tablet (25 mg total) by mouth every morning., Disp: 90 tablet, Rfl: 3   Allergies  Allergen Reactions  . Diclofenac Shortness Of Breath and Swelling  . Guaifenesin Er Other (See Comments)    dizzy  . Roflumilast Palpitations    Heart race fast, depressed   Past Medical History:  Diagnosis Date  . Arthritis    neck, back, feet and ankles, feet and ankles swell due to arthritis, prop feet up swelling goes down  . Bronchiectasis without complication (  [place])    bronchitis oct 2019 got clearance for surgery  . COPD (chronic obstructive pulmonary disease) with chronic bronchitis ([place])    pulmologist-  dr Tarri Fuller young  . Dyspnea    with heavy exertion  . GERD (gastroesophageal reflux disease)   . History of colon polyps    2007--  simple adenoma  . History of gastritis    SECONDARY TO ADVIL  . History of skin cancer    EXCISION LEFT SIDE OF FACE and neck  . Hypertension   . Insomnia   . Nocturia   . Prostate cancer [facility])    dx  2009--  XRT takine lupron shots for last shot oct 2019    Past Surgical History:  Procedure Laterality Date  . CATARACT EXTRACTION W/ INTRAOCULAR LENS  IMPLANT, [facility]  . COLONOSCOPY  JUL 2007 D25 V1   Simple adenomas(RECTUM), HYPERPLASTIC COLON POLYPS, [facility]  . COLONOSCOPY  03/18/2011   Procedure: COLONOSCOPY;  Surgeon: Dorothyann Peng, MD;  [facility];  Service: Endoscopy;  Laterality: N/A;  8:30  . CRYOABLATION N/A 12/29/2013   Procedure: CRYO ABLATION PROSTATE;  Surgeon: Arvil Persons, MD;  Location[facility];  Service: Urology;  Laterality: N/A;  . CYSTOSCOPY N/A 04/29/2018   Procedure: Erlene Quan;   Surgeon: Festus Aloe, MD;  Location: [facility];  Service: Urology;  Laterality: N/A;  . skin cancers removed  2018   left arm  . UPPER GASTROINTESTINAL ENDOSCOPY  2007 DYSPEPSIA D50 V4   NSAID GASTRITIS    Social History   Socioeconomic History  . Marital status: Divorced    Spouse name: Not on file  . Number of children: Not on file  . Years of education: Not on file  . Highest education level: Not on file  Occupational History  . Not on file  Tobacco Use  . Smoking status: Former Smoker    Packs/day: 1.50    Years: 30.00    Pack years: 45.00    Types: Cigarettes    Quit date: 06/23/1979    Years since quitting: 40.5  . Smokeless tobacco: Never Used  Vaping Use  . Vaping Use: Never used  Substance and Sexual Activity  . Alcohol use: Yes    Comment: occ whiskey helps with bronchitis  . Drug use: No  . Sexual activity: Not on file  Other Topics Concern  . Not on file  Social History Narrative  . Not on file   Social Determinants of Health   Financial Resource Strain:   . Difficulty of Paying Living Expenses:   Food Insecurity:   . Worried About Charity fundraiser in the Last Year:   . Arboriculturist in the Last Year:   Transportation Needs:   . Film/video editor (Medical):   Marland Kitchen Lack of Transportation (Non-Medical):   Physical Activity:   . Days of Exercise per Week:   . Minutes of Exercise per Session:   Stress:   . Feeling of Stress :   Social Connections:   . Frequency of Communication with Friends and Family:   . Frequency of Social Gatherings with Friends and Family:   . Attends Religious Services:   . Active Member of Clubs or Organizations:   . Attends Archivist Meetings:   Marland Kitchen Marital Status:   Intimate Partner Violence:   . Fear of Current or Ex-Partner:   . Emotionally Abused:   .  Physically Abused:   . Sexually Abused:         Objective:    BP (!) 128/54   Pulse 64   Temp 97.6 F (36.4 C) (Temporal)    Ht 5' 10" (1.778 m)   Wt 191 lb 9.6 oz (86.9 kg)   SpO2 93%   BMI 27.49 kg/m   Wt Readings from Last 3 Encounters:  12/15/19 191 lb 9.6 oz (86.9 kg)  09/11/19 193 lb (87.5 kg)  06/28/19 191 lb (86.6 kg)    Physical Exam Vitals reviewed.  Constitutional:      General: He is not in acute distress.    Appearance: Normal appearance. He is overweight. He is not ill-appearing, toxic-appearing or diaphoretic.  HENT:     Head: Normocephalic and atraumatic.  Eyes:     General: No scleral icterus.       Right eye: No discharge.        Left eye: No discharge.     Conjunctiva/sclera: Conjunctivae normal.  Cardiovascular:     Rate and Rhythm: Normal rate and regular rhythm.     Heart sounds: Normal heart sounds. No murmur heard.  No friction rub. No gallop.   Pulmonary:     Effort: Pulmonary effort is normal. No respiratory distress.     Breath sounds: Normal breath sounds. No stridor. No wheezing, rhonchi or rales.  Musculoskeletal:        General: Normal range of motion.     Cervical back: Normal range of motion.  Skin:    General: Skin is warm and dry.  Neurological:     Mental Status: He is alert and oriented to person, place, and time. Mental status is at baseline.  Psychiatric:        Mood and Affect: Mood normal.        Behavior: Behavior normal.        Thought Content: Thought content normal.        Judgment: Judgment normal.     Lab Results  Component Value Date   TSH 3.670 06/13/2019   Lab Results  Component Value Date   WBC 8.0 06/13/2019   HGB 15.0 06/13/2019   HCT 42.9 06/13/2019   MCV 93 06/13/2019   PLT 180 06/13/2019   Lab Results  Component Value Date   NA 139 12/15/2019   K 4.0 12/15/2019   CO2 27 12/15/2019   GLUCOSE 93 12/15/2019   BUN 22 12/15/2019   CREATININE 1.03 12/15/2019   BILITOT 0.3 12/15/2019   ALKPHOS 71 12/15/2019   AST 20 12/15/2019   ALT 17 12/15/2019   PROT 6.2 12/15/2019   ALBUMIN 3.9 12/15/2019   CALCIUM 9.6 12/15/2019    Lab Results  Component Value Date   CHOL 208 (H) 06/13/2019   Lab Results  Component Value Date   HDL 56 06/13/2019   Lab Results  Component Value Date   LDLCALC 125 (H) 06/13/2019   Lab Results  Component Value Date   TRIG 155 (H) 06/13/2019   Lab Results  Component Value Date   CHOLHDL 3.7 06/13/2019   No results found for: HGBA1C

## 2019-12-16 LAB — CMP14+EGFR
ALT: 17 IU/L (ref 0–44)
AST: 20 IU/L (ref 0–40)
Albumin/Globulin Ratio: 1.7 (ref 1.2–2.2)
Albumin: 3.9 g/dL (ref 3.6–4.6)
Alkaline Phosphatase: 71 IU/L (ref 48–121)
BUN/Creatinine Ratio: 21 (ref 10–24)
BUN: 22 mg/dL (ref 8–27)
Bilirubin Total: 0.3 mg/dL (ref 0.0–1.2)
CO2: 27 mmol/L (ref 20–29)
Calcium: 9.6 mg/dL (ref 8.6–10.2)
Chloride: 102 mmol/L (ref 96–106)
Creatinine, Ser: 1.03 mg/dL (ref 0.76–1.27)
GFR calc Af Amer: 78 mL/min/{1.73_m2} (ref >59)
GFR calc non Af Amer: 67 mL/min/{1.73_m2} (ref >59)
Globulin, Total: 2.3 g/dL (ref 1.5–4.5)
Glucose: 93 mg/dL (ref 65–99)
Potassium: 4 mmol/L (ref 3.5–5.2)
Sodium: 139 mmol/L (ref 134–144)
Total Protein: 6.2 g/dL (ref 6.0–8.5)

## 2019-12-17 ENCOUNTER — Encounter: Payer: Self-pay | Admitting: Family Medicine

## 2019-12-19 ENCOUNTER — Encounter: Payer: Self-pay | Admitting: Family Medicine

## 2019-12-25 MED ORDER — BREZTRI AEROSPHERE 160-9-4.8 MCG/ACT IN AERO: 2.0000 | INHALATION_SPRAY | Freq: Two times a day (BID) | RESPIRATORY_TRACT | 5 refills | Status: DC

## 2019-12-25 NOTE — Addendum Note (Signed)
Addended by: Hendricks Limes F on: 12/25/2019 05:07 PM   Modules accepted: Orders

## 2019-12-26 ENCOUNTER — Encounter: Payer: Self-pay | Admitting: Internal Medicine

## 2019-12-26 ENCOUNTER — Other Ambulatory Visit: Payer: Self-pay

## 2019-12-26 ENCOUNTER — Ambulatory Visit: Payer: Medicare HMO | Admitting: Internal Medicine

## 2019-12-26 DIAGNOSIS — K219 Gastro-esophageal reflux disease without esophagitis: Secondary | ICD-10-CM | POA: Diagnosis not present

## 2019-12-26 DIAGNOSIS — J4489 Other specified chronic obstructive pulmonary disease: Secondary | ICD-10-CM

## 2019-12-26 DIAGNOSIS — J449 Chronic obstructive pulmonary disease, unspecified: Secondary | ICD-10-CM

## 2019-12-26 MED ORDER — BREZTRI AEROSPHERE 160-9-4.8 MCG/ACT IN AERO: 2.0000 | INHALATION_SPRAY | Freq: Two times a day (BID) | RESPIRATORY_TRACT | 0 refills | Status: DC

## 2019-12-26 NOTE — Patient Instructions (Signed)
Sample x 2 Breztri inhaler     Inhale 2 puffs, then rinse mouth, twice daily Try this instead of Symbicort. When the samples run out, you can decide whether to have [place] [facility], or stick with Symbicort.  Please call if we can help

## 2019-12-26 NOTE — Progress Notes (Signed)
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  Walked 3 laps = 555 feet. Office Spirometry 04/14/2018-moderately severe obstructive airways disease, FVC 2.3/58%, FEV1 1.5/53%, ratio 0.66, FEF 25-75% 0.9/45% -------------------------------------------------------------------------------------------------------------  06/28/19- 84 year old male [place] former smoker followed for chronic bronchitis/bronchiectasis COPD, insomnia, complicated by HBP, chronic gastritis, GERD, atherosclerosis, prostate cancer Spiriva HandiHaler, Symbicort 160, albuterol HFA, -----f/u Bronchiectasis/ COPD Breathing some better since LOV. He was having to travel frequently between a hot [place] home and his own cooler home and he thinks temperature changes bothered him.  Not using Spiriva. Continues Symbicort and occ rescue albuterol. [facility]. Added melatonin for sleep with clonazepam- seems to help insomnia.  CXR 02/24/2019-  IMPRESSION: 1. Low lung volumes with mild bibasilar scarring, but no radiographic evidence of acute cardiopulmonary disease. 2. Aortic atherosclerosis.  12/26/19- 84 year old male [place] former smoker followed for chronic bronchitis/bronchiectasis COPD, insomnia, complicated by HBP, chronic gastritis, GERD, atherosclerosis, prostate cancer Spiriva HandiHaler, Symbicort 160, albuterol HFA, Covid vax- 2 Moderna Less cough and sputum lately. More aware of DOE esp with mask on.  No fever, blood or acute issues.   ROS-See HPI  + = positive Constitutional:   No-   weight loss,  night sweats, fevers, chills, fatigue, +lassitude. HEENT:   No-  headaches, difficulty swallowing, tooth/dental problems, sore throat,       No-  sneezing, itching, ear ache,     no current- nasal congestion, post nasal drip,  CV:  No-chest pain, orthopnea, PND, swelling in lower extremities, anasarca, dizziness, palpitations Resp: + shortness of breath with exertion or at rest.                +productive cough,  + non-productive cough,  No- coughing up of blood.              -change in color of mucus.  No- wheezing.   Skin: No-   rash or lesions. GI: + heartburn, indigestion,  No-abdominal pain, nausea, vomiting,  GU:  MS:  No-   joint pain or swelling.    + back pain. Neuro-     nothing unusual Psych:  No- change in mood or affect. No depression or anxiety.  No memory loss.  OBJ General- Alert, Oriented, Affect-appropriate, Distress- none acute; +overweight Skin- rash-none, lesions- none, excoriation- none Lymphadenopathy- none Head- atraumatic            Eyes- Gross vision intact, PERRLA, conjunctivae clear secretions            Ears- Hearing, canals-normal            Nose- Clear, no-Septal dev, mucus, polyps, erosion, perforation             Throat- Mallampati III , mucosa cobblestoned , drainage- none, tonsils- atrophic Neck- flexible , trachea midline, no stridor , thyroid nl, carotid no bruit Chest - symmetrical excursion , unlabored           Heart/CV- RRR , no murmur , no gallop  , no rub, nl s1 s2                           -  JVD- none , edema- none, stasis changes- none, varices- none           Lung-+ trace crackles only= good for him,+ slow exhalation, Unlabored , dullness-none, rub- none,            Chest wall-  Abd-  Br/ Gen/ Rectal- Not done, not indicated Extrem- cyanosis- none, clubbing, none, atrophy- none, strength- nl Neuro- grossly intact to observation

## 2019-12-29 ENCOUNTER — Telehealth: Payer: Self-pay | Admitting: Family Medicine

## 2019-12-29 NOTE — Telephone Encounter (Signed)
Left message to please call our office. 

## 2019-12-29 NOTE — Telephone Encounter (Signed)
FYI for provider

## 2019-12-29 NOTE — Telephone Encounter (Signed)
He was started on very low dose of Trazodone and I explained we would likely need to go up on it. How much was he taking of it when he tried it? If 25 mg, tell him to increase to 50 mg. If 50 mg, tell him to increase to 100 mg.

## 2020-01-03 NOTE — Telephone Encounter (Signed)
Patient said he will try it tonight and if it done work he is throwing them away

## 2020-01-05 ENCOUNTER — Telehealth: Payer: Self-pay | Admitting: Family Medicine

## 2020-01-05 MED ORDER — TEMAZEPAM 15 MG PO CAPS
15.0000 mg | ORAL_CAPSULE | Freq: Every evening | ORAL | 0 refills | Status: DC | PRN
Start: 2020-01-05 — End: 2020-01-16

## 2020-01-05 NOTE — Telephone Encounter (Signed)
At first pt said "no more experimenting, the clonazepam works" but when I told pt you don't prescribe clonazepam for sleep and you would send something else in for sleep he said I will try it one night and if it doesn't work no more experimenting.

## 2020-01-05 NOTE — Telephone Encounter (Signed)
Pt called to let Britney know that he trashed the Trazodone Rx because they didn't help him sleep, even when doubling up on taking them. Pt said he will keep using the Clonazepam Rx.

## 2020-01-05 NOTE — Telephone Encounter (Signed)
I can send another medication to help with sleep, but we will continue weaning clonazepam as I do not give this for sleep.

## 2020-01-05 NOTE — Telephone Encounter (Signed)
I sent temazepam.

## 2020-01-08 NOTE — Telephone Encounter (Signed)
Patient aware and states that he was able to sleep last night

## 2020-01-16 ENCOUNTER — Other Ambulatory Visit: Payer: Self-pay

## 2020-01-16 ENCOUNTER — Ambulatory Visit (INDEPENDENT_AMBULATORY_CARE_PROVIDER_SITE_OTHER): Payer: Medicare HMO | Admitting: Family Medicine

## 2020-01-16 ENCOUNTER — Encounter: Payer: Self-pay | Admitting: Family Medicine

## 2020-01-16 VITALS — BP 142/61 | HR 94 | Temp 97.9°F | Ht 69.5 in | Wt 189.0 lb

## 2020-01-16 DIAGNOSIS — F5101 Primary insomnia: Secondary | ICD-10-CM | POA: Diagnosis not present

## 2020-01-16 DIAGNOSIS — J449 Chronic obstructive pulmonary disease, unspecified: Secondary | ICD-10-CM | POA: Diagnosis not present

## 2020-01-16 MED ORDER — TEMAZEPAM 15 MG PO CAPS: 15.0000 mg | ORAL_CAPSULE | Freq: Every evening | ORAL | 0 refills | Status: DC | PRN

## 2020-01-16 NOTE — Progress Notes (Signed)
Assessment & Plan:  1. Primary insomnia - Patient is going to continue trying temazepam.  2. COPD mixed type [facility]) - Patient he did not like Breztri.  He will continue Symbicort and keep follow-up appointments with his pulmonologist.   Return in about 3 weeks (around 02/06/2020) for sleep (telephone).  Hendricks Limes, MSN, APRN, FNP-C [facility]  Subjective:    Patient ID: [name], male    DOB: 1936-06-16, 84 y.o.   MRN: [id number]  Patient Care Team: Loman Brooklyn, FNP as PCP - General (Family Medicine) Danie Binder, MD (Inactive) (Gastroenterology)   Chief Complaint:  Chief Complaint  Patient presents with  . Insomnia    4 week follow up.  Patient states he has not slept in 32 hours.    HPI: [name] is a 84 y.o. male presenting on 01/16/2020 for Insomnia (4 week follow up.  Patient states he has not slept in 32 hours.)  Patient is here for a follow-up of insomnia.  He was previously taking clonazepam 1 mg at bedtime to help with sleep.  He has previously failed therapy with Ambien.  He tried trazodone at first to wean off of clonazepam but states it did not work at all.  He was then prescribed temazepam which he states worked well when he took it every other day alternating with his clonazepam.  He ran out of clonazepam and took temazepam for 3 days in a row.  He states he slept well for the first 2 days but last night he did not sleep.  He is not sure it was the temazepam that was not working yesterday or if it was because he went out to [facility] and got very hot therefore feeling bad for the rest of the day.  Patient did try Breztri for his COPD but states he likes the Symbicort better.  New complaints: None  Social history:  Relevant past medical, surgical, family and social history reviewed and updated as indicated. Interim medical history since our last visit reviewed.  Allergies and medications reviewed and updated.  DATA  REVIEWED: CHART IN EPIC  ROS: Negative unless specifically indicated above in HPI.    Current Outpatient Medications:  .  albuterol (PROVENTIL HFA;VENTOLIN HFA) 108 (90 BASE) MCG/ACT inhaler, Inhale 2 puffs into the lungs every 6 (six) hours as needed.  , Disp: , Rfl:  .  alendronate (FOSAMAX) 70 MG tablet, Take 1 tablet (70 mg total) by mouth once a week. Take with a full glass of water on an empty stomach., Disp: 12 tablet, Rfl: 1 .  aspirin 81 MG tablet, Take 81 mg by mouth daily.  , Disp: , Rfl:  .  Budeson-Glycopyrrol-Formoterol (BREZTRI AEROSPHERE) 160-9-4.8 MCG/ACT AERO, Inhale 2 puffs into the lungs 2 (two) times daily., Disp: 10.7 g, Rfl: 5 .  Budeson-Glycopyrrol-Formoterol (BREZTRI AEROSPHERE) 160-9-4.8 MCG/ACT AERO, Inhale 2 puffs into the lungs 2 (two) times daily., Disp: 28 g, Rfl: 0 .  budesonide-formoterol (SYMBICORT) 160-4.5 MCG/ACT inhaler, Inhale 2 puffs into the lungs 2 (two) times daily., Disp: , Rfl:  .  doxazosin (CARDURA) 4 MG tablet, Take 1 tablet (4 mg total) by mouth at bedtime., Disp: 90 tablet, Rfl: 1 .  ergocalciferol (VITAMIN D2) 1.25 MG (50000 UT) capsule, Take 1 capsule (50,000 Units total) by mouth once a week. Takes on monday, Disp: 5 capsule, Rfl: 5 .  hydrochlorothiazide (HYDRODIURIL) 25 MG tablet, Take 1 tablet (25 mg total) by mouth every morning., Disp:  90 tablet, Rfl: 1 .  leuprolide (LUPRON) 22.5 MG injection, Inject 22.5 mg into the muscle every 3 (three) months., Disp: , Rfl:  .  losartan (COZAAR) 100 MG tablet, Take 1 tablet (100 mg total) by mouth every morning., Disp: 90 tablet, Rfl: 1 .  MAGNESIUM PO, Take 500 mg by mouth daily., Disp: , Rfl:  .  Melatonin 12 MG TABS, Take 1 tablet by mouth at bedtime., Disp: 90 tablet, Rfl: 3 .  Multiple Vitamin (MULTIVITAMIN) capsule, Take 1 capsule by mouth daily.  , Disp: , Rfl:  .  Omega-3 Fatty Acids (FISH OIL) 1200 MG CAPS, Take 1 capsule by mouth daily., Disp: , Rfl:  .  omeprazole (PRILOSEC) 20 MG capsule,  Take 1 capsule (20 mg total) by mouth every morning., Disp: 90 capsule, Rfl: 1 .  temazepam (RESTORIL) 15 MG capsule, Take 1 capsule (15 mg total) by mouth at bedtime as needed for sleep., Disp: 10 capsule, Rfl: 0   Allergies  Allergen Reactions  . Diclofenac Shortness Of Breath and Swelling  . Guaifenesin Er Other (See Comments)    dizzy  . Roflumilast Palpitations    Heart race fast, depressed   Past Medical History:  Diagnosis Date  . Arthritis    neck, back, feet and ankles, feet and ankles swell due to arthritis, prop feet up swelling goes down  . Bronchiectasis without complication ([place])    bronchitis oct 2019 got clearance for surgery  . COPD (chronic obstructive pulmonary disease) with chronic bronchitis ([place])    pulmologist-  dr Tarri Fuller young  . Dyspnea    with heavy exertion  . GERD (gastroesophageal reflux disease)   . History of colon polyps    2007--  simple adenoma  . History of gastritis    SECONDARY TO ADVIL  . History of skin cancer    EXCISION LEFT SIDE OF FACE and neck  . Hypertension   . Insomnia   . Nocturia   . Prostate cancer [facility])    dx  2009--  XRT takine lupron shots for last shot oct 2019    Past Surgical History:  Procedure Laterality Date  . CATARACT EXTRACTION W/ INTRAOCULAR LENS  IMPLANT, [place]  . COLONOSCOPY  JUL 2007 D25 V1   Simple adenomas(RECTUM), HYPERPLASTIC COLON POLYPS, [place] TICS  . COLONOSCOPY  03/18/2011   Procedure: COLONOSCOPY;  Surgeon: Dorothyann Peng, MD;  [facility];  Service: Endoscopy;  Laterality: N/A;  8:30  . CRYOABLATION N/A 12/29/2013   Procedure: CRYO ABLATION PROSTATE;  Surgeon: Arvil Persons, MD;  Location: [facility];  Service: Urology;  Laterality: N/A;  . CYSTOSCOPY N/A 04/29/2018   Procedure: Erlene Quan;  Surgeon: Festus Aloe, MD;  Location: [facility];  Service: Urology;  Laterality: N/A;  . skin cancers removed  2018   left arm  . UPPER  GASTROINTESTINAL ENDOSCOPY  2007 DYSPEPSIA D50 V4   NSAID GASTRITIS    Social History   Socioeconomic History  . Marital status: Divorced    Spouse name: Not on file  . Number of children: Not on file  . Years of education: Not on file  . Highest education level: Not on file  Occupational History  . Not on file  Tobacco Use  . Smoking status: Former Smoker    Packs/day: 1.50    Years: 30.00    Pack years: 45.00    Types: Cigarettes    Quit date:  06/23/1979    Years since quitting: 40.5  . Smokeless tobacco: Never Used  Vaping Use  . Vaping Use: Never used  Substance and Sexual Activity  . Alcohol use: Yes    Comment: occ whiskey helps with bronchitis  . Drug use: No  . Sexual activity: Not on file  Other Topics Concern  . Not on file  Social History Narrative  . Not on file   Social Determinants of Health   Financial Resource Strain:   . Difficulty of Paying Living Expenses:   Food Insecurity:   . Worried About Charity fundraiser in the Last Year:   . Arboriculturist in the Last Year:   Transportation Needs:   . Film/video editor (Medical):   Marland Kitchen Lack of Transportation (Non-Medical):   Physical Activity:   . Days of Exercise per Week:   . Minutes of Exercise per Session:   Stress:   . Feeling of Stress :   Social Connections:   . Frequency of Communication with Friends and Family:   . Frequency of Social Gatherings with Friends and Family:   . Attends Religious Services:   . Active Member of Clubs or Organizations:   . Attends Archivist Meetings:   Marland Kitchen Marital Status:   Intimate Partner Violence:   . Fear of Current or Ex-Partner:   . Emotionally Abused:   Marland Kitchen Physically Abused:   . Sexually Abused:         Objective:    BP (!) 148/64   Pulse 94   Temp 97.9 F (36.6 C) (Temporal)   Ht 5' 9.5" (1.765 m)   Wt 189 lb (85.7 kg)   SpO2 96%   BMI 27.51 kg/m   Wt Readings from Last 3 Encounters:  01/16/20 189 lb (85.7 kg)  12/26/19 191  lb (86.6 kg)  12/15/19 191 lb 9.6 oz (86.9 kg)    Physical Exam Vitals reviewed.  Constitutional:      General: He is not in acute distress.    Appearance: Normal appearance. He is overweight. He is not ill-appearing, toxic-appearing or diaphoretic.  HENT:     Head: Normocephalic and atraumatic.  Eyes:     General: No scleral icterus.       Right eye: No discharge.        Left eye: No discharge.     Conjunctiva/sclera: Conjunctivae normal.  Cardiovascular:     Rate and Rhythm: Normal rate and regular rhythm.     Heart sounds: Normal heart sounds. No murmur heard.  No friction rub. No gallop.   Pulmonary:     Effort: Pulmonary effort is normal. No respiratory distress.     Breath sounds: Normal breath sounds. No stridor. No wheezing, rhonchi or rales.  Musculoskeletal:        General: Normal range of motion.     Cervical back: Normal range of motion.  Skin:    General: Skin is warm and dry.  Neurological:     Mental Status: He is alert and oriented to person, place, and time. Mental status is at baseline.  Psychiatric:        Mood and Affect: Mood normal.        Behavior: Behavior normal.        Thought Content: Thought content normal.        Judgment: Judgment normal.     Lab Results  Component Value Date   TSH 3.670 06/13/2019   Lab Results  Component  Value Date   WBC 8.0 06/13/2019   HGB 15.0 06/13/2019   HCT 42.9 06/13/2019   MCV 93 06/13/2019   PLT 180 06/13/2019   Lab Results  Component Value Date   NA 139 12/15/2019   K 4.0 12/15/2019   CO2 27 12/15/2019   GLUCOSE 93 12/15/2019   BUN 22 12/15/2019   CREATININE 1.03 12/15/2019   BILITOT 0.3 12/15/2019   ALKPHOS 71 12/15/2019   AST 20 12/15/2019   ALT 17 12/15/2019   PROT 6.2 12/15/2019   ALBUMIN 3.9 12/15/2019   CALCIUM 9.6 12/15/2019   Lab Results  Component Value Date   CHOL 208 (H) 06/13/2019   Lab Results  Component Value Date   HDL 56 06/13/2019   Lab Results  Component Value Date    LDLCALC 125 (H) 06/13/2019   Lab Results  Component Value Date   TRIG 155 (H) 06/13/2019   Lab Results  Component Value Date   CHOLHDL 3.7 06/13/2019   No results found for: HGBA1C

## 2020-01-17 ENCOUNTER — Other Ambulatory Visit: Payer: Self-pay | Admitting: *Deleted

## 2020-01-17 DIAGNOSIS — E559 Vitamin D deficiency, unspecified: Secondary | ICD-10-CM

## 2020-01-19 ENCOUNTER — Encounter: Payer: Self-pay | Admitting: Family Medicine

## 2020-01-24 ENCOUNTER — Telehealth: Payer: Self-pay | Admitting: Family Medicine

## 2020-01-24 DIAGNOSIS — F5101 Primary insomnia: Secondary | ICD-10-CM

## 2020-01-24 MED ORDER — ESZOPICLONE 1 MG PO TABS: 1.0000 mg | ORAL_TABLET | Freq: Every evening | ORAL | 0 refills | Status: DC | PRN

## 2020-01-24 NOTE — Telephone Encounter (Signed)
Please let patient know he can stop the temazepam. I sent Lunesta for him to try.

## 2020-01-24 NOTE — Telephone Encounter (Signed)
Pt called to leave a message for Britney to let her know that his machine he uses for Insomnia is not helping. Pt only getting 2 hrs of sleep at night.

## 2020-01-24 NOTE — Telephone Encounter (Signed)
Patient aware and verbalized understanding. °

## 2020-01-30 ENCOUNTER — Telehealth: Payer: Self-pay | Admitting: Family Medicine

## 2020-01-30 NOTE — Telephone Encounter (Signed)
Increase to 2 mg.

## 2020-01-30 NOTE — Telephone Encounter (Signed)
Left message for pt to return call.

## 2020-01-30 NOTE — Telephone Encounter (Signed)
Pt called to let Britney know that the sleeping medicine she prescribed to him last week is not working. The only difference is this medicine and what he was previously taking is that this medicine cost a lot more than the previous medicine. Pt says he takes the medicine around 2AM and is wide awake by 3:30AM.

## 2020-01-30 NOTE — Telephone Encounter (Signed)
Patient informed. Instructed patient to take medication 30 minutes to one hour before bedtime. No electronic devices if he does wake up. Pt admits to using cell phone during the night. He knows to be active during the day. No naps.

## 2020-02-08 ENCOUNTER — Ambulatory Visit (INDEPENDENT_AMBULATORY_CARE_PROVIDER_SITE_OTHER): Payer: Medicare HMO | Admitting: Family Medicine

## 2020-02-08 DIAGNOSIS — F5101 Primary insomnia: Secondary | ICD-10-CM

## 2020-02-08 NOTE — Progress Notes (Signed)
Virtual Visit via Telephone Note  I connected with [name] on 02/11/20 at 8:56 AM by telephone and verified that I am speaking with the correct person using two identifiers. [name] is currently located at home and nobody is currently with him during this visit. The provider, Loman Brooklyn, FNP is located in their home at time of visit.  I discussed the limitations, risks, security and privacy concerns of performing an evaluation and management service by telephone and the availability of in person appointments. I also discussed with the patient that there may be a patient responsible charge related to this service. The patient expressed understanding and agreed to proceed.  Subjective: PCP: Loman Brooklyn, FNP  Chief Complaint  Patient presents with  . Insomnia   Patient reports he is currently taking Lunesta at 1 AM when he can't get back to sleep and again at 3 AM when he wakes up and can't get back to sleep. He reports he spends the first couple of hours coughing due to chronic bronchitis/bronchiectasis COPD. He reports when he was taking the clonazepam he was able to go to sleep and stay asleep. He has not been able to sleep with any of the other medications he has been given for sleep due to staying awake coughing. He is only getting ~4 hours of sleep per night.    ROS: Per HPI  Current Outpatient Medications:  .  albuterol (PROVENTIL HFA;VENTOLIN HFA) 108 (90 BASE) MCG/ACT inhaler, Inhale 2 puffs into the lungs every 6 (six) hours as needed.  , Disp: , Rfl:  .  alendronate (FOSAMAX) 70 MG tablet, Take 1 tablet (70 mg total) by mouth once a week. Take with a full glass of water on an empty stomach., Disp: 12 tablet, Rfl: 1 .  aspirin 81 MG tablet, Take 81 mg by mouth daily.  , Disp: , Rfl:  .  Budeson-Glycopyrrol-Formoterol (BREZTRI AEROSPHERE) 160-9-4.8 MCG/ACT AERO, Inhale 2 puffs into the lungs 2 (two) times daily., Disp: 10.7 g, Rfl: 5 .   Budeson-Glycopyrrol-Formoterol (BREZTRI AEROSPHERE) 160-9-4.8 MCG/ACT AERO, Inhale 2 puffs into the lungs 2 (two) times daily., Disp: 28 g, Rfl: 0 .  budesonide-formoterol (SYMBICORT) 160-4.5 MCG/ACT inhaler, Inhale 2 puffs into the lungs 2 (two) times daily., Disp: , Rfl:  .  doxazosin (CARDURA) 4 MG tablet, Take 1 tablet (4 mg total) by mouth at bedtime., Disp: 90 tablet, Rfl: 1 .  ergocalciferol (VITAMIN D2) 1.25 MG (50000 UT) capsule, Take 1 capsule (50,000 Units total) by mouth once a week. Takes on monday, Disp: 5 capsule, Rfl: 5 .  eszopiclone (LUNESTA) 1 MG TABS tablet, Take 1 tablet (1 mg total) by mouth at bedtime as needed for sleep. Take immediately before bedtime, Disp: 30 tablet, Rfl: 0 .  hydrochlorothiazide (HYDRODIURIL) 25 MG tablet, Take 1 tablet (25 mg total) by mouth every morning., Disp: 90 tablet, Rfl: 1 .  leuprolide (LUPRON) 22.5 MG injection, Inject 22.5 mg into the muscle every 3 (three) months., Disp: , Rfl:  .  losartan (COZAAR) 100 MG tablet, Take 1 tablet (100 mg total) by mouth every morning., Disp: 90 tablet, Rfl: 1 .  MAGNESIUM PO, Take 500 mg by mouth daily., Disp: , Rfl:  .  Melatonin 12 MG TABS, Take 1 tablet by mouth at bedtime., Disp: 90 tablet, Rfl: 3 .  Multiple Vitamin (MULTIVITAMIN) capsule, Take 1 capsule by mouth daily.  , Disp: , Rfl:  .  Omega-3 Fatty Acids (FISH OIL) 1200  MG CAPS, Take 1 capsule by mouth daily., Disp: , Rfl:  .  omeprazole (PRILOSEC) 20 MG capsule, Take 1 capsule (20 mg total) by mouth every morning., Disp: 90 capsule, Rfl: 1  Allergies  Allergen Reactions  . Diclofenac Shortness Of Breath and Swelling  . Guaifenesin Er Other (See Comments)    dizzy  . Roflumilast Palpitations    Heart race fast, depressed   Past Medical History:  Diagnosis Date  . Arthritis    neck, back, feet and ankles, feet and ankles swell due to arthritis, prop feet up swelling goes down  . Bronchiectasis without complication ([place])    bronchitis oct 2019  got clearance for surgery  . COPD (chronic obstructive pulmonary disease) with chronic bronchitis ([place])    pulmologist-  dr Tarri Fuller young  . Dyspnea    with heavy exertion  . GERD (gastroesophageal reflux disease)   . History of colon polyps    2007--  simple adenoma  . History of gastritis    SECONDARY TO ADVIL  . History of skin cancer    EXCISION LEFT SIDE OF FACE and neck  . Hypertension   . Insomnia   . Nocturia   . Prostate cancer [facility])    dx  2009--  XRT takine lupron shots for last shot oct 2019    Observations/Objective: A&O  No respiratory distress or wheezing audible over the phone Mood, judgement, and thought processes all WNL   Assessment and Plan: 1. Primary insomnia - Patient is going to take Lunesta 2 mg at bedtime and see if this helps him sleep through the night. I asked that he call me on Monday and let me know how his weekend went. If he is still not sleeping, I am giving him the clonazepam back due to its antitussive effects and quality of life. He has failed therapy with Trazodone, Ambien, and Temazepam.    Follow Up Instructions:  I discussed the assessment and treatment plan with the patient. The patient was provided an opportunity to ask questions and all were answered. The patient agreed with the plan and demonstrated an understanding of the instructions.   The patient was advised to call back or seek an in-person evaluation if the symptoms worsen or if the condition fails to improve as anticipated.  The above assessment and management plan was discussed with the patient. The patient verbalized understanding of and has agreed to the management plan. Patient is aware to call the clinic if symptoms persist or worsen. Patient is aware when to return to the clinic for a follow-up visit. Patient educated on when it is appropriate to go to the emergency department.   Time call ended: 9:05 AM  I provided 11 minutes of non-face-to-face time during this  encounter.  Hendricks Limes, MSN, APRN, FNP-C [facility] 02/11/20

## 2020-02-11 ENCOUNTER — Encounter: Payer: Self-pay | Admitting: Family Medicine

## 2020-02-12 ENCOUNTER — Telehealth: Payer: Self-pay | Admitting: Family Medicine

## 2020-02-12 MED ORDER — CLONAZEPAM 0.5 MG PO TABS
0.5000 mg | ORAL_TABLET | Freq: Every day | ORAL | 2 refills | Status: DC
Start: 2020-02-12 — End: 2020-06-27

## 2020-02-12 NOTE — Telephone Encounter (Signed)
Please let patient know he can stop the Lunesta.  I have sent the clonazepam back to the pharmacy for him to take at bedtime.  I feel with its antitussive effects it is more of a benefit than a risk to him and also improves his quality of life.

## 2020-02-12 NOTE — Telephone Encounter (Signed)
Patient aware and verbalizes understanding. 

## 2020-03-13 NOTE — Assessment & Plan Note (Signed)
Chronic bronchitis/ bronchiectasis. Plan- samples Breztri, continue efforts at pulmonary toilet

## 2020-03-13 NOTE — Assessment & Plan Note (Signed)
We have discussed possibility his chronic lung disease began with recurrent aspiration in the past. Plan- continue reflux precautions.

## 2020-03-20 ENCOUNTER — Other Ambulatory Visit: Payer: Self-pay

## 2020-03-20 ENCOUNTER — Other Ambulatory Visit: Payer: Self-pay | Admitting: Family Medicine

## 2020-03-20 DIAGNOSIS — E559 Vitamin D deficiency, unspecified: Secondary | ICD-10-CM

## 2020-03-20 MED ORDER — ERGOCALCIFEROL 1.25 MG (50000 UT) PO CAPS: 50000.0000 [IU] | ORAL_CAPSULE | ORAL | 5 refills | Status: AC

## 2020-04-30 DIAGNOSIS — C61 Malignant neoplasm of prostate: Secondary | ICD-10-CM | POA: Diagnosis not present

## 2020-05-02 DIAGNOSIS — Z6828 Body mass index (BMI) 28.0-28.9, adult: Secondary | ICD-10-CM | POA: Diagnosis not present

## 2020-05-02 DIAGNOSIS — M47812 Spondylosis without myelopathy or radiculopathy, cervical region: Secondary | ICD-10-CM | POA: Diagnosis not present

## 2020-05-02 DIAGNOSIS — J449 Chronic obstructive pulmonary disease, unspecified: Secondary | ICD-10-CM | POA: Diagnosis not present

## 2020-05-02 DIAGNOSIS — C61 Malignant neoplasm of prostate: Secondary | ICD-10-CM | POA: Diagnosis not present

## 2020-05-02 DIAGNOSIS — I10 Essential (primary) hypertension: Secondary | ICD-10-CM | POA: Diagnosis not present

## 2020-05-02 DIAGNOSIS — M47816 Spondylosis without myelopathy or radiculopathy, lumbar region: Secondary | ICD-10-CM | POA: Diagnosis not present

## 2020-05-07 DIAGNOSIS — C61 Malignant neoplasm of prostate: Secondary | ICD-10-CM | POA: Diagnosis not present

## 2020-05-07 DIAGNOSIS — N3941 Urge incontinence: Secondary | ICD-10-CM | POA: Diagnosis not present

## 2020-05-07 DIAGNOSIS — R9721 Rising PSA following treatment for malignant neoplasm of prostate: Secondary | ICD-10-CM | POA: Diagnosis not present

## 2020-05-08 ENCOUNTER — Other Ambulatory Visit: Payer: Self-pay | Admitting: Family Medicine

## 2020-05-08 DIAGNOSIS — N401 Enlarged prostate with lower urinary tract symptoms: Secondary | ICD-10-CM

## 2020-05-08 DIAGNOSIS — R350 Frequency of micturition: Secondary | ICD-10-CM

## 2020-05-08 DIAGNOSIS — I10 Essential (primary) hypertension: Secondary | ICD-10-CM

## 2020-05-09 ENCOUNTER — Other Ambulatory Visit: Payer: Self-pay | Admitting: Family Medicine

## 2020-05-09 DIAGNOSIS — N401 Enlarged prostate with lower urinary tract symptoms: Secondary | ICD-10-CM

## 2020-05-09 DIAGNOSIS — K219 Gastro-esophageal reflux disease without esophagitis: Secondary | ICD-10-CM

## 2020-06-14 DIAGNOSIS — J189 Pneumonia, unspecified organism: Secondary | ICD-10-CM | POA: Diagnosis not present

## 2020-06-26 NOTE — Progress Notes (Signed)
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  Walked 3 laps = 555 feet. Office Spirometry 04/14/2018-moderately severe obstructive airways disease, FVC 2.3/58%, FEV1 1.5/53%, ratio 0.66, FEF 25-75% 0.9/45% -------------------------------------------------------------------------------------------------------------   12/26/19- 85 year old male [place] former smoker followed for chronic bronchitis/bronchiectasis COPD, insomnia, complicated by HBP, chronic gastritis, GERD, atherosclerosis, prostate cancer Spiriva HandiHaler, Symbicort 160, albuterol HFA, Covid vax- 2 Moderna Less cough and sputum lately. More aware of DOE esp with mask on.  No fever, blood or acute issues.   06/27/20- 85 year old male [place] former smoker followed for chronic bronchitis/bronchiectasis COPD, insomnia, complicated by HBP, chronic gastritis, GERD, atherosclerosis, prostate cancer Spiriva HandiHaler, Symbicort 160, albuterol HFA, Seen UC 12/24- RLL pneumonia   Rxd amoxil 875 bid, pred taper Covid vax-3 Moderna Flu vax- had -----Patient was on Symbicort and was switch to Advair by [facility] but it is not helping him. Patient has shortness of breath since switching. Dry/productive cough with some clear/yellow sputum.  He prefers Symbicort. Had clinical dx pneumonia- no CXR. Treated with prednisone and amoxacillin. Still feels more DOE than usual with increased need for rescue hfa 2-3x/day. No fever or purulent now.   ROS-See HPI  + = positive Constitutional:   No-   weight loss, night sweats, fevers, chills,  fatigue, +lassitude. HEENT:   No-  headaches, difficulty swallowing, tooth/dental problems, sore throat,       No-  sneezing, itching, ear ache,     no current- nasal congestion, post nasal drip,  CV:  No-chest pain, orthopnea, PND, swelling in lower extremities, anasarca, dizziness, palpitations Resp: + shortness of breath with exertion or at rest.                +productive cough,  + non-productive cough,  No- coughing up of blood.              -change in color of mucus.  No- wheezing.   Skin: No-   rash or lesions. GI: + heartburn, indigestion,  No-abdominal pain, nausea, vomiting,  GU:  MS:  No-   joint pain or swelling.    + back pain. Neuro-     nothing unusual Psych:  No- change in mood or affect. No depression or anxiety.  No memory loss.  OBJ General- Alert, Oriented, Affect-appropriate, Distress- none acute; +overweight Skin- rash-none, lesions- none, excoriation- none Lymphadenopathy- none Head- atraumatic            Eyes- Gross vision intact, PERRLA, conjunctivae clear secretions            Ears- Hearing, canals-normal            Nose- Clear, no-Septal dev, mucus, polyps, erosion, perforation             Throat- Mallampati III , mucosa cobblestoned , drainage- none, tonsils- atrophic Neck- flexible , trachea midline, no stridor , thyroid nl, carotid no bruit Chest - symmetrical excursion , unlabored           Heart/CV- RRR , no murmur , no gallop  , no rub, nl s1 s2                           -  JVD- none , edema- none, stasis changes- none, varices- none           Lung-+crackles ,+ slow exhalation, Unlabored , dullness-none, rub- none,            Chest wall-  Abd-  Br/ Gen/ Rectal- Not done, not indicated Extrem- cyanosis- none, clubbing, none, atrophy- none, strength- nl Neuro- grossly intact to observation

## 2020-06-27 ENCOUNTER — Ambulatory Visit (INDEPENDENT_AMBULATORY_CARE_PROVIDER_SITE_OTHER): Payer: Medicare HMO

## 2020-06-27 ENCOUNTER — Ambulatory Visit: Payer: Medicare HMO | Admitting: Internal Medicine

## 2020-06-27 ENCOUNTER — Other Ambulatory Visit: Payer: Self-pay

## 2020-06-27 ENCOUNTER — Encounter: Payer: Self-pay | Admitting: Internal Medicine

## 2020-06-27 VITALS — BP 136/62 | HR 85 | Temp 97.3°F | Ht 69.0 in | Wt 188.6 lb

## 2020-06-27 DIAGNOSIS — J471 Bronchiectasis with (acute) exacerbation: Secondary | ICD-10-CM

## 2020-06-27 DIAGNOSIS — J449 Chronic obstructive pulmonary disease, unspecified: Secondary | ICD-10-CM

## 2020-06-27 DIAGNOSIS — J479 Bronchiectasis, uncomplicated: Secondary | ICD-10-CM | POA: Diagnosis not present

## 2020-06-27 MED ORDER — BREO ELLIPTA 100-25 MCG/INH IN AEPB: 1.0000 | INHALATION_SPRAY | Freq: Every day | RESPIRATORY_TRACT | 0 refills | Status: DC

## 2020-06-27 NOTE — Patient Instructions (Signed)
Order- CXR   exacerbation of bronchiectasis  Sample x 2 Breo 100     Inhale 1 puff then rinse mouth, once daily    Compare this to your experience with Symbicort and Advair.  If you still prefer Symbicort let us know. If the retail pharmacy cost is too high, we can see if you qualify for manufacturer's assistance.

## 2020-06-28 ENCOUNTER — Encounter: Payer: Self-pay | Admitting: Internal Medicine

## 2020-06-28 NOTE — Assessment & Plan Note (Signed)
[facility] might cover Breo. If he still prefers Symbicort, though [facility] now wants Advair, we can try for manufacturer's assistance, but not sure how that will go.

## 2020-06-28 NOTE — Assessment & Plan Note (Signed)
He has improved after treatment for pneumonia. Plan- CXR

## 2020-07-22 ENCOUNTER — Telehealth: Payer: Self-pay | Admitting: Internal Medicine

## 2020-07-22 NOTE — Telephone Encounter (Signed)
Called and spoke with Patient.  Patient stated Memory Dance is not working good for him.  Patient stated he has been using Breo every morning, but it doesn't last long. Patient stated after a few hours he is using his rescue inhaler more. Patient stated [facility], and [facility] said [facility] could try and get his Symbicort filled, but it is doubtful.  Patient stated the [place] request a prescription be faxed to [phone], with name, dob, and social security number on fax. Patient stated he would need a prescription sent to [facility], also. Patient is requesting Symbicort or whatever inhaler Dr. Annamaria Boots thinks will help.  Message routed to Dr. Annamaria Boots  Allergies  Allergen Reactions  . Diclofenac Shortness Of Breath and Swelling  . Guaifenesin Er Other (See Comments)    dizzy  . Roflumilast Palpitations    Heart race fast, depressed   Current Outpatient Medications on File Prior to Visit  Medication Sig Dispense Refill  . albuterol (PROVENTIL HFA;VENTOLIN HFA) 108 (90 BASE) MCG/ACT inhaler Inhale 2 puffs into the lungs every 6 (six) hours as needed.    Marland Kitchen alendronate (FOSAMAX) 70 MG tablet Take 1 tablet (70 mg total) by mouth once a week. Take with a full glass of water on an empty stomach. 12 tablet 1  . aspirin 81 MG tablet Take 81 mg by mouth daily.    Marland Kitchen doxazosin (CARDURA) 4 MG tablet TAKE 1 TABLET (4 MG TOTAL) BY MOUTH AT BEDTIME. 90 tablet 0  . ergocalciferol (VITAMIN D2) 1.25 MG (50000 UT) capsule Take 1 capsule (50,000 Units total) by mouth once a week. Takes on monday 5 capsule 5  . fluticasone furoate-vilanterol (BREO ELLIPTA) 100-25 MCG/INH AEPB Inhale 1 puff into the lungs daily. 28 each 0  . Fluticasone-Salmeterol (ADVAIR) 250-50 MCG/DOSE AEPB Inhale 1 puff into the lungs 2 (two) times daily.    . hydrochlorothiazide (HYDRODIURIL) 25 MG tablet Take 1 tablet (25 mg total) by mouth every morning. 90 tablet 1  . leuprolide (LUPRON) 22.5 MG injection Inject 22.5 mg into the muscle  every 3 (three) months.    Marland Kitchen losartan (COZAAR) 100 MG tablet TAKE 1 TABLET (100 MG TOTAL) BY MOUTH EVERY MORNING. 90 tablet 0  . MAGNESIUM PO Take 500 mg by mouth daily.    . Melatonin 12 MG TABS Take 1 tablet by mouth at bedtime. 90 tablet 3  . Multiple Vitamin (MULTIVITAMIN) capsule Take 1 capsule by mouth daily.    . Omega-3 Fatty Acids (FISH OIL) 1200 MG CAPS Take 1 capsule by mouth daily.    Marland Kitchen omeprazole (PRILOSEC) 20 MG capsule Take 1 capsule (20 mg total) by mouth every morning. 90 capsule 1   No current facility-administered medications on file prior to visit.

## 2020-07-22 NOTE — Telephone Encounter (Signed)
Comparable to Symbicort are Dulera, Advair 250, and Wixela 250. He can ask [facility] if they cover any of those. They are alternatives to Symbicort and Breo, so the [place] might cover one of them. Maybe he can check on that tomorrow and let us know, before we send a prescription to his [place].

## 2020-07-23 NOTE — Telephone Encounter (Signed)
ATC x1, left detailed vm per DPR regarding alternative to current inhalers.  Advised to return call to the office.

## 2020-07-24 ENCOUNTER — Other Ambulatory Visit: Payer: Self-pay | Admitting: Family Medicine

## 2020-07-24 DIAGNOSIS — I10 Essential (primary) hypertension: Secondary | ICD-10-CM

## 2020-07-24 NOTE — Telephone Encounter (Signed)
Called and spoke with pt letting him know the info stated by CY about the alternatives to Symbicort. Pt stated he would try to call [facility] tomorrow 2/3 and would let us know what they said. Nothing further needed at this time.

## 2020-07-24 NOTE — Telephone Encounter (Signed)
Patient is returning phone call. Patient phone number is [phone].

## 2020-07-24 NOTE — Telephone Encounter (Signed)
[name]. NTBS 6 mos ckup was to be in December mail order not sent 

## 2020-07-31 ENCOUNTER — Other Ambulatory Visit: Payer: Self-pay | Admitting: Family Medicine

## 2020-07-31 DIAGNOSIS — N401 Enlarged prostate with lower urinary tract symptoms: Secondary | ICD-10-CM

## 2020-08-01 ENCOUNTER — Other Ambulatory Visit: Payer: Self-pay | Admitting: *Deleted

## 2020-08-01 DIAGNOSIS — I10 Essential (primary) hypertension: Secondary | ICD-10-CM

## 2020-08-01 NOTE — Telephone Encounter (Signed)
[name]. NTBS 6 mos ckup was to be in December mail order not sent

## 2020-08-01 NOTE — Telephone Encounter (Signed)
Britney NTBS for 6 mos check up in February. Mail order sent

## 2020-08-21 ENCOUNTER — Other Ambulatory Visit: Payer: Self-pay | Admitting: Family Medicine

## 2020-08-21 DIAGNOSIS — I10 Essential (primary) hypertension: Secondary | ICD-10-CM

## 2020-08-21 NOTE — Telephone Encounter (Signed)
[name]. NTBS 6 mos follow-up was to be February. Mail order not sent

## 2020-08-22 NOTE — Telephone Encounter (Signed)
CALLED PATIENT TO SCHEDULE APPOINTMENT. PATIENT STATES HE HAS CHANGED PROVIDERS. NO LONGER PATIENT AT [facility].

## 2020-08-28 DIAGNOSIS — R9721 Rising PSA following treatment for malignant neoplasm of prostate: Secondary | ICD-10-CM | POA: Diagnosis not present

## 2020-08-30 DIAGNOSIS — M47812 Spondylosis without myelopathy or radiculopathy, cervical region: Secondary | ICD-10-CM | POA: Diagnosis not present

## 2020-08-30 DIAGNOSIS — C61 Malignant neoplasm of prostate: Secondary | ICD-10-CM | POA: Diagnosis not present

## 2020-08-30 DIAGNOSIS — M47816 Spondylosis without myelopathy or radiculopathy, lumbar region: Secondary | ICD-10-CM | POA: Diagnosis not present

## 2020-08-30 DIAGNOSIS — I10 Essential (primary) hypertension: Secondary | ICD-10-CM | POA: Diagnosis not present

## 2020-08-30 DIAGNOSIS — Z6828 Body mass index (BMI) 28.0-28.9, adult: Secondary | ICD-10-CM | POA: Diagnosis not present

## 2020-08-30 DIAGNOSIS — J449 Chronic obstructive pulmonary disease, unspecified: Secondary | ICD-10-CM | POA: Diagnosis not present

## 2020-09-04 DIAGNOSIS — R350 Frequency of micturition: Secondary | ICD-10-CM | POA: Diagnosis not present

## 2020-09-04 DIAGNOSIS — C61 Malignant neoplasm of prostate: Secondary | ICD-10-CM | POA: Diagnosis not present

## 2020-09-10 DIAGNOSIS — Z5111 Encounter for antineoplastic chemotherapy: Secondary | ICD-10-CM | POA: Diagnosis not present

## 2020-09-10 DIAGNOSIS — C61 Malignant neoplasm of prostate: Secondary | ICD-10-CM | POA: Diagnosis not present

## 2020-09-12 DIAGNOSIS — X32XXXD Exposure to sunlight, subsequent encounter: Secondary | ICD-10-CM | POA: Diagnosis not present

## 2020-09-12 DIAGNOSIS — D225 Melanocytic nevi of trunk: Secondary | ICD-10-CM | POA: Diagnosis not present

## 2020-09-12 DIAGNOSIS — L570 Actinic keratosis: Secondary | ICD-10-CM | POA: Diagnosis not present

## 2020-09-12 DIAGNOSIS — C44519 Basal cell carcinoma of skin of other part of trunk: Secondary | ICD-10-CM | POA: Diagnosis not present

## 2020-09-12 DIAGNOSIS — Z1283 Encounter for screening for malignant neoplasm of skin: Secondary | ICD-10-CM | POA: Diagnosis not present

## 2020-10-02 DIAGNOSIS — R059 Cough, unspecified: Secondary | ICD-10-CM | POA: Diagnosis not present

## 2020-10-02 DIAGNOSIS — Z20828 Contact with and (suspected) exposure to other viral communicable diseases: Secondary | ICD-10-CM | POA: Diagnosis not present

## 2020-10-02 DIAGNOSIS — M47816 Spondylosis without myelopathy or radiculopathy, lumbar region: Secondary | ICD-10-CM | POA: Diagnosis not present

## 2020-10-02 DIAGNOSIS — I10 Essential (primary) hypertension: Secondary | ICD-10-CM | POA: Diagnosis not present

## 2020-10-02 DIAGNOSIS — J441 Chronic obstructive pulmonary disease with (acute) exacerbation: Secondary | ICD-10-CM | POA: Diagnosis not present

## 2020-10-07 ENCOUNTER — Other Ambulatory Visit: Payer: Self-pay | Admitting: Family Medicine

## 2020-10-07 DIAGNOSIS — N401 Enlarged prostate with lower urinary tract symptoms: Secondary | ICD-10-CM

## 2020-10-07 DIAGNOSIS — R350 Frequency of micturition: Secondary | ICD-10-CM

## 2020-10-22 ENCOUNTER — Other Ambulatory Visit (HOSPITAL_COMMUNITY): Payer: Self-pay | Admitting: Nurse Practitioner

## 2020-10-22 DIAGNOSIS — Z13820 Encounter for screening for osteoporosis: Secondary | ICD-10-CM

## 2020-10-28 ENCOUNTER — Ambulatory Visit (HOSPITAL_COMMUNITY)
Admission: RE | Admit: 2020-10-28 | Discharge: 2020-10-28 | Disposition: A | Discharge status: Home / Self Care | Payer: Medicare HMO | Source: Ambulatory Visit | Attending: Nurse Practitioner | Admitting: Nurse Practitioner

## 2020-10-28 ENCOUNTER — Ambulatory Visit: Payer: Medicare HMO | Admitting: Internal Medicine

## 2020-10-28 DIAGNOSIS — M85852 Other specified disorders of bone density and structure, left thigh: Secondary | ICD-10-CM | POA: Insufficient documentation

## 2020-10-28 DIAGNOSIS — Z13820 Encounter for screening for osteoporosis: Secondary | ICD-10-CM | POA: Diagnosis not present

## 2021-01-29 ENCOUNTER — Other Ambulatory Visit: Payer: Self-pay | Admitting: Family Medicine

## 2021-01-29 DIAGNOSIS — E559 Vitamin D deficiency, unspecified: Secondary | ICD-10-CM

## 2021-02-17 DIAGNOSIS — E7800 Pure hypercholesterolemia, unspecified: Secondary | ICD-10-CM | POA: Diagnosis not present

## 2021-02-17 DIAGNOSIS — H52229 Regular astigmatism, unspecified eye: Secondary | ICD-10-CM | POA: Diagnosis not present

## 2021-02-17 DIAGNOSIS — Z0100 Encounter for examination of eyes and vision without abnormal findings: Secondary | ICD-10-CM | POA: Diagnosis not present

## 2021-02-18 ENCOUNTER — Encounter: Payer: Self-pay | Admitting: *Deleted

## 2021-03-05 DIAGNOSIS — R9721 Rising PSA following treatment for malignant neoplasm of prostate: Secondary | ICD-10-CM | POA: Diagnosis not present

## 2021-03-10 DIAGNOSIS — D225 Melanocytic nevi of trunk: Secondary | ICD-10-CM | POA: Diagnosis not present

## 2021-03-10 DIAGNOSIS — L821 Other seborrheic keratosis: Secondary | ICD-10-CM | POA: Diagnosis not present

## 2021-03-10 DIAGNOSIS — Z08 Encounter for follow-up examination after completed treatment for malignant neoplasm: Secondary | ICD-10-CM | POA: Diagnosis not present

## 2021-03-10 DIAGNOSIS — L570 Actinic keratosis: Secondary | ICD-10-CM | POA: Diagnosis not present

## 2021-03-10 DIAGNOSIS — C4441 Basal cell carcinoma of skin of scalp and neck: Secondary | ICD-10-CM | POA: Diagnosis not present

## 2021-03-10 DIAGNOSIS — Z85828 Personal history of other malignant neoplasm of skin: Secondary | ICD-10-CM | POA: Diagnosis not present

## 2021-03-10 DIAGNOSIS — X32XXXD Exposure to sunlight, subsequent encounter: Secondary | ICD-10-CM | POA: Diagnosis not present

## 2021-03-13 DIAGNOSIS — J449 Chronic obstructive pulmonary disease, unspecified: Secondary | ICD-10-CM | POA: Diagnosis not present

## 2021-03-13 DIAGNOSIS — M47812 Spondylosis without myelopathy or radiculopathy, cervical region: Secondary | ICD-10-CM | POA: Diagnosis not present

## 2021-03-13 DIAGNOSIS — C61 Malignant neoplasm of prostate: Secondary | ICD-10-CM | POA: Diagnosis not present

## 2021-03-13 DIAGNOSIS — M810 Age-related osteoporosis without current pathological fracture: Secondary | ICD-10-CM | POA: Diagnosis not present

## 2021-03-13 DIAGNOSIS — M47816 Spondylosis without myelopathy or radiculopathy, lumbar region: Secondary | ICD-10-CM | POA: Diagnosis not present

## 2021-03-13 DIAGNOSIS — Z23 Encounter for immunization: Secondary | ICD-10-CM | POA: Diagnosis not present

## 2021-03-13 DIAGNOSIS — I10 Essential (primary) hypertension: Secondary | ICD-10-CM | POA: Diagnosis not present

## 2021-03-13 DIAGNOSIS — Z6828 Body mass index (BMI) 28.0-28.9, adult: Secondary | ICD-10-CM | POA: Diagnosis not present

## 2021-04-21 DIAGNOSIS — Z85828 Personal history of other malignant neoplasm of skin: Secondary | ICD-10-CM | POA: Diagnosis not present

## 2021-04-21 DIAGNOSIS — C4441 Basal cell carcinoma of skin of scalp and neck: Secondary | ICD-10-CM | POA: Diagnosis not present

## 2021-04-21 DIAGNOSIS — L570 Actinic keratosis: Secondary | ICD-10-CM | POA: Diagnosis not present

## 2021-04-21 DIAGNOSIS — X32XXXD Exposure to sunlight, subsequent encounter: Secondary | ICD-10-CM | POA: Diagnosis not present

## 2021-04-21 DIAGNOSIS — Z08 Encounter for follow-up examination after completed treatment for malignant neoplasm: Secondary | ICD-10-CM | POA: Diagnosis not present

## 2021-06-19 DIAGNOSIS — C4441 Basal cell carcinoma of skin of scalp and neck: Secondary | ICD-10-CM | POA: Diagnosis not present

## 2021-06-19 DIAGNOSIS — L821 Other seborrheic keratosis: Secondary | ICD-10-CM | POA: Diagnosis not present

## 2021-08-21 DIAGNOSIS — C4441 Basal cell carcinoma of skin of scalp and neck: Secondary | ICD-10-CM | POA: Diagnosis not present

## 2021-08-21 DIAGNOSIS — X32XXXD Exposure to sunlight, subsequent encounter: Secondary | ICD-10-CM | POA: Diagnosis not present

## 2021-08-21 DIAGNOSIS — Z08 Encounter for follow-up examination after completed treatment for malignant neoplasm: Secondary | ICD-10-CM | POA: Diagnosis not present

## 2021-08-21 DIAGNOSIS — Z85828 Personal history of other malignant neoplasm of skin: Secondary | ICD-10-CM | POA: Diagnosis not present

## 2021-08-21 DIAGNOSIS — L570 Actinic keratosis: Secondary | ICD-10-CM | POA: Diagnosis not present

## 2021-08-28 DIAGNOSIS — M47812 Spondylosis without myelopathy or radiculopathy, cervical region: Secondary | ICD-10-CM | POA: Diagnosis not present

## 2021-08-28 DIAGNOSIS — I10 Essential (primary) hypertension: Secondary | ICD-10-CM | POA: Diagnosis not present

## 2021-08-28 DIAGNOSIS — I4891 Unspecified atrial fibrillation: Secondary | ICD-10-CM | POA: Diagnosis not present

## 2021-08-28 DIAGNOSIS — M47816 Spondylosis without myelopathy or radiculopathy, lumbar region: Secondary | ICD-10-CM | POA: Diagnosis not present

## 2021-08-28 DIAGNOSIS — C61 Malignant neoplasm of prostate: Secondary | ICD-10-CM | POA: Diagnosis not present

## 2021-08-28 DIAGNOSIS — Z6828 Body mass index (BMI) 28.0-28.9, adult: Secondary | ICD-10-CM | POA: Diagnosis not present

## 2021-08-28 DIAGNOSIS — M810 Age-related osteoporosis without current pathological fracture: Secondary | ICD-10-CM | POA: Diagnosis not present

## 2021-08-28 DIAGNOSIS — J449 Chronic obstructive pulmonary disease, unspecified: Secondary | ICD-10-CM | POA: Diagnosis not present

## 2021-09-08 DIAGNOSIS — R9721 Rising PSA following treatment for malignant neoplasm of prostate: Secondary | ICD-10-CM | POA: Diagnosis not present

## 2021-10-02 DIAGNOSIS — L570 Actinic keratosis: Secondary | ICD-10-CM | POA: Diagnosis not present

## 2021-10-02 DIAGNOSIS — X32XXXD Exposure to sunlight, subsequent encounter: Secondary | ICD-10-CM | POA: Diagnosis not present

## 2021-10-02 DIAGNOSIS — B078 Other viral warts: Secondary | ICD-10-CM | POA: Diagnosis not present

## 2021-10-02 DIAGNOSIS — C4441 Basal cell carcinoma of skin of scalp and neck: Secondary | ICD-10-CM | POA: Diagnosis not present

## 2021-10-08 ENCOUNTER — Encounter: Payer: Self-pay | Admitting: *Deleted

## 2021-10-08 ENCOUNTER — Encounter: Payer: Self-pay | Admitting: Cardiology

## 2021-10-08 NOTE — Progress Notes (Signed)
? ? ?Cardiology Office Note ? ?Date: 10/09/2021  ? ?ID: [name], DOB July 16, 1935, MRN [id number] ? ?PCP:  Lavella Lemons, PA  ?Cardiologist:  Rozann Lesches, MD ?Electrophysiologist:  None  ? ?Chief Complaint  ?Patient presents with  ? Atrial Fibrillation  ? ? ?History of Present Illness: ?[name] is an 86 y.o. male referred for cardiology consultation by Mr. Georgianne Fick at [facility] for evaluation of recently documented atrial fibrillation.  I reviewed the office note from March at which point he was placed on Toprol-XL and Eliquis. ? ?He tells me that he has noticed over the last 2 years with his home blood pressure cuff that his heart rate increases as fast as 140 bpm at rest.  He has had less stamina during this time as well as an intermittent sense of palpitations.  It does not sound like he has had an an ECG until just recently. ? ?CHA2DS2-VASc score is 3. ? ?I personally reviewed his ECG today which shows atrial fibrillation with heart rate in the 80s.  He states that since being on Toprol-XL he has felt better, less sense of palpitations.  Also tolerating Eliquis without bleeding problems.  He has not had recent lab work however.  Also running out of current prescription. ? ?Past Medical History:  ?Diagnosis Date  ? Arthritis   ? neck, back, feet and ankles, feet and ankles swell due to arthritis, prop feet up swelling goes down  ? Bronchiectasis without complication ([place])   ? bronchitis oct 2019 got clearance for surgery  ? COPD (chronic obstructive pulmonary disease) with chronic bronchitis ([place])   ? pulmologist-  dr Tarri Fuller young  ? GERD (gastroesophageal reflux disease)   ? History of colon polyps   ? 2007--  simple adenoma  ? History of gastritis   ? SECONDARY TO ADVIL  ? History of skin cancer   ? EXCISION LEFT SIDE OF FACE and neck  ? Hypertension   ? Insomnia   ? Nocturia   ? Osteoporosis   ? Prostate cancer ([place])   ? dx  2009--  XRT takine lupron shots for last shot oct 2019  ? ? ?Past  Surgical History:  ?Procedure Laterality Date  ? CATARACT EXTRACTION W/ INTRAOCULAR LENS  IMPLANT, [place]  ? COLONOSCOPY  JUL 2007 D25 V1  ? Simple adenomas(RECTUM), HYPERPLASTIC COLON POLYPS, [place] TICS  ? COLONOSCOPY  03/18/2011  ? Procedure: COLONOSCOPY;  Surgeon: Dorothyann Peng, MD;  [facility];  Service: Endoscopy;  Laterality: N/A;  8:30  ? CRYOABLATION N/A 12/29/2013  ? Procedure: CRYO ABLATION PROSTATE;  Surgeon: Arvil Persons, MD;  Location: [facility];  Service: Urology;  Laterality: N/A;  ? CYSTOSCOPY N/A 04/29/2018  ? Procedure: Erlene Quan;  Surgeon: Festus Aloe, MD;  Location: [facility];  Service: Urology;  Laterality: N/A;  ? skin cancers removed  2018  ? left arm  ? UPPER GASTROINTESTINAL ENDOSCOPY  2007 DYSPEPSIA D50 V4  ? NSAID GASTRITIS  ? ? ?Current Outpatient Medications  ?Medication Sig Dispense Refill  ? albuterol (PROVENTIL HFA;VENTOLIN HFA) 108 (90 BASE) MCG/ACT inhaler Inhale 2 puffs into the lungs every 6 (six) hours as needed.    ? alendronate (FOSAMAX) 70 MG tablet Take 1 tablet (70 mg total) by mouth once a week. Take with a full glass of water on an empty stomach. 12 tablet 1  ? budesonide-formoterol (SYMBICORT) 160-4.5 MCG/ACT inhaler Inhale 2  puffs into the lungs 2 (two) times daily.    ? diphenhydramine-acetaminophen (TYLENOL PM) 25-500 MG TABS tablet Take 1 tablet by mouth at bedtime.    ? doxazosin (CARDURA) 4 MG tablet TAKE 1 TABLET AT BEDTIME 90 tablet 0  ? ergocalciferol (VITAMIN D2) 1.25 MG (50000 UT) capsule Take 1 capsule (50,000 Units total) by mouth once a week. Takes on monday 5 capsule 5  ? hydrochlorothiazide (HYDRODIURIL) 25 MG tablet Take 1 tablet (25 mg total) by mouth every morning. 90 tablet 1  ? leuprolide (LUPRON) 22.5 MG injection Inject 22.5 mg into the muscle every 3 (three) months.    ? losartan (COZAAR) 100 MG tablet TAKE 1 TABLET (100 MG TOTAL) BY MOUTH EVERY MORNING. 90 tablet 0  ?  MAGNESIUM PO Take 500 mg by mouth daily.    ? Melatonin 12 MG TABS Take 1 tablet by mouth at bedtime. 90 tablet 3  ? metoprolol succinate (TOPROL-XL) 50 MG 24 hr tablet Take 50 mg by mouth daily. Take with or immediately following a meal.    ? Multiple Vitamin (MULTIVITAMIN) capsule Take 1 capsule by mouth daily.    ? omeprazole (PRILOSEC) 20 MG capsule Take 1 capsule (20 mg total) by mouth every morning. 90 capsule 1  ? Red Yeast Rice 600 MG CAPS Take 25 capsules by mouth every morning.    ? apixaban (ELIQUIS) 5 MG TABS tablet Take 1 tablet (5 mg total) by mouth 2 (two) times daily. 60 tablet 6  ? ?No current facility-administered medications for this visit.  ? ?Allergies:  Diclofenac, Guaifenesin er, and Roflumilast  ? ?Social History: The patient  reports that he quit smoking about 42 years ago. His smoking use included cigarettes. He has a 45.00 pack-year smoking history. He has never used smokeless tobacco. He reports current alcohol use. He reports that he does not use drugs.  ? ?Family History: The patient's family history includes Prostate cancer in his father; Rectal cancer in his sister.  ? ?ROS: Mild ankle edema and arthritic symptoms.  No orthopnea or PND.  No exertional chest pain.  No syncope. ? ?Physical Exam: ?VS:  BP 128/84   Pulse 85   Ht '5\' 10"'$  (1.778 m)   Wt 184 lb 12.8 oz (83.8 kg)   SpO2 90%   BMI 26.52 kg/m? , BMI Body mass index is 26.52 kg/m?. ? ?Wt Readings from Last 3 Encounters:  ?10/09/21 184 lb 12.8 oz (83.8 kg)  ?06/27/20 188 lb 9.6 oz (85.5 kg)  ?01/16/20 189 lb (85.7 kg)  ?  ?General: Patient appears comfortable at rest. ?HEENT: Conjunctiva and lids normal, oropharynx clear. ?Neck: Supple, no elevated JVP or carotid bruits, no thyromegaly. ?Lungs: Clear to auscultation, nonlabored breathing at rest. ?Cardiac: Irregularly irregular, no S3, 1/6 systolic murmur, no pericardial rub. ?Abdomen: Soft, nontender, bowel sounds present. ?Extremities: Mild ankle edema, distal pulses  2+. ?Skin: Warm and dry. ?Musculoskeletal: No kyphosis. ?Neuropsychiatric: Alert and oriented x3, affect grossly appropriate. ? ?ECG:  An ECG dated 08/28/2021 was personally reviewed today and demonstrated:  Course atrial fibrillation with RVR. ? ?Recent Labwork: ?   ?Component Value Date/Time  ? CHOL 208 (H) 06/13/2019 1438  ? TRIG 155 (H) 06/13/2019 1438  ? HDL 56 06/13/2019 1438  ? CHOLHDL 3.7 06/13/2019 1438  ? LDLCALC 125 (H) 06/13/2019 1438  ? ?Other Studies Reviewed Today: ? ?No prior cardiac testing for review today. ? ?Assessment and Plan: ? ?1.  Persistent atrial fibrillation, possible duration over the last  few years and for now we will plan to manage as permanent atrial fibrillation with heart rate control and anticoagulation.  CHA2DS2-VASc score is 3.  Continue current doses of Toprol-XL and Eliquis which will be refilled.  We will schedule CBC, BMET, and an echocardiogram.  I did ask him to stop aspirin at this point. ? ?2.  Essential hypertension, also on Cozaar and hydrochlorothiazide.  Blood pressure is well controlled today. ? ?Medication Adjustments/Labs and Tests Ordered: ?Current medicines are reviewed at length with the patient today.  Concerns regarding medicines are outlined above.  ? ?Tests Ordered: ?Orders Placed This Encounter  ?Procedures  ? CBC  ? Basic metabolic panel  ? EKG 12-Lead  ? ECHOCARDIOGRAM COMPLETE  ? ? ?Medication Changes: ?Meds ordered this encounter  ?Medications  ? apixaban (ELIQUIS) 5 MG TABS tablet  ?  Sig: Take 1 tablet (5 mg total) by mouth 2 (two) times daily.  ?  Dispense:  60 tablet  ?  Refill:  6  ? ? ?Disposition:  Follow up  6 months. ? ?Signed, ?Satira Sark, MD, [facility] ?10/09/2021 9:25 AM    ?[place] [facility] ?[place], [place], [place] [address] ?Phone: [phone]; Fax: [phone]  ?

## 2021-10-09 ENCOUNTER — Ambulatory Visit: Payer: Medicare HMO | Admitting: Cardiology

## 2021-10-09 ENCOUNTER — Encounter: Payer: Self-pay | Admitting: Cardiology

## 2021-10-09 VITALS — BP 128/84 | HR 85 | Ht 70.0 in | Wt 184.8 lb

## 2021-10-09 DIAGNOSIS — I10 Essential (primary) hypertension: Secondary | ICD-10-CM

## 2021-10-09 DIAGNOSIS — I4819 Other persistent atrial fibrillation: Secondary | ICD-10-CM

## 2021-10-09 DIAGNOSIS — Z79899 Other long term (current) drug therapy: Secondary | ICD-10-CM

## 2021-10-09 MED ORDER — APIXABAN 5 MG PO TABS: 5.0000 mg | ORAL_TABLET | Freq: Two times a day (BID) | ORAL | 6 refills | Status: DC

## 2021-10-09 NOTE — Patient Instructions (Addendum)
Medication Instructions:  ?Your physician has recommended you make the following change in your medication:  ?Take eliquis 5 mg twice daily ?Stop aspirin ?Continue other medications the same ? ?Labwork: ?BMET & CBC today at [facility] ?Non-fasting ? ?Testing/Procedures: ?Your physician has requested that you have an echocardiogram. Echocardiography is a painless test that uses sound waves to create images of your heart. It provides your doctor with information about the size and shape of your heart and how well your heart?s chambers and valves are working. This procedure takes approximately one hour. There are no restrictions for this procedure. ? ?Follow-Up: ?Your physician recommends that you schedule a follow-up appointment in: 6 months ? ?Any Other Special Instructions Will Be Listed Below (If Applicable). ? ?If you need a refill on your cardiac medications before your next appointment, please call your pharmacy. ?

## 2021-10-15 DIAGNOSIS — Z20822 Contact with and (suspected) exposure to covid-19: Secondary | ICD-10-CM | POA: Diagnosis not present

## 2021-10-15 DIAGNOSIS — J441 Chronic obstructive pulmonary disease with (acute) exacerbation: Secondary | ICD-10-CM | POA: Diagnosis not present

## 2021-10-22 ENCOUNTER — Ambulatory Visit: Payer: Medicare HMO | Admitting: Nurse Practitioner

## 2021-10-22 ENCOUNTER — Ambulatory Visit (INDEPENDENT_AMBULATORY_CARE_PROVIDER_SITE_OTHER): Payer: Medicare HMO

## 2021-10-22 ENCOUNTER — Encounter: Payer: Self-pay | Admitting: Nurse Practitioner

## 2021-10-22 VITALS — BP 132/74 | HR 94 | Temp 98.0°F | Ht 70.0 in | Wt 187.2 lb

## 2021-10-22 DIAGNOSIS — R059 Cough, unspecified: Secondary | ICD-10-CM | POA: Diagnosis not present

## 2021-10-22 DIAGNOSIS — J471 Bronchiectasis with (acute) exacerbation: Secondary | ICD-10-CM | POA: Diagnosis not present

## 2021-10-22 DIAGNOSIS — J310 Chronic rhinitis: Secondary | ICD-10-CM

## 2021-10-22 DIAGNOSIS — J441 Chronic obstructive pulmonary disease with (acute) exacerbation: Secondary | ICD-10-CM | POA: Diagnosis not present

## 2021-10-22 DIAGNOSIS — I480 Paroxysmal atrial fibrillation: Secondary | ICD-10-CM | POA: Diagnosis not present

## 2021-10-22 DIAGNOSIS — R0602 Shortness of breath: Secondary | ICD-10-CM | POA: Diagnosis not present

## 2021-10-22 MED ORDER — IPRATROPIUM-ALBUTEROL 0.5-2.5 (3) MG/3ML IN SOLN
3.0000 mL | Freq: Once | RESPIRATORY_TRACT | Status: AC
  Administered 2021-10-22: 3 mL via RESPIRATORY_TRACT

## 2021-10-22 MED ORDER — PREDNISONE 10 MG PO TABS: ORAL_TABLET | ORAL | 0 refills | Status: DC

## 2021-10-22 MED ORDER — METHYLPREDNISOLONE ACETATE 80 MG/ML IJ SUSP
80.0000 mg | Freq: Once | INTRAMUSCULAR | Status: AC
  Administered 2021-10-22: 80 mg via INTRAMUSCULAR

## 2021-10-22 MED ORDER — DOXYCYCLINE HYCLATE 100 MG PO TABS: 100.0000 mg | ORAL_TABLET | Freq: Two times a day (BID) | ORAL | 0 refills | Status: DC

## 2021-10-22 MED ORDER — SPIRIVA RESPIMAT 1.25 MCG/ACT IN AERS: 2.0000 | INHALATION_SPRAY | Freq: Every day | RESPIRATORY_TRACT | 0 refills | Status: DC

## 2021-10-22 NOTE — Progress Notes (Signed)
Please notify patient that his CXR showed some small densities in the LLL, possibly atelectasis or scarring but could be early pneumonia. He is only on amoxicillin right now so I would recommend a doxycycline course to ensure coverage for typical respiratory pathogens. Please send doxy 1 tab Twice daily for 7 days. Take with food and wear sunscreen while taking. Thanks

## 2021-10-22 NOTE — Patient Instructions (Addendum)
Continue Symbicort 2 puffs Twice daily. Brush tongue and rinse mouth afterwards.  ?Continue Albuterol inhaler 2 puffs or 3 mL neb every 6 hours as needed for shortness of breath or wheezing. Notify if symptoms persist despite rescue inhaler/neb use. ?Continue omeprazole 20 mg daily  ? ?Trial adding Spiriva 2 puffs once daily. Use after you use your Symbicort in either the morning OR night ?Prednisone taper. 4 tabs for 3 days, then 3 tabs for 3 days, 2 tabs for 3 days, then 1 tab for 3 days, then stop. Take in AM with food. Start tomorrow  ?Mucinex 600 mg Twice daily for chest congestion/chest  ?Flutter valve 2-3 times a day  ?Start daily over the counter allergy medicine, either Zyrtec (cetrizine) or Claritin (loratadine)  ? ?Chest x ray today. We will notify you of any abnormal results  ? ?Follow up in 2 weeks with Dr. Annamaria Boots or Alanson Aly. If symptoms do not improve or worsen, please contact office for sooner follow up or seek emergency care. ?

## 2021-10-22 NOTE — Assessment & Plan Note (Signed)
Nonproductive cough.  Mucociliary therapy is advised.  CXR today. ?

## 2021-10-22 NOTE — Assessment & Plan Note (Signed)
AECOPD with increased DOE, cough and wheezing.  Depo 80 mg inj x1 and DuoNeb today.  Recently completed prednisone course and is currently on amoxicillin.  Will treat with extended prednisone taper.  CXR today to rule out superimposed infection as there was not one done at urgent care.  Advised mucociliary clearance therapies.  Add on Spiriva to obtain triple therapy regimen.  Samples provided today.  He did have problems in the past with being able to afford this so we will see if this impacts his breathing.  Close follow-up ? ?Patient Instructions  ?Continue Symbicort 2 puffs Twice daily. Brush tongue and rinse mouth afterwards.  ?Continue Albuterol inhaler 2 puffs or 3 mL neb every 6 hours as needed for shortness of breath or wheezing. Notify if symptoms persist despite rescue inhaler/neb use. ?Continue omeprazole 20 mg daily  ? ?Trial adding Spiriva 2 puffs once daily. Use after you use your Symbicort in either the morning OR night ?Prednisone taper. 4 tabs for 3 days, then 3 tabs for 3 days, 2 tabs for 3 days, then 1 tab for 3 days, then stop. Take in AM with food. Start tomorrow  ?Mucinex 600 mg Twice daily for chest congestion/chest  ?Flutter valve 2-3 times a day  ?Start daily over the counter allergy medicine, either Zyrtec (cetrizine) or Claritin (loratadine)  ? ?Chest x ray today. We will notify you of any abnormal results  ? ?Follow up in 2 weeks with Dr. Annamaria Boots or Alanson Aly. If symptoms do not improve or worsen, please contact office for sooner follow up or seek emergency care. ? ?

## 2021-10-22 NOTE — Assessment & Plan Note (Signed)
Given symptoms and timing, appears to have some allergic component.  Advised that he start over-the-counter antihistamine such as Claritin or Zyrtec.  May consider addition of intranasal steroid if symptoms persist. ?

## 2021-10-22 NOTE — Assessment & Plan Note (Addendum)
Irregular rhythm, rate controlled at visit.  Anticoagulated on Eliquis.  New diagnosis as of April.  Suspect that this has contributed some to his progressive DOE.  Follow-up with cardiology as scheduled.  Awaiting echocardiogram. ?

## 2021-10-22 NOTE — Progress Notes (Signed)
? ?'@Patient'$  ID: [name], male    DOB: 09-10-1935, 86 y.o.   MRN: [id number] ? ?Chief Complaint  ?Patient presents with  ? Follow-up  ?  1 year follow up. Pt states he is having some issues noted with his breathing. Pt states her went to urgent care and was placed on prednisone and antibiotic. Pt is still on the antibiotic. Pt is on Symbicort daily and Albuterol as needed.   ? ? ?Referring provider: ?Lavella Lemons, PA ? ?HPI: ?86 year old male veteran, former smoker followed for COPD with chronic bronchitis, bronchiectasis.  He is a patient of Dr. Janee Morn and last seen in office 06/27/2020.  Past medical history significant for insomnia, hypertension, gastritis, GERD, atherosclerosis, prostate cancer, OA, BPH, HLD. ? ?TEST/EVENTS:  ?04/14/2018 spirometry: FVC 58, FEV1 53, ratio 66 ?06/27/2020 CXR 2 view: There are chronic large lung volumes with streaky scarlike densities at each base.  There is no acute process noted.  Overall stable from 2020. ? ?06/27/2020: OV with Dr. Annamaria Boots.  Seen in late December at urgent care for clinical diagnosis of pneumonia; no CXR.  Treated with amoxicillin and Pred taper.  Reported feeling some better at visit but still having more DOE than usual.  Was switched by the [place] from Symbicort to Advair.  Feels like he did better on Symbicort.  Advised that he may be able to get [facility] covered.  Rx sent and instructed to notify if he felt like this helped his breathing.  CXR showed chronic large lung volumes with streaky scarlike densities at the bases.  There is no evidence of acute process. ? ?10/22/2021: Today-follow-up ?Patient presents today for overdue follow-up.  He does report that he is having some issues with his breathing as well.  Reports that he went to urgent care 10/15/2021 with cough, congestion and URI symptoms.  COVID was negative.  Was treated with prednisone and antibiotics for AECOPD and possible URI.  He has completed the prednisone and is currently still on amoxicillin therapy.   He is still having persistent shortness of breath with minimal exertion.  He feels like he cannot lie flat at night due to his breathing and has significant wheezing.  He has a chronic cough at baseline, feels like it is relatively unchanged and mostly dry. He does have chest congestion and feels like he has mucus he should cough up, but never comes.  He does continue to have some nasal congestion and drainage; reports that he pretty much always has clear nasal drainage that is worse this time a year.  Denies lower extremity swelling, fevers, interim sick exposures, hemoptysis, recent weight loss, night sweats.  He was able to get Symbicort covered through the [place] and is still on this.  He was also recently diagnosed with A-fib and started on Eliquis and metoprolol.  Awaiting echocardiogram.  Using his albuterol a few times a day. ? ?Allergies  ?Allergen Reactions  ? Diclofenac Shortness Of Breath and Swelling  ? Guaifenesin Er Other (See Comments)  ?  dizzy  ? Roflumilast Palpitations  ?  Heart race fast, depressed  ? ? ?Immunization History  ?Administered Date(s) Administered  ? Fluad Quad(high Dose 65+) 03/22/2016, 02/24/2019, 03/11/2020  ? Influenza Inj Mdck Quad Pf 02/24/2019  ? Influenza Nasal 02/21/2011  ? Influenza Split 04/08/2004, 03/21/2012, 03/21/2013, 04/20/2014, 02/21/2015, 03/22/2016  ? Influenza Whole 04/12/2009, 04/11/2010  ? Influenza, High Dose Seasonal PF 03/22/2016, 03/20/2017, 03/11/2018, 03/11/2020  ? Influenza, Seasonal, Injecte, Preservative Fre 02/21/2015, 03/22/2016  ?  Influenza,Quad,Nasal, Live 02/21/2011  ? Influenza,inj,Quad PF,6+ Mos 02/21/2015  ? Influenza-Unspecified 03/30/2003, 02/21/2015, 03/22/2016, 03/21/2018, 03/25/2018  ? Moderna Sars-Covid-2 Vaccination 07/12/2019, 08/09/2019, 05/13/2020  ? Pneumococcal Conjugate-13 09/30/2013  ? Pneumococcal Polysaccharide-23 05/18/2011  ? Pneumococcal-Unspecified 05/18/2011  ? Tdap 11/20/2005  ? Tetanus 09/21/2015  ? ? ?Past Medical History:   ?Diagnosis Date  ? Arthritis   ? neck, back, feet and ankles, feet and ankles swell due to arthritis, prop feet up swelling goes down  ? Bronchiectasis without complication ([place])   ? bronchitis oct 2019 got clearance for surgery  ? COPD (chronic obstructive pulmonary disease) with chronic bronchitis ([place])   ? pulmologist-  dr Tarri Fuller young  ? GERD (gastroesophageal reflux disease)   ? History of colon polyps   ? 2007--  simple adenoma  ? History of gastritis   ? SECONDARY TO ADVIL  ? History of skin cancer   ? EXCISION LEFT SIDE OF FACE and neck  ? Hypertension   ? Insomnia   ? Nocturia   ? Osteoporosis   ? Prostate cancer ([place])   ? dx  2009--  XRT takine lupron shots for last shot oct 2019  ? ? ?Tobacco History: ?Social History  ? ?Tobacco Use  ?Smoking Status Former  ? Packs/day: 1.50  ? Years: 30.00  ? Pack years: 45.00  ? Types: Cigarettes  ? Quit date: 06/23/1979  ? Years since quitting: 42.3  ?Smokeless Tobacco Never  ? ?Counseling given: Not Answered ? ? ?Outpatient Medications Prior to Visit  ?Medication Sig Dispense Refill  ? albuterol (PROVENTIL HFA;VENTOLIN HFA) 108 (90 BASE) MCG/ACT inhaler Inhale 2 puffs into the lungs every 6 (six) hours as needed.    ? alendronate (FOSAMAX) 70 MG tablet Take 1 tablet (70 mg total) by mouth once a week. Take with a full glass of water on an empty stomach. 12 tablet 1  ? amoxicillin (AMOXIL) 875 MG tablet Take 875 mg by mouth 2 (two) times daily.    ? apixaban (ELIQUIS) 5 MG TABS tablet Take 1 tablet (5 mg total) by mouth 2 (two) times daily. 60 tablet 6  ? budesonide-formoterol (SYMBICORT) 160-4.5 MCG/ACT inhaler Inhale 2 puffs into the lungs 2 (two) times daily.    ? diphenhydramine-acetaminophen (TYLENOL PM) 25-500 MG TABS tablet Take 1 tablet by mouth at bedtime.    ? doxazosin (CARDURA) 4 MG tablet TAKE 1 TABLET AT BEDTIME 90 tablet 0  ? ergocalciferol (VITAMIN D2) 1.25 MG (50000 UT) capsule Take 1 capsule (50,000 Units total) by mouth once a week. Takes on monday 5  capsule 5  ? hydrochlorothiazide (HYDRODIURIL) 25 MG tablet Take 1 tablet (25 mg total) by mouth every morning. 90 tablet 1  ? leuprolide (LUPRON) 22.5 MG injection Inject 22.5 mg into the muscle every 3 (three) months.    ? losartan (COZAAR) 100 MG tablet TAKE 1 TABLET (100 MG TOTAL) BY MOUTH EVERY MORNING. 90 tablet 0  ? MAGNESIUM PO Take 500 mg by mouth daily.    ? Melatonin 12 MG TABS Take 1 tablet by mouth at bedtime. 90 tablet 3  ? metoprolol succinate (TOPROL-XL) 50 MG 24 hr tablet Take 50 mg by mouth daily. Take with or immediately following a meal.    ? Multiple Vitamin (MULTIVITAMIN) capsule Take 1 capsule by mouth daily.    ? omeprazole (PRILOSEC) 20 MG capsule Take 1 capsule (20 mg total) by mouth every morning. 90 capsule 1  ? Red Yeast Rice 600 MG CAPS Take 25 capsules  by mouth every morning.    ? ?No facility-administered medications prior to visit.  ? ? ? ?Review of Systems:  ? ?Constitutional: No weight loss or gain, night sweats, fevers, chills, fatigue, or lassitude. ?HEENT: No headaches, difficulty swallowing, tooth/dental problems, or sore throat. No sneezing, itching, ear ache +nasal congestion/drainage ?CV:  + Orthopnea.  No chest pain, PND, swelling in lower extremities, anasarca, dizziness, palpitations, syncope ?Resp: +shortness of breath with exertion; nonproductive, congested cough; wheezing. No excess mucus or change in color of mucus. No hemoptysis. No chest wall deformity ?GI:  No heartburn, indigestion, abdominal pain, nausea, vomiting, diarrhea, change in bowel habits, loss of appetite, bloody stools.  ?GU: No dysuria, change in color of urine, urgency or frequency.  No flank pain, no hematuria  ?Skin: No rash, lesions, ulcerations ?MSK:  No joint pain or swelling.  No decreased range of motion.  No back pain. ?Neuro: No dizziness or lightheadedness.  ?Psych: No depression or anxiety. Mood stable.  ? ? ? ?Physical Exam: ? ?BP 132/74 (BP Location: Right Arm, Patient Position: Sitting,  Cuff Size: Normal)   Pulse 94   Temp 98 ?F (36.7 ?C) (Oral)   Ht '5\' 10"'$  (1.778 m)   Wt 187 lb 3.2 oz (84.9 kg)   SpO2 96%   BMI 26.86 kg/m?  ? ?GEN: Pleasant, interactive, well-appearing; elderly;

## 2021-11-04 ENCOUNTER — Other Ambulatory Visit: Payer: Medicare HMO

## 2021-11-05 ENCOUNTER — Telehealth: Payer: Self-pay | Admitting: Primary Care

## 2021-11-05 ENCOUNTER — Ambulatory Visit: Payer: Medicare HMO | Admitting: Nurse Practitioner

## 2021-11-05 ENCOUNTER — Encounter: Payer: Self-pay | Admitting: Nurse Practitioner

## 2021-11-05 VITALS — BP 120/80 | HR 83 | Temp 97.9°F | Ht 70.0 in | Wt 182.4 lb

## 2021-11-05 DIAGNOSIS — J189 Pneumonia, unspecified organism: Secondary | ICD-10-CM | POA: Diagnosis not present

## 2021-11-05 DIAGNOSIS — J479 Bronchiectasis, uncomplicated: Secondary | ICD-10-CM | POA: Diagnosis not present

## 2021-11-05 DIAGNOSIS — R600 Localized edema: Secondary | ICD-10-CM | POA: Diagnosis not present

## 2021-11-05 DIAGNOSIS — J449 Chronic obstructive pulmonary disease, unspecified: Secondary | ICD-10-CM

## 2021-11-05 LAB — BASIC METABOLIC PANEL
BUN: 27 mg/dL — ABNORMAL HIGH (ref 6–23)
CO2: 34 mEq/L — ABNORMAL HIGH (ref 19–32)
Calcium: 9.4 mg/dL (ref 8.4–10.5)
Chloride: 102 mEq/L (ref 96–112)
Creatinine, Ser: 1.09 mg/dL (ref 0.40–1.50)
GFR: 62.42 mL/min (ref >60.00)
Glucose, Bld: 93 mg/dL (ref 70–99)
Potassium: 4.4 mEq/L (ref 3.5–5.1)
Sodium: 140 mEq/L (ref 135–145)

## 2021-11-05 LAB — BRAIN NATRIURETIC PEPTIDE: Pro B Natriuretic peptide (BNP): 387 pg/mL — ABNORMAL HIGH (ref 0.0–100.0)

## 2021-11-05 MED ORDER — SPIRIVA RESPIMAT 2.5 MCG/ACT IN AERS: 2.0000 | INHALATION_SPRAY | Freq: Every day | RESPIRATORY_TRACT | 0 refills | Status: DC

## 2021-11-05 MED ORDER — FUROSEMIDE 40 MG PO TABS: 40.0000 mg | ORAL_TABLET | Freq: Every day | ORAL | 0 refills | Status: DC

## 2021-11-05 NOTE — Telephone Encounter (Signed)
Please let patient know he can go ahead and take lasix as direct and needs to follow-up with cardiology. Creatinine was normal and bnp was elevated.  ?

## 2021-11-05 NOTE — Progress Notes (Signed)
? ?'@Patient'$  ID: [name], male    DOB: Nov 10, 1935, 86 y.o.   MRN: [id number] ? ?Chief Complaint  ?Patient presents with  ? Follow-up  ?  Patient feels like he has not noticed much of a difference with feeling better since last visit. Productive cough with yellow sputum. States he has been using both inhalers but has not noticed a difference with the Spiriva. Also using flutter valve does not feel like it helps.  ? ? ?Referring provider: ?Lavella Lemons, PA ? ?HPI: ?86 year old male veteran, former smoker followed for COPD with chronic bronchitis and bronchiectasis.  He is a patient Dr. Janee Morn and last seen in office on 10/22/2021 by [facility] NP.  Past medical history significant for insomnia, hypertension, gastritis, GERD, atherosclerosis, prostate cancer, OA, BPH, HLD. ? ?TEST/EVENTS:  ?04/14/2018 spirometry: FVC 58, FEV1 53, ratio 66.  Moderately severe obstructive airway disease ?10/22/2021 CXR 2 view: There are small linear densities in the left lower lung field, possibly atelectasis or scarring but could also be early pneumonia. ? ?10/22/2021: OV with Valton Schwartz NP for overdue follow-up but also having some acute symptoms.  Went to urgent care on 09/25/2021 with cough, congestion and URI symptoms.  COVID was negative.  He was treated with prednisone and antibiotics for AECOPD and possible URI.  Has completed the prednisone and is still on amoxicillin therapy.  Felt like he was still having shortness of breath with minimal exertion.  Unable to lie flat at night due to his breathing and has significant wheezing.  Chronic cough was at baseline and relatively unchanged but did feel some worsening chest congestion.  Recently able to get Symbicort covered through the [place].  Also he was recently diagnosed with A-fib and started on Eliquis and metoprolol by cardiology.  Awaiting echocardiogram.  CXR showed some left lower lobe densities, concerning for possible pneumonia.  Started on doxycycline course.  Treated with Depo injection  and extended prednisone taper for AECOPD.  Advised mucociliary clearance therapies.  Add on Spiriva to obtain triple therapy regimen-provided with samples.  Advised him to start an over-the-counter allergy medicine for allergic rhinitis. ? ?11/05/2021: Today-follow-up ?Patient presents today for follow up after being treated for AECOPD and possible LLL pneumonia. He reports feeling much better when compared to his last visit. He says he feels about the same as he has for the last 25 years. Cough remains productive but is back to his baseline. DOE has also returned to his baseline. He does continue to have ongoing issues with laying flat and becoming short of breath. He has also had some increased swelling in his lower extremities, which he says has been ongoing for a while now but did not mention at our last visit. He only takes his hctz occasionally because of how often it makes him have to urinate. He denies palpitations, hemoptysis, unintentional weight loss, anorexia, or wheezing. Hasn't noticed a huge difference with addition of Spiriva. He continues on Symbicort daily. He did start claritin since our last visit which has helped some with his allergy symptoms.  ? ?Allergies  ?Allergen Reactions  ? Diclofenac Shortness Of Breath and Swelling  ? Guaifenesin Er Other (See Comments)  ?  dizzy  ? Roflumilast Palpitations  ?  Heart race fast, depressed  ? ? ?Immunization History  ?Administered Date(s) Administered  ? Fluad Quad(high Dose 65+) 03/22/2016, 02/24/2019, 03/11/2020, 03/31/2021  ? Influenza Inj Mdck Quad Pf 02/24/2019  ? Influenza Nasal 02/21/2011  ? Influenza Split 04/08/2004, 03/21/2012, 03/21/2013, 04/20/2014,  02/21/2015, 03/22/2016  ? Influenza Whole 04/12/2009, 04/11/2010  ? Influenza, High Dose Seasonal PF 03/22/2016, 03/20/2017, 03/11/2018, 03/11/2020, 06/14/2020  ? Influenza, Seasonal, Injecte, Preservative Fre 02/21/2015, 03/22/2016  ? Influenza,Quad,Nasal, Live 02/21/2011  ? Influenza,inj,Quad  PF,6+ Mos 02/21/2015  ? Influenza-Unspecified 03/30/2003, 02/21/2015, 03/22/2016, 03/21/2018, 03/25/2018  ? Moderna Sars-Covid-2 Vaccination 07/12/2019, 08/09/2019, 05/13/2020  ? Pneumococcal Conjugate-13 09/30/2013  ? Pneumococcal Polysaccharide-23 05/18/2011  ? Pneumococcal-Unspecified 05/18/2011  ? Tdap 11/20/2005  ? Tetanus 09/21/2015  ? ? ?Past Medical History:  ?Diagnosis Date  ? Arthritis   ? neck, back, feet and ankles, feet and ankles swell due to arthritis, prop feet up swelling goes down  ? Bronchiectasis without complication ([place])   ? bronchitis oct 2019 got clearance for surgery  ? COPD (chronic obstructive pulmonary disease) with chronic bronchitis ([place])   ? pulmologist-  dr Tarri Fuller young  ? GERD (gastroesophageal reflux disease)   ? History of colon polyps   ? 2007--  simple adenoma  ? History of gastritis   ? SECONDARY TO ADVIL  ? History of skin cancer   ? EXCISION LEFT SIDE OF FACE and neck  ? Hypertension   ? Insomnia   ? Nocturia   ? Osteoporosis   ? Prostate cancer ([place])   ? dx  2009--  XRT takine lupron shots for last shot oct 2019  ? ? ?Tobacco History: ?Social History  ? ?Tobacco Use  ?Smoking Status Former  ? Packs/day: 1.50  ? Years: 30.00  ? Pack years: 45.00  ? Types: Cigarettes  ? Quit date: 06/23/1979  ? Years since quitting: 42.4  ?Smokeless Tobacco Never  ? ?Counseling given: Not Answered ? ? ?Outpatient Medications Prior to Visit  ?Medication Sig Dispense Refill  ? albuterol (PROVENTIL HFA;VENTOLIN HFA) 108 (90 BASE) MCG/ACT inhaler Inhale 2 puffs into the lungs every 6 (six) hours as needed.    ? alendronate (FOSAMAX) 70 MG tablet Take 1 tablet (70 mg total) by mouth once a week. Take with a full glass of water on an empty stomach. 12 tablet 1  ? apixaban (ELIQUIS) 5 MG TABS tablet Take 1 tablet (5 mg total) by mouth 2 (two) times daily. 60 tablet 6  ? budesonide-formoterol (SYMBICORT) 160-4.5 MCG/ACT inhaler Inhale 2 puffs into the lungs 2 (two) times daily.    ?  diphenhydramine-acetaminophen (TYLENOL PM) 25-500 MG TABS tablet Take 1 tablet by mouth at bedtime.    ? doxazosin (CARDURA) 4 MG tablet TAKE 1 TABLET AT BEDTIME 90 tablet 0  ? ergocalciferol (VITAMIN D2) 1.25 MG (50000 UT) capsule Take 1 capsule (50,000 Units total) by mouth once a week. Takes on monday 5 capsule 5  ? hydrochlorothiazide (HYDRODIURIL) 25 MG tablet Take 1 tablet (25 mg total) by mouth every morning. 90 tablet 1  ? leuprolide (LUPRON) 22.5 MG injection Inject 22.5 mg into the muscle every 3 (three) months.    ? losartan (COZAAR) 100 MG tablet TAKE 1 TABLET (100 MG TOTAL) BY MOUTH EVERY MORNING. 90 tablet 0  ? MAGNESIUM PO Take 500 mg by mouth daily.    ? Melatonin 12 MG TABS Take 1 tablet by mouth at bedtime. 90 tablet 3  ? metoprolol succinate (TOPROL-XL) 50 MG 24 hr tablet Take 50 mg by mouth daily. Take with or immediately following a meal.    ? Multiple Vitamin (MULTIVITAMIN) capsule Take 1 capsule by mouth daily.    ? omeprazole (PRILOSEC) 20 MG capsule Take 1 capsule (20 mg total) by mouth every morning. [place]  capsule 1  ? Red Yeast Rice 600 MG CAPS Take 1 capsule by mouth every morning.    ? Tiotropium Bromide Monohydrate (SPIRIVA RESPIMAT) 1.25 MCG/ACT AERS Inhale 2 puffs into the lungs daily. 4 g 0  ? amoxicillin (AMOXIL) 875 MG tablet Take 875 mg by mouth 2 (two) times daily.    ? doxycycline (VIBRA-TABS) 100 MG tablet Take 1 tablet (100 mg total) by mouth 2 (two) times daily. 14 tablet 0  ? predniSONE (DELTASONE) 10 MG tablet 4 tabs for 3 days, then 3 tabs for 3 days, 2 tabs for 3 days, then 1 tab for 3 days, then stop 30 tablet 0  ? ?No facility-administered medications prior to visit.  ? ? ? ?Review of Systems:  ? ?Constitutional: No weight loss or gain, night sweats, fevers, chills, fatigue, or lassitude. ?HEENT: No headaches, difficulty swallowing, tooth/dental problems, or sore throat. No sneezing, itching, ear ache, nasal congestion, or post nasal drip ?CV:  +orthopnea, swelling in  lower extremities. No chest pain,  PND, anasarca, dizziness, palpitations, syncope ?Resp: +shortness of breath with exertion  (baseline); productive cough (improved; at baseline). No excess mucus or change in color of mucus.  No

## 2021-11-05 NOTE — Telephone Encounter (Signed)
Called and spoke with pt letting him know results of bloodwork and recs and he verbalized understanding. Nothing further needed. ?

## 2021-11-05 NOTE — Assessment & Plan Note (Signed)
AECOPD resolving. Cough is at baseline. Advised he continue Spiriva for a total of a month to see if he receives any benefit - provided with additional samples. Recommended he continue mucinex and flutter valve for mucociliary clearance.  ? ?Patient Instructions  ?Continue Symbicort 2 puffs Twice daily. Brush tongue and rinse mouth afterwards.  ?Continue Spiriva 2 puffs once daily.  ?Continue Albuterol inhaler 2 puffs or 3 mL neb every 6 hours as needed for shortness of breath or wheezing. Notify if symptoms persist despite rescue inhaler/neb use. ?Continue omeprazole 20 mg daily  ?Mucinex 600 mg Twice daily for chest congestion/chest  ?Flutter valve 2-3 times a day  ?Continue Claritin (loratadine) 10 mg daily for allergies  ? ?Lasix 40 mg for 3 days. Take in AM.  ? ?Labs today - BMET and BNP ? ?Follow up with cardiology as scheduled to discuss the swelling in your legs and difficulties breathing when laying flat.  ?  ?Follow up in 4 weeks with Dr. Annamaria Boots or Alanson Aly with repeat chest x ray. If symptoms do not improve or worsen, please contact office for sooner follow up or seek emergency care. ? ? ?

## 2021-11-05 NOTE — Assessment & Plan Note (Signed)
Clinically improved. Will obtain repeat CXR in 4 weeks to ensure resolution. Advised to notify if worsening symptoms develop.  ?

## 2021-11-05 NOTE — Patient Instructions (Addendum)
Continue Symbicort 2 puffs Twice daily. Brush tongue and rinse mouth afterwards.  ?Continue Spiriva 2 puffs once daily.  ?Continue Albuterol inhaler 2 puffs or 3 mL neb every 6 hours as needed for shortness of breath or wheezing. Notify if symptoms persist despite rescue inhaler/neb use. ?Continue omeprazole 20 mg daily  ?Mucinex 600 mg Twice daily for chest congestion/chest  ?Flutter valve 2-3 times a day  ?Continue Claritin (loratadine) 10 mg daily for allergies  ? ?Lasix 40 mg for 3 days. Take in AM.  ? ?Labs today - BMET and BNP ? ?Follow up with cardiology as scheduled to discuss the swelling in your legs and difficulties breathing when laying flat.  ?  ?Follow up in 4 weeks with Dr. Annamaria Boots or Alanson Aly with repeat chest x ray. If symptoms do not improve or worsen, please contact office for sooner follow up or seek emergency care. ?

## 2021-11-05 NOTE — Addendum Note (Signed)
Addended by: Elby Beck R on: 11/05/2021 11:25 AM ? ? Modules accepted: Orders ? ?

## 2021-11-05 NOTE — Assessment & Plan Note (Signed)
Advised continuation of mucociliary clearance therapies.  ?

## 2021-11-05 NOTE — Assessment & Plan Note (Signed)
Evidence of fluid overload and symptomatic with orthopnea. He does not take his hctz as prescribed. Check BMET and BNP today. Will have him complete lasix 40 mg for 3 days if kidney function stable. Advised him to hold his hctz these days and then resume taking daily, as directed after he completes the short course of lasix. Recommended he follow up with cardiology. Monitor BP and notify if <100/60 and monitor for swelling of the face/tongue.  ?

## 2021-11-06 ENCOUNTER — Telehealth: Payer: Self-pay | Admitting: Cardiology

## 2021-11-06 NOTE — Telephone Encounter (Signed)
-----   Message from Satira Sark, MD sent at 10/27/2021 10:40 AM EDT ----- Results reviewed.  Hemoglobin and creatinine are normal.  Continue with current plan and follow-up.

## 2021-11-06 NOTE — Telephone Encounter (Signed)
Pt called stating they received a call today at 9 AM and was requested to call back. He is unsure as to what this is in regards to, but would like a call back.

## 2021-11-06 NOTE — Telephone Encounter (Signed)
Patient informed. Copy sent to PCP °

## 2021-11-13 ENCOUNTER — Other Ambulatory Visit: Payer: Medicare HMO

## 2021-11-13 DIAGNOSIS — X32XXXD Exposure to sunlight, subsequent encounter: Secondary | ICD-10-CM | POA: Diagnosis not present

## 2021-11-13 DIAGNOSIS — Z85828 Personal history of other malignant neoplasm of skin: Secondary | ICD-10-CM | POA: Diagnosis not present

## 2021-11-13 DIAGNOSIS — L570 Actinic keratosis: Secondary | ICD-10-CM | POA: Diagnosis not present

## 2021-11-13 DIAGNOSIS — Z08 Encounter for follow-up examination after completed treatment for malignant neoplasm: Secondary | ICD-10-CM | POA: Diagnosis not present

## 2021-11-18 ENCOUNTER — Ambulatory Visit (INDEPENDENT_AMBULATORY_CARE_PROVIDER_SITE_OTHER): Payer: Medicare HMO

## 2021-11-18 DIAGNOSIS — I4819 Other persistent atrial fibrillation: Secondary | ICD-10-CM

## 2021-11-18 LAB — ECHOCARDIOGRAM COMPLETE
AV Vena cont: 0.26 cm
Area-P 1/2: 5.16 cm2
Calc EF: 45.7 %
MV M vel: 5.12 m/s
MV Peak grad: 104.8 mmHg
P 1/2 time: 615 msec
S' Lateral: 3.66 cm
Single Plane A2C EF: 51.5 %
Single Plane A4C EF: 42.8 %

## 2021-11-20 ENCOUNTER — Telehealth: Payer: Self-pay | Admitting: *Deleted

## 2021-11-20 MED ORDER — METOPROLOL SUCCINATE ER 50 MG PO TB24: 75.0000 mg | ORAL_TABLET | Freq: Every day | ORAL | 2 refills | Status: DC

## 2021-11-20 NOTE — Telephone Encounter (Signed)
Patient informed and verbalized understanding of plan. Copy sent to PCP 

## 2021-11-20 NOTE — Telephone Encounter (Signed)
-----   Message from Satira Sark, MD sent at 11/18/2021  1:02 PM EDT ----- Results reviewed.  LVEF is mildly reduced at 40 to 45%.  With his atrial fibrillation there may be some component of tachycardia induced cardiomyopathy.  Suggest increasing Toprol-XL to 75 mg daily for now.  Keep follow-up as scheduled unless he develops any symptoms in the interim.

## 2021-12-18 ENCOUNTER — Other Ambulatory Visit: Payer: Self-pay | Admitting: *Deleted

## 2021-12-18 ENCOUNTER — Ambulatory Visit: Payer: Medicare HMO

## 2021-12-18 DIAGNOSIS — J189 Pneumonia, unspecified organism: Secondary | ICD-10-CM

## 2021-12-18 NOTE — Progress Notes (Addendum)
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  Walked 3 laps = 555 feet. Office Spirometry 04/14/2018-moderately severe obstructive airways disease, FVC 2.3/58%, FEV1 1.5/53%, ratio 0.66, FEF 25-75% 0.9/45% -------------------------------------------------------------------------------------------------------------   06/27/20- 86 year old male [place] former smoker followed for chronic bronchitis/bronchiectasis COPD, insomnia, complicated by HBP, chronic gastritis, GERD, atherosclerosis, prostate cancer Spiriva HandiHaler, Symbicort 160, albuterol HFA, Seen UC 12/24- RLL pneumonia   Rxd amoxil 875 bid, pred taper Covid vax-3 Moderna Flu vax- had -----Patient was on Symbicort and was [facility] by [facility] but it is not helping him. Patient has shortness of breath since switching. Dry/productive cough with some clear/yellow sputum.  He prefers Symbicort. Had clinical dx pneumonia- no CXR. Treated with prednisone and amoxacillin. Still feels more DOE than usual with increased need for rescue hfa 2-3x/day. No fever or purulent now.   12/19/21- 86 year old male [place] former smoker followed for chronic bronchitis/bronchiectasis COPD, insomnia, complicated by HBP, PAFib/ Eliquis, chronic gastritis, GERD, atherosclerosis, prostate cancer O2 2L prn (from a friend who died) -Spiriva HandiHaler, Symbicort 160, albuterol HFA, Covid vax-3 Moderna OV 5/3, 5/17 NP [place]- f/u AECOPD/ LLL pneumonia, baseline productive cough. Ankle edema. Non-compliant HCTZ. He says his breathing and overall health  are "about like always".  I discussed his recent office visits.  He blames his bladder control/frequent bathroom trip issues on therapy for prostate cancer.  Says his ankles have been swelling for 8 years, but then turned around and blamed it on COVID vaccination. Wears cut-off elastic hose. Says cough is worse if he leans back in a recliner or lies flat.  Usually dry cough but sometimes productive white or slightly discolored-no blood. Exertion is limited by dyspnea, back pain and his feet. A friend died and left him an oxygen concentrator he uses at 2 L as needed. CXR 10/22/21- IMPRESSION: Small linear densities in the left lower lung fields suggest minimal subsegmental atelectasis or scarring. There is no focal pulmonary consolidation. There is no pleural effusion.  Addendum 01/01/22- Overnight Oximetry on room air 12/24/21 qualifies for sleep O2. He was given an O2 concentrator by a friend. He can continue to use this for sleep at 2L. Does not have a D?ME order for O2.  ROS-See HPI  + = positive Constitutional:   No-   weight loss, night sweats, fevers, chills, fatigue, +lassitude. HEENT:   No-  headaches, difficulty swallowing, tooth/dental problems, sore throat,       No-  sneezing, itching, ear ache,     no current- nasal congestion, post nasal drip,  CV:  No-chest pain, orthopnea, PND, +swelling in lower extremities, anasarca, dizziness, palpitations Resp: + shortness of breath with exertion or at rest.                +productive cough,  + non-productive cough,  No- coughing up of blood.              -change in color of mucus.  No- wheezing.   Skin: No-   rash or lesions. GI: + heartburn, indigestion,  No-abdominal pain, nausea, vomiting,  GU:  MS:  No-   joint pain or swelling.    + back pain. Neuro-  nothing unusual Psych:  No- change in mood or affect. No depression or anxiety.  No memory loss.  OBJ General- Alert, Oriented, Affect-appropriate, Distress- none acute;  +overweight Skin- rash-none, lesions- none, excoriation- none Lymphadenopathy- none Head- atraumatic            Eyes- Gross vision intact, PERRLA, conjunctivae clear secretions            Ears- Hearing, canals-normal            Nose- Clear, no-Septal dev, mucus, polyps, erosion, perforation             Throat- Mallampati III , mucosa cobblestoned , drainage- none, tonsils- atrophic Neck- flexible , trachea midline, no stridor , thyroid nl, carotid no bruit Chest - symmetrical excursion , unlabored           Heart/CV- RRR/ frequent skips , no murmur , no gallop  , no rub, nl s1 s2                           - JVD- none , edema+1-2/elastic socks, stasis changes- none, varices- none           Lung-+crackles ,+ slow exhalation, Unlabored , dullness-none, rub- none,            Chest wall-  Abd-  Br/ Gen/ Rectal- Not done, not indicated Extrem- cyanosis- none, clubbing, none, atrophy- none, strength- nl Neuro- grossly intact to observation

## 2021-12-19 ENCOUNTER — Ambulatory Visit: Payer: Medicare HMO | Admitting: Internal Medicine

## 2021-12-19 ENCOUNTER — Encounter: Payer: Self-pay | Admitting: Internal Medicine

## 2021-12-19 VITALS — BP 130/78 | HR 58 | Temp 97.5°F | Ht 70.0 in | Wt 187.2 lb

## 2021-12-19 DIAGNOSIS — I480 Paroxysmal atrial fibrillation: Secondary | ICD-10-CM | POA: Diagnosis not present

## 2021-12-19 DIAGNOSIS — J449 Chronic obstructive pulmonary disease, unspecified: Secondary | ICD-10-CM | POA: Diagnosis not present

## 2021-12-19 DIAGNOSIS — J479 Bronchiectasis, uncomplicated: Secondary | ICD-10-CM

## 2021-12-19 NOTE — Assessment & Plan Note (Signed)
Rhythm feels like regular sinus with frequent skips and early beats.  He is followed by cardiology.

## 2021-12-19 NOTE — Patient Instructions (Addendum)
We can continue present meds  If you can raise your feet when you sit, that will help your swelling.  Order- overnight oximetry on room air    dx Chronic bronchiectasis

## 2021-12-19 NOTE — Assessment & Plan Note (Signed)
He considers himself at his baseline now, somewhat improved from earlier in the spring.  Chest is rarely completely clear and he coughs every day.  He has a home concentrator that he inherited from a friend. Plan-continue current meds.  Order overnight oximetry

## 2021-12-24 DIAGNOSIS — R0683 Snoring: Secondary | ICD-10-CM | POA: Diagnosis not present

## 2021-12-24 DIAGNOSIS — G4730 Sleep apnea, unspecified: Secondary | ICD-10-CM | POA: Diagnosis not present

## 2022-01-15 DIAGNOSIS — Z85828 Personal history of other malignant neoplasm of skin: Secondary | ICD-10-CM | POA: Diagnosis not present

## 2022-01-15 DIAGNOSIS — X32XXXD Exposure to sunlight, subsequent encounter: Secondary | ICD-10-CM | POA: Diagnosis not present

## 2022-01-15 DIAGNOSIS — Z08 Encounter for follow-up examination after completed treatment for malignant neoplasm: Secondary | ICD-10-CM | POA: Diagnosis not present

## 2022-01-15 DIAGNOSIS — L570 Actinic keratosis: Secondary | ICD-10-CM | POA: Diagnosis not present

## 2022-03-02 DIAGNOSIS — Z0000 Encounter for general adult medical examination without abnormal findings: Secondary | ICD-10-CM | POA: Diagnosis not present

## 2022-03-02 DIAGNOSIS — J449 Chronic obstructive pulmonary disease, unspecified: Secondary | ICD-10-CM | POA: Diagnosis not present

## 2022-03-02 DIAGNOSIS — C61 Malignant neoplasm of prostate: Secondary | ICD-10-CM | POA: Diagnosis not present

## 2022-03-02 DIAGNOSIS — E119 Type 2 diabetes mellitus without complications: Secondary | ICD-10-CM | POA: Diagnosis not present

## 2022-03-02 DIAGNOSIS — M47816 Spondylosis without myelopathy or radiculopathy, lumbar region: Secondary | ICD-10-CM | POA: Diagnosis not present

## 2022-03-02 DIAGNOSIS — M47812 Spondylosis without myelopathy or radiculopathy, cervical region: Secondary | ICD-10-CM | POA: Diagnosis not present

## 2022-03-02 DIAGNOSIS — M810 Age-related osteoporosis without current pathological fracture: Secondary | ICD-10-CM | POA: Diagnosis not present

## 2022-03-02 DIAGNOSIS — I10 Essential (primary) hypertension: Secondary | ICD-10-CM | POA: Diagnosis not present

## 2022-03-02 DIAGNOSIS — I480 Paroxysmal atrial fibrillation: Secondary | ICD-10-CM | POA: Diagnosis not present

## 2022-03-11 DIAGNOSIS — R972 Elevated prostate specific antigen [PSA]: Secondary | ICD-10-CM | POA: Diagnosis not present

## 2022-03-11 DIAGNOSIS — R9721 Rising PSA following treatment for malignant neoplasm of prostate: Secondary | ICD-10-CM | POA: Diagnosis not present

## 2022-03-11 DIAGNOSIS — R350 Frequency of micturition: Secondary | ICD-10-CM | POA: Diagnosis not present

## 2022-03-31 DIAGNOSIS — C61 Malignant neoplasm of prostate: Secondary | ICD-10-CM | POA: Diagnosis not present

## 2022-03-31 DIAGNOSIS — Z5111 Encounter for antineoplastic chemotherapy: Secondary | ICD-10-CM | POA: Diagnosis not present

## 2022-04-06 ENCOUNTER — Other Ambulatory Visit: Payer: Self-pay | Admitting: Cardiology

## 2022-04-06 NOTE — Telephone Encounter (Signed)
Prescription refill request for Eliquis received. Indication: AF Last office visit: 10/09/21  Myles Gip MD Scr: 1.09 on 11/05/21 Age: 86 Weight: 83.8kg  Based on above findings Eliquis '5mg'$  twice daily is the appropriate dose.  Refill approved.

## 2022-04-13 DIAGNOSIS — H52223 Regular astigmatism, bilateral: Secondary | ICD-10-CM | POA: Diagnosis not present

## 2022-04-13 DIAGNOSIS — H02834 Dermatochalasis of left upper eyelid: Secondary | ICD-10-CM | POA: Diagnosis not present

## 2022-04-13 DIAGNOSIS — Z961 Presence of intraocular lens: Secondary | ICD-10-CM | POA: Diagnosis not present

## 2022-04-13 DIAGNOSIS — H02105 Unspecified ectropion of left lower eyelid: Secondary | ICD-10-CM | POA: Diagnosis not present

## 2022-04-13 DIAGNOSIS — H02109 Unspecified ectropion of unspecified eye, unspecified eyelid: Secondary | ICD-10-CM | POA: Diagnosis not present

## 2022-04-13 DIAGNOSIS — H32 Chorioretinal disorders in diseases classified elsewhere: Secondary | ICD-10-CM | POA: Diagnosis not present

## 2022-04-13 DIAGNOSIS — H02831 Dermatochalasis of right upper eyelid: Secondary | ICD-10-CM | POA: Diagnosis not present

## 2022-04-15 NOTE — Progress Notes (Unsigned)
Cardiology Office Note  Date: 04/16/2022   ID: [name], DOB [birth year], MRN [id number]  PCP:  Lavella Lemons, PA  Cardiologist:  Rozann Lesches, MD Electrophysiologist:  None   Chief Complaint  Patient presents with   Cardiac follow-up    History of Present Illness: [name] is an 86 y.o. male that I met in April.  He is here for a follow-up visit.  Reports NYHA class II-III dyspnea, dependent leg edema that is worse by the end of the day, no exertional angina.  His weight is up 5 pounds from May.  Echocardiogram from May revealed LVEF 40 to 45% range with wall motion abnormalities suggestive of ischemic heart disease, mild mitral regurgitation, sclerotic aortic valve without stenosis, and trivial pericardial effusion.  He has persistent atrial fibrillation, being managed as permanent atrial fibrillation at this point with rate control.  Heart rate 90-100 by my check at rest today.  We did uptitrate his Toprol-XL previously.  He does not report any bleeding problems on Eliquis.  I went over his medications and we discussed adjustment in GDMT related to his cardiomyopathy, anticipating conservative management at this time.  He had recent lab work through the [facility] system which we are requesting.  Past Medical History:  Diagnosis Date   Arthritis    neck, back, feet and ankles, feet and ankles swell due to arthritis, prop feet up swelling goes down   Bronchiectasis without complication (HCC)    bronchitis oct 2019 got clearance for surgery   COPD (chronic obstructive pulmonary disease) with chronic bronchitis    pulmologist-  dr Tarri Fuller young   GERD (gastroesophageal reflux disease)    History of colon polyps    2007--  simple adenoma   History of gastritis    SECONDARY TO ADVIL   History of skin cancer    EXCISION LEFT SIDE OF FACE and neck   Hypertension    Insomnia    Nocturia    Osteoporosis    Prostate cancer ([place])    dx  2009--  XRT takine  lupron shots for last shot oct 2019    Past Surgical History:  Procedure Laterality Date   CATARACT EXTRACTION W/ INTRAOCULAR LENS  IMPLANT, [place] 2007 D25 V1   Simple adenomas(RECTUM), HYPERPLASTIC COLON POLYPS, [place] TICS   COLONOSCOPY  03/18/2011   Procedure: COLONOSCOPY;  Surgeon: Dorothyann Peng, MD;  [facility];  Service: Endoscopy;  Laterality: N/A;  8:30   CRYOABLATION N/A 12/29/2013   Procedure: CRYO ABLATION PROSTATE;  Surgeon: Arvil Persons, MD;  Location: [facility];  Service: Urology;  Laterality: N/A;   CYSTOSCOPY N/A 04/29/2018   Procedure: Erlene Quan;  Surgeon: Festus Aloe, MD;  Location: [facility];  Service: Urology;  Laterality: N/A;   skin cancers removed  2018   left arm   UPPER GASTROINTESTINAL ENDOSCOPY  2007 DYSPEPSIA D50 V4   NSAID GASTRITIS    Current Outpatient Medications  Medication Sig Dispense Refill   albuterol (PROVENTIL HFA;VENTOLIN HFA) 108 (90 BASE) MCG/ACT inhaler Inhale 2 puffs into the lungs every 6 (six) hours as needed.     alendronate (FOSAMAX) 70 MG tablet Take 1 tablet (70 mg total) by mouth once a week. Take with a full glass of water on an empty stomach. 12 tablet 1   apixaban (ELIQUIS) 5 MG TABS tablet TAKE ONE (1) TABLET BY  MOUTH TWO (2) TIMES DAILY 60 tablet 5   budesonide-formoterol (SYMBICORT) 160-4.5 MCG/ACT inhaler Inhale 2 puffs into the lungs 2 (two) times daily.     diphenhydramine-acetaminophen (TYLENOL PM) 25-500 MG TABS tablet Take 1 tablet by mouth at bedtime.     doxazosin (CARDURA) 4 MG tablet TAKE 1 TABLET AT BEDTIME 90 tablet 0   ergocalciferol (VITAMIN D2) 1.25 MG (50000 UT) capsule Take 1 capsule (50,000 Units total) by mouth once a week. Takes on monday 5 capsule 5   hydrochlorothiazide (HYDRODIURIL) 25 MG tablet Take 1 tablet (25 mg total) by mouth every morning. 90 tablet 1   leuprolide (LUPRON) 22.5 MG injection Inject 22.5 mg  into the muscle every 3 (three) months.     losartan (COZAAR) 100 MG tablet TAKE 1 TABLET (100 MG TOTAL) BY MOUTH EVERY MORNING. 90 tablet 0   MAGNESIUM PO Take 500 mg by mouth daily.     Melatonin 12 MG TABS Take 1 tablet by mouth at bedtime. 90 tablet 3   Multiple Vitamin (MULTIVITAMIN) capsule Take 1 capsule by mouth daily.     omeprazole (PRILOSEC) 20 MG capsule Take 1 capsule (20 mg total) by mouth every morning. 90 capsule 1   Red Yeast Rice 600 MG CAPS Take 1 capsule by mouth every morning.     Tiotropium Bromide Monohydrate (SPIRIVA RESPIMAT) 2.5 MCG/ACT AERS Inhale 2 puffs into the lungs daily. 4 g 0   metoprolol succinate (TOPROL-XL) 50 MG 24 hr tablet Take 1 tablet (50 mg total) by mouth in the morning and at bedtime. Take with or immediately following a meal. 180 tablet 1   No current facility-administered medications for this visit.   Allergies:  Diclofenac, Guaifenesin er, and Roflumilast   ROS: No orthopnea or PND.  Physical Exam: VS:  BP 118/80   Pulse 91   Ht 5' 7.5" (1.715 m)   Wt 187 lb 6.4 oz (85 kg)   SpO2 93%   BMI 28.92 kg/m , BMI Body mass index is 28.92 kg/m.  Wt Readings from Last 3 Encounters:  04/16/22 187 lb 6.4 oz (85 kg)  12/19/21 187 lb 3.2 oz (84.9 kg)  11/05/21 182 lb 6.4 oz (82.7 kg)    General: Patient appears comfortable at rest. HEENT: Conjunctiva and lids normal. Neck: Supple, no elevated JVP or carotid bruits. Lungs: Decreased breath sounds without wheezing. Cardiac: Irregularly irregular, no S3, 1/6 systolic murmur. Abdomen: Soft, nontender, bowel sounds present. Extremities: 2+ leg edema.  ECG:  An ECG dated 10/09/2021 was personally reviewed today and demonstrated:  Atrial fibrillation.  Recent Labwork: 11/05/2021: BUN 27; Creatinine, Ser 1.09; Potassium 4.4; Pro B Natriuretic peptide (BNP) 387.0; Sodium 140     Component Value Date/Time   CHOL 208 (H) 06/13/2019 1438   TRIG 155 (H) 06/13/2019 1438   HDL 56 06/13/2019 1438    CHOLHDL 3.7 06/13/2019 1438   LDLCALC 125 (H) 06/13/2019 1438    Other Studies Reviewed Today:  Echocardiogram 11/18/2021:  1. Left ventricular ejection fraction, by estimation, is 40 to 45%. The  left ventricle has mildly decreased function. The left ventricle  demonstrates regional wall motion abnormalities (see scoring  diagram/findings for description). There is mild  asymmetric left ventricular hypertrophy of the basal segment. Left  ventricular diastolic parameters are indeterminate.   2. Right ventricular systolic function is low normal. The right  ventricular size is normal. There is normal pulmonary artery systolic  pressure. The estimated right ventricular systolic pressure is 75.1 mmHg.  3. Left atrial size was mildly dilated.   4. There is a trivial pericardial effusion posterior to the left  ventricle.   5. The mitral valve is grossly normal. Mild mitral valve regurgitation.   6. The aortic valve is tricuspid. There is moderate calcification of the  aortic valve. Aortic valve regurgitation is mild. Aortic regurgitation PHT  measures 615 msec.   7. The inferior vena cava is normal in size with greater than 50%  respiratory variability, suggesting right atrial pressure of 3 mmHg.   Assessment and Plan:  1.  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  He does not describe any angina at this time.  Plan will be attempted optimization of GDMT and overall conservative management without invasive work-up.  Not on aspirin given use of Eliquis.  Continue losartan and Toprol-XL.  Plan to add SGLT2 inhibitor next depending on insurance coverage.  Requesting recent lab work from [facility] system.  2.  Permanent atrial fibrillation, heart rate control not optimal, increase Toprol-XL to 50 mg twice daily as tolerated.  Continue Eliquis for stroke prophylaxis.  CHA2DS2-VASc score is 4.  3.  Essential  hypertension, blood pressure well controlled today.  Medication Adjustments/Labs and Tests Ordered: Current medicines are reviewed at length with the patient today.  Concerns regarding medicines are outlined above.   Tests Ordered: No orders of the defined types were placed in this encounter.   Medication Changes: Meds ordered this encounter  Medications   DISCONTD: metoprolol succinate (TOPROL-XL) 50 MG 24 hr tablet    Sig: Take 1 tablet (50 mg total) by mouth daily. Take with or immediately following a meal.    Dispense:  90 tablet    Refill:  1    04/16/22 Dose decrease   metoprolol succinate (TOPROL-XL) 50 MG 24 hr tablet    Sig: Take 1 tablet (50 mg total) by mouth in the morning and at bedtime. Take with or immediately following a meal.    Dispense:  180 tablet    Refill:  1    04/16/22 Dose decrease-Please disregard previous prescription for 50 mg once a day    Disposition:  Follow up  3 months.  Signed, Satira Sark, MD, [facility] 04/16/2022 11:27 AM    [facility] [address], [place], [place] [address] Phone: [phone]; Fax: [phone]

## 2022-04-16 ENCOUNTER — Ambulatory Visit: Payer: Medicare HMO | Attending: Cardiology | Admitting: Cardiology

## 2022-04-16 ENCOUNTER — Encounter: Payer: Self-pay | Admitting: Cardiology

## 2022-04-16 VITALS — BP 118/80 | HR 91 | Ht 67.5 in | Wt 187.4 lb

## 2022-04-16 DIAGNOSIS — I4819 Other persistent atrial fibrillation: Secondary | ICD-10-CM

## 2022-04-16 DIAGNOSIS — I10 Essential (primary) hypertension: Secondary | ICD-10-CM

## 2022-04-16 DIAGNOSIS — I5022 Chronic systolic (congestive) heart failure: Secondary | ICD-10-CM

## 2022-04-16 MED ORDER — METOPROLOL SUCCINATE ER 50 MG PO TB24: 50.0000 mg | ORAL_TABLET | Freq: Two times a day (BID) | ORAL | 1 refills | Status: DC

## 2022-04-16 MED ORDER — METOPROLOL SUCCINATE ER 50 MG PO TB24: 50.0000 mg | ORAL_TABLET | Freq: Every day | ORAL | 1 refills | Status: DC

## 2022-04-16 NOTE — Patient Instructions (Addendum)
Medication Instructions:  Your physician has recommended you make the following change in your medication:  increase Toprol to 50 mg twice a day Continue all other medications as directed  Labwork: none  Testing/Procedures: none  Follow-Up:  Your physician recommends that you schedule a follow-up appointment in: 3 months  Any Other Special Instructions Will Be Listed Below (If Applicable).  If you need a refill on your cardiac medications before your next appointment, please call your pharmacy.

## 2022-04-17 ENCOUNTER — Telehealth: Payer: Self-pay

## 2022-04-17 ENCOUNTER — Other Ambulatory Visit: Payer: Self-pay

## 2022-04-17 NOTE — Telephone Encounter (Signed)
Request for recent labs sent to [facility]

## 2022-04-21 ENCOUNTER — Telehealth: Payer: Self-pay

## 2022-04-21 NOTE — Telephone Encounter (Signed)
-----   Message from Satira Sark, MD sent at 04/21/2022  4:25 PM EDT ----- Sounds good.  Let's go with Jardiance 10 mg daily if he is in agreement. ----- Message ----- From: Sung Amabile, CMA Sent: 04/21/2022   4:23 PM EDT To: Satira Sark, MD  Per PharmD, patients insurance should cover jardiance. ----- Message ----- From: Anda Latina, [facility] Sent: 04/21/2022   1:21 PM EDT To: Sung Amabile, CMA  I believe Jardiance would be! ----- Message ----- From: Sung Amabile, CMA Sent: 04/17/2022   1:51 PM EDT To: [facility]  Can you tell me if the patients insurance covers jardiance 10 mg or farxiga 10 mg?

## 2022-04-21 NOTE — Telephone Encounter (Signed)
Called patient and left message for him to call the office.

## 2022-04-22 MED ORDER — EMPAGLIFLOZIN 10 MG PO TABS: 10.0000 mg | ORAL_TABLET | Freq: Every day | ORAL | 5 refills | Status: DC

## 2022-04-22 NOTE — Telephone Encounter (Signed)
Pt called back stating he is just going to try it for 30 days

## 2022-04-22 NOTE — Telephone Encounter (Signed)
Pt is returning call stating that medication is too expensive. Tried transferring to [place], [place], but patient hung up. Notified Lak[place] and she stated she would call patient back.

## 2022-04-22 NOTE — Telephone Encounter (Signed)
Patient made aware. States that he is ok with starting this medication but will not be able to afford if it is expensive. States that he is maxed out with his insurance and currently spending out of his savings due to the fact that he is seeing a lot of doctors but will check with the pharmacy. Informed patient that if this medication is too expensive to please let us know so that we may start the patient assistance process. Patient verbalized understanding. Per Dr. Domenic Polite, Jardiance 10 mg once a day sent to pharmacy.

## 2022-04-22 NOTE — Telephone Encounter (Signed)
Patient states that he is going to pay for the medication if this is something that he needs be on and that he should be out of the donut hole with his insurance around the beginning of the year. States that he does not want to apply for patient assistance because he has been using funds from his IRA.

## 2022-04-27 ENCOUNTER — Other Ambulatory Visit: Payer: Self-pay | Admitting: *Deleted

## 2022-04-27 MED ORDER — APIXABAN 5 MG PO TABS: ORAL_TABLET | ORAL | 3 refills | Status: DC

## 2022-05-04 ENCOUNTER — Encounter: Payer: Self-pay | Admitting: *Deleted

## 2022-05-04 NOTE — Progress Notes (Signed)
Faxed notification received from [employer] that Eliquis 5 mg BID approved through 06/21/2022

## 2022-05-07 DIAGNOSIS — H43811 Vitreous degeneration, right eye: Secondary | ICD-10-CM | POA: Diagnosis not present

## 2022-05-07 DIAGNOSIS — H353132 Nonexudative age-related macular degeneration, bilateral, intermediate dry stage: Secondary | ICD-10-CM | POA: Diagnosis not present

## 2022-05-07 DIAGNOSIS — H31093 Other chorioretinal scars, bilateral: Secondary | ICD-10-CM | POA: Diagnosis not present

## 2022-05-07 DIAGNOSIS — B399 Histoplasmosis, unspecified: Secondary | ICD-10-CM | POA: Diagnosis not present

## 2022-05-21 ENCOUNTER — Encounter: Payer: Self-pay | Admitting: *Deleted

## 2022-05-21 NOTE — Progress Notes (Signed)
Fax notification received from [employer] that patient assistance for Jardiance 10 mg has been approved 04/29/2022 through 06/21/2023

## 2022-05-25 ENCOUNTER — Telehealth: Payer: Self-pay | Admitting: Cardiology

## 2022-05-25 NOTE — Telephone Encounter (Incomplete)
Pt c/o medication issue:  1. Name of Medication: ***  2. How are you currently taking this medication (dosage and times per day)? ***  3. Are you having a reaction (difficulty breathing--STAT)? no  4. What is your medication issue? Patient calling in because he said the meds he is on is not doing the job. States that his bp is 121/66 hr 107. States that his feels exteremly lightheaded and with no energy. Please advise

## 2022-06-09 ENCOUNTER — Other Ambulatory Visit: Payer: Self-pay | Admitting: *Deleted

## 2022-06-09 MED ORDER — APIXABAN 5 MG PO TABS: ORAL_TABLET | ORAL | 3 refills | Status: DC

## 2022-06-17 ENCOUNTER — Encounter: Payer: Self-pay | Admitting: *Deleted

## 2022-06-17 NOTE — Progress Notes (Signed)
Fax notification received from [employer] that Eliquis 5 mg BID approved from 06/22/2022-06/22/2023

## 2022-06-18 DIAGNOSIS — R9721 Rising PSA following treatment for malignant neoplasm of prostate: Secondary | ICD-10-CM | POA: Diagnosis not present

## 2022-06-23 ENCOUNTER — Ambulatory Visit: Payer: Medicare HMO | Admitting: Internal Medicine

## 2022-07-09 DIAGNOSIS — R350 Frequency of micturition: Secondary | ICD-10-CM | POA: Diagnosis not present

## 2022-07-09 DIAGNOSIS — C61 Malignant neoplasm of prostate: Secondary | ICD-10-CM | POA: Diagnosis not present

## 2022-07-27 ENCOUNTER — Other Ambulatory Visit: Payer: Self-pay | Admitting: Cardiology

## 2022-07-31 ENCOUNTER — Ambulatory Visit: Payer: Medicare HMO | Attending: Cardiology | Admitting: Cardiology

## 2022-07-31 ENCOUNTER — Encounter: Payer: Self-pay | Admitting: Cardiology

## 2022-07-31 VITALS — BP 130/66 | HR 79 | Ht 70.0 in | Wt 188.6 lb

## 2022-07-31 DIAGNOSIS — I4819 Other persistent atrial fibrillation: Secondary | ICD-10-CM

## 2022-07-31 DIAGNOSIS — I5022 Chronic systolic (congestive) heart failure: Secondary | ICD-10-CM | POA: Diagnosis not present

## 2022-07-31 NOTE — Progress Notes (Signed)
Cardiology Office Note  Date: 07/31/2022   ID: MARKAEL HYDER, DOB [birth year], MRN [id number]  PCP:  Lavella Lemons, PA  Cardiologist:  Rozann Lesches, MD Electrophysiologist:  None   Chief Complaint  Patient presents with   Cardiac follow-up    History of Present Illness: [name] is an 86 y.o. male last seen in October 2023.  He is here for a follow-up visit.  Reports chronic dyspnea on exertion, NYHA class II-III, relatively stable and also in the setting of COPD with chronic bronchitis.  His weight is relatively stable on current cardiac regimen.  He does not report any obvious angina.  I reviewed his cardiac regimen which is outlined below.  He does not report any spontaneous bleeding problems on Eliquis.  Tolerating Toprol-XL, Cozaar, Jardiance, and HCTZ otherwise.  He is on red yeast rice, declines statin therapy.  He reports an occasional sense of palpitations at nighttime, no dizziness or syncope.  Past Medical History:  Diagnosis Date   Arthritis    neck, back, feet and ankles, feet and ankles swell due to arthritis, prop feet up swelling goes down   Bronchiectasis without complication (HCC)    bronchitis oct 2019 got clearance for surgery   COPD (chronic obstructive pulmonary disease) with chronic bronchitis    pulmologist-  dr Tarri Fuller young   GERD (gastroesophageal reflux disease)    History of colon polyps    2007--  simple adenoma   History of gastritis    SECONDARY TO ADVIL   History of skin cancer    EXCISION LEFT SIDE OF FACE and neck   Hypertension    Insomnia    Nocturia    Osteoporosis    Prostate cancer ([place])    dx  2009--  XRT takine lupron shots for last shot oct 2019    Current Outpatient Medications  Medication Sig Dispense Refill   albuterol (PROVENTIL HFA;VENTOLIN HFA) 108 (90 BASE) MCG/ACT inhaler Inhale 2 puffs into the lungs every 6 (six) hours as needed.     alendronate (FOSAMAX) 70 MG tablet Take 1 tablet (70 mg total) by mouth  once a week. Take with a full glass of water on an empty stomach. 12 tablet 1   apixaban (ELIQUIS) 5 MG TABS tablet TAKE ONE (1) TABLET BY MOUTH TWO (2) TIMES DAILY 180 tablet 3   budesonide-formoterol (SYMBICORT) 160-4.5 MCG/ACT inhaler Inhale 2 puffs into the lungs 2 (two) times daily.     diphenhydramine-acetaminophen (TYLENOL PM) 25-500 MG TABS tablet Take 1 tablet by mouth at bedtime.     doxazosin (CARDURA) 4 MG tablet TAKE 1 TABLET AT BEDTIME 90 tablet 0   empagliflozin (JARDIANCE) 10 MG TABS tablet Take 1 tablet (10 mg total) by mouth daily before breakfast. 30 tablet 5   ergocalciferol (VITAMIN D2) 1.25 MG (50000 UT) capsule Take 1 capsule (50,000 Units total) by mouth once a week. Takes on monday 5 capsule 5   hydrochlorothiazide (HYDRODIURIL) 25 MG tablet Take 1 tablet (25 mg total) by mouth every morning. 90 tablet 1   leuprolide (LUPRON) 22.5 MG injection Inject 22.5 mg into the muscle every 3 (three) months.     losartan (COZAAR) 100 MG tablet TAKE 1 TABLET (100 MG TOTAL) BY MOUTH EVERY MORNING. 90 tablet 0   MAGNESIUM PO Take 500 mg by mouth daily.     Melatonin 12 MG TABS Take 1 tablet by mouth at bedtime. 90 tablet 3   metoprolol succinate (TOPROL-XL) 50 MG  24 hr tablet TAKE 1 TABLET BY MOUTH EVERY MORNING & TAKE 1 TABLET BY MOUTH AT BEDTIME (TAKE WITH FOOD) 180 tablet 1   Multiple Vitamin (MULTIVITAMIN) capsule Take 1 capsule by mouth daily.     omeprazole (PRILOSEC) 20 MG capsule Take 1 capsule (20 mg total) by mouth every morning. 90 capsule 1   Red Yeast Rice 600 MG CAPS Take 1 capsule by mouth every morning.     tolterodine (DETROL LA) 4 MG 24 hr capsule Take 4 mg by mouth daily.     No current facility-administered medications for this visit.   Allergies:  Diclofenac, Guaifenesin er, and Roflumilast   ROS: No orthopnea or PND.  Physical Exam: VS:  BP 130/66   Pulse 79   Ht 5' 10"$  (1.778 m)   Wt 188 lb 9.6 oz (85.5 kg)   SpO2 96%   BMI 27.06 kg/m , BMI Body mass  index is 27.06 kg/m.  Wt Readings from Last 3 Encounters:  07/31/22 188 lb 9.6 oz (85.5 kg)  04/16/22 187 lb 6.4 oz (85 kg)  12/19/21 187 lb 3.2 oz (84.9 kg)    General: Patient appears comfortable at rest. HEENT: Conjunctiva and lids normal. Neck: Supple, no elevated JVP or carotid bruits. Lungs: Decreased breath sounds with prolonged expiratory phase. Cardiac: Irregularly irregular, 1/6 systolic murmur, no gallop. Extremities: Mild lower leg edema.  ECG:  An ECG dated 10/09/2021 was personally reviewed today and demonstrated:  Atrial fibrillation.  Recent Labwork: 11/05/2021: BUN 27; Creatinine, Ser 1.09; Potassium 4.4; Pro B Natriuretic peptide (BNP) 387.0; Sodium 140     Component Value Date/Time   CHOL 208 (H) 06/13/2019 1438   TRIG 155 (H) 06/13/2019 1438   HDL 56 06/13/2019 1438   CHOLHDL 3.7 06/13/2019 1438   LDLCALC 125 (H) 06/13/2019 1438  October 2023: Hemoglobin 14.7, platelets 205, hemoglobin A1c 5.8%, BUN 23, creatinine 0.98, potassium 4.3, TSH 1.85, LDL 115, cholesterol 193, triglycerides 97, HDL 59, AST 18, ALT 20  Other Studies Reviewed Today:  Echocardiogram 11/18/2021:  1. Left ventricular ejection fraction, by estimation, is 40 to 45%. The  left ventricle has mildly decreased function. The left ventricle  demonstrates regional wall motion abnormalities (see scoring  diagram/findings for description). There is mild  asymmetric left ventricular hypertrophy of the basal segment. Left  ventricular diastolic parameters are indeterminate.   2. Right ventricular systolic function is low normal. The right  ventricular size is normal. There is normal pulmonary artery systolic  pressure. The estimated right ventricular systolic pressure is [id number] mmHg.   3. Left atrial size was mildly dilated.   4. There is a trivial pericardial effusion posterior to the left  ventricle.   5. The mitral valve is grossly normal. Mild mitral valve regurgitation.   6. The aortic valve is  tricuspid. There is moderate calcification of the  aortic valve. Aortic valve regurgitation is mild. Aortic regurgitation PHT  measures 615 msec.   7. The inferior vena cava is normal in size with greater than 50%  respiratory variability, suggesting right atrial pressure of 3 mmHg.   Assessment and Plan:  1.  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  Clinically stable with chronic NYHA class II-III dyspnea, no significant weight change.  Continue Toprol-XL, losartan, Jardiance, and HCTZ.  2.  Permanent atrial fibrillation with CHA2DS2-VASc score of 4.  He remains on Toprol-XL for heart rate control and Eliquis for stroke prophylaxis.  I reviewed  his lab work from October 2023.  Medication Adjustments/Labs and Tests Ordered: Current medicines are reviewed at length with the patient today.  Concerns regarding medicines are outlined above.   Tests Ordered: No orders of the defined types were placed in this encounter.   Medication Changes: No orders of the defined types were placed in this encounter.   Disposition:  Follow up  6 months.  Signed, Satira Sark, MD, [facility] 07/31/2022 3:10 PM    [facility] [place], [place], [place] [address] Phone: [phone]; Fax: [phone]

## 2022-07-31 NOTE — Patient Instructions (Addendum)

## 2022-08-06 DIAGNOSIS — H43813 Vitreous degeneration, bilateral: Secondary | ICD-10-CM | POA: Diagnosis not present

## 2022-08-06 DIAGNOSIS — H353211 Exudative age-related macular degeneration, right eye, with active choroidal neovascularization: Secondary | ICD-10-CM | POA: Diagnosis not present

## 2022-08-06 DIAGNOSIS — H353122 Nonexudative age-related macular degeneration, left eye, intermediate dry stage: Secondary | ICD-10-CM | POA: Diagnosis not present

## 2022-08-24 DIAGNOSIS — I10 Essential (primary) hypertension: Secondary | ICD-10-CM | POA: Diagnosis not present

## 2022-08-24 DIAGNOSIS — R7303 Prediabetes: Secondary | ICD-10-CM | POA: Diagnosis not present

## 2022-08-24 DIAGNOSIS — Z13220 Encounter for screening for lipoid disorders: Secondary | ICD-10-CM | POA: Diagnosis not present

## 2022-08-24 DIAGNOSIS — I480 Paroxysmal atrial fibrillation: Secondary | ICD-10-CM | POA: Diagnosis not present

## 2022-08-24 DIAGNOSIS — M47812 Spondylosis without myelopathy or radiculopathy, cervical region: Secondary | ICD-10-CM | POA: Diagnosis not present

## 2022-08-24 DIAGNOSIS — J449 Chronic obstructive pulmonary disease, unspecified: Secondary | ICD-10-CM | POA: Diagnosis not present

## 2022-08-24 DIAGNOSIS — M47816 Spondylosis without myelopathy or radiculopathy, lumbar region: Secondary | ICD-10-CM | POA: Diagnosis not present

## 2022-08-24 DIAGNOSIS — C61 Malignant neoplasm of prostate: Secondary | ICD-10-CM | POA: Diagnosis not present

## 2022-08-24 DIAGNOSIS — M810 Age-related osteoporosis without current pathological fracture: Secondary | ICD-10-CM | POA: Diagnosis not present

## 2022-09-03 DIAGNOSIS — H353211 Exudative age-related macular degeneration, right eye, with active choroidal neovascularization: Secondary | ICD-10-CM | POA: Diagnosis not present

## 2022-09-03 DIAGNOSIS — H353122 Nonexudative age-related macular degeneration, left eye, intermediate dry stage: Secondary | ICD-10-CM | POA: Diagnosis not present

## 2022-09-03 DIAGNOSIS — H31013 Macula scars of posterior pole (postinflammatory) (post-traumatic), bilateral: Secondary | ICD-10-CM | POA: Diagnosis not present

## 2022-09-03 DIAGNOSIS — H43813 Vitreous degeneration, bilateral: Secondary | ICD-10-CM | POA: Diagnosis not present

## 2022-09-20 DIAGNOSIS — I480 Paroxysmal atrial fibrillation: Secondary | ICD-10-CM | POA: Diagnosis not present

## 2022-09-20 DIAGNOSIS — J449 Chronic obstructive pulmonary disease, unspecified: Secondary | ICD-10-CM | POA: Diagnosis not present

## 2022-10-15 DIAGNOSIS — B399 Histoplasmosis, unspecified: Secondary | ICD-10-CM | POA: Diagnosis not present

## 2022-10-15 DIAGNOSIS — H31013 Macula scars of posterior pole (postinflammatory) (post-traumatic), bilateral: Secondary | ICD-10-CM | POA: Diagnosis not present

## 2022-10-15 DIAGNOSIS — H43813 Vitreous degeneration, bilateral: Secondary | ICD-10-CM | POA: Diagnosis not present

## 2022-10-15 DIAGNOSIS — H353211 Exudative age-related macular degeneration, right eye, with active choroidal neovascularization: Secondary | ICD-10-CM | POA: Diagnosis not present

## 2022-10-15 DIAGNOSIS — R6889 Other general symptoms and signs: Secondary | ICD-10-CM | POA: Diagnosis not present

## 2022-10-15 DIAGNOSIS — H353122 Nonexudative age-related macular degeneration, left eye, intermediate dry stage: Secondary | ICD-10-CM | POA: Diagnosis not present

## 2022-10-20 DIAGNOSIS — J449 Chronic obstructive pulmonary disease, unspecified: Secondary | ICD-10-CM | POA: Diagnosis not present

## 2022-10-20 DIAGNOSIS — I5022 Chronic systolic (congestive) heart failure: Secondary | ICD-10-CM | POA: Diagnosis not present

## 2022-12-17 DIAGNOSIS — H43813 Vitreous degeneration, bilateral: Secondary | ICD-10-CM | POA: Diagnosis not present

## 2022-12-17 DIAGNOSIS — H353122 Nonexudative age-related macular degeneration, left eye, intermediate dry stage: Secondary | ICD-10-CM | POA: Diagnosis not present

## 2022-12-17 DIAGNOSIS — R6889 Other general symptoms and signs: Secondary | ICD-10-CM | POA: Diagnosis not present

## 2022-12-17 DIAGNOSIS — H353211 Exudative age-related macular degeneration, right eye, with active choroidal neovascularization: Secondary | ICD-10-CM | POA: Diagnosis not present

## 2022-12-17 DIAGNOSIS — H31013 Macula scars of posterior pole (postinflammatory) (post-traumatic), bilateral: Secondary | ICD-10-CM | POA: Diagnosis not present

## 2022-12-17 DIAGNOSIS — B399 Histoplasmosis, unspecified: Secondary | ICD-10-CM | POA: Diagnosis not present

## 2022-12-31 DIAGNOSIS — R972 Elevated prostate specific antigen [PSA]: Secondary | ICD-10-CM | POA: Diagnosis not present

## 2023-01-05 ENCOUNTER — Other Ambulatory Visit: Payer: Self-pay | Admitting: Cardiology

## 2023-01-07 DIAGNOSIS — R350 Frequency of micturition: Secondary | ICD-10-CM | POA: Diagnosis not present

## 2023-01-07 DIAGNOSIS — C61 Malignant neoplasm of prostate: Secondary | ICD-10-CM | POA: Diagnosis not present

## 2023-02-04 ENCOUNTER — Encounter: Payer: Self-pay | Admitting: Cardiology

## 2023-02-04 ENCOUNTER — Ambulatory Visit: Payer: Medicare HMO | Attending: Cardiology | Admitting: Cardiology

## 2023-02-04 VITALS — BP 110/64 | HR 85 | Ht 70.0 in | Wt 188.4 lb

## 2023-02-04 DIAGNOSIS — I10 Essential (primary) hypertension: Secondary | ICD-10-CM

## 2023-02-04 DIAGNOSIS — I5022 Chronic systolic (congestive) heart failure: Secondary | ICD-10-CM

## 2023-02-04 DIAGNOSIS — I4819 Other persistent atrial fibrillation: Secondary | ICD-10-CM

## 2023-02-04 NOTE — Addendum Note (Signed)
Addended by: Marlyn Corporal A on: 02/04/2023 11:08 AM   Modules accepted: Orders

## 2023-02-04 NOTE — Patient Instructions (Signed)
Medication Instructions:  Your physician recommends that you continue on your current medications as directed. Please refer to the Current Medication list given to you today.   Labwork: None today  Testing/Procedures: None today  Follow-Up: 6 months  Any Other Special Instructions Will Be Listed Below (If Applicable).  If you need a refill on your cardiac medications before your next appointment, please call your pharmacy.  

## 2023-02-04 NOTE — Progress Notes (Signed)
Cardiology Office Note  Date: 02/04/2023   ID: [name], DOB 06-28-1935, MRN [id number]  History of Present Illness: [name] is an 87 y.o. male last seen in February.  He is here for a routine visit.  Reports chronic dyspnea on exertion, NYHA class II-III, no exertional angina or palpitations, no syncope.  I reviewed his medications.  Cardiac regimen includes Eliquis, Jardiance, Cozaar, Toprol-XL, and red yeast rice supplements.  He has a history of statin intolerance.  He will have lab work with PCP in the next month.  Denies any spontaneous bleeding problems or changes in stools.  ECG today shows rate controlled atrial fibrillation with nonspecific T wave changes.  Physical Exam: VS:  BP 110/64   Pulse 85   Ht 5\' 10"  (1.778 m)   Wt 188 lb 6.4 oz (85.5 kg)   SpO2 95%   BMI 27.03 kg/m , BMI Body mass index is 27.03 kg/m.  Wt Readings from Last 3 Encounters:  02/04/23 188 lb 6.4 oz (85.5 kg)  07/31/22 188 lb 9.6 oz (85.5 kg)  04/16/22 187 lb 6.4 oz (85 kg)    General: Patient appears comfortable at rest. HEENT: Conjunctiva and lids normal. Neck: Supple, no elevated JVP or carotid bruits. Lungs: Decreased sounds without wheezing, nonlabored breathing at rest. Cardiac: Irregularly irregular, 1/6 systolic murmur, no gallop. Extremities: Mild ankle edema, stable.  ECG:  An ECG dated 10/09/2021 was personally reviewed today and demonstrated:  Atrial fibrillation.  Labwork:  October 2023: Hemoglobin A1c 5.8%, hemoglobin 14.7, platelets 205, BUN 23, creatinine 0.98, potassium 4.3, AST 28, ALT 20, cholesterol 193, triglycerides 97, HDL 59, LDL 115, TSH 1.85  Other Studies Reviewed Today:  Echocardiogram 11/18/2021:  1. Left ventricular ejection fraction, by estimation, is 40 to 45%. The  left ventricle has mildly decreased function. The left ventricle  demonstrates regional wall motion abnormalities (see scoring  diagram/findings for description). There is mild   asymmetric left ventricular hypertrophy of the basal segment. Left  ventricular diastolic parameters are indeterminate.   2. Right ventricular systolic function is low normal. The right  ventricular size is normal. There is normal pulmonary artery systolic  pressure. The estimated right ventricular systolic pressure is 32.2 mmHg.   3. Left atrial size was mildly dilated.   4. There is a trivial pericardial effusion posterior to the left  ventricle.   5. The mitral valve is grossly normal. Mild mitral valve regurgitation.   6. The aortic valve is tricuspid. There is moderate calcification of the  aortic valve. Aortic valve regurgitation is mild. Aortic regurgitation PHT  measures 615 msec.   7. The inferior vena cava is normal in size with greater than 50%  respiratory variability, suggesting right atrial pressure of 3 mmHg.   Assessment and Plan:  1.  HFmrEF, LVEF 40 to 45% by echocardiogram in May 2023 with suspected ischemic cardiomyopathy and plan for conservative management.  Reports stable NYHA class II-III dyspnea at this time, likely multifactorial in the setting of concurrent COPD and bronchiectasis.  Continue Toprol-XL, Cozaar, and Jardiance.   2.  Permanent atrial fibrillation with CHA2DS2-VASc score of 4.  Asymptomatic in terms of palpitations and heart rate controlled today on Toprol-XL.  Continue Eliquis for stroke prophylaxis.  He will have lab work with PCP in the next month.  3.  Essential hypertension.  Blood pressure well-controlled today.  No changes made in current regimen.  Disposition:  Follow up  6 months.  Signed, Taylor Bolus  [name], M.D., F.A.C.C. [facility] at [facility]

## 2023-02-25 DIAGNOSIS — I480 Paroxysmal atrial fibrillation: Secondary | ICD-10-CM | POA: Diagnosis not present

## 2023-02-25 DIAGNOSIS — M810 Age-related osteoporosis without current pathological fracture: Secondary | ICD-10-CM | POA: Diagnosis not present

## 2023-02-25 DIAGNOSIS — Z0001 Encounter for general adult medical examination with abnormal findings: Secondary | ICD-10-CM | POA: Diagnosis not present

## 2023-02-25 DIAGNOSIS — C61 Malignant neoplasm of prostate: Secondary | ICD-10-CM | POA: Diagnosis not present

## 2023-02-25 DIAGNOSIS — J449 Chronic obstructive pulmonary disease, unspecified: Secondary | ICD-10-CM | POA: Diagnosis not present

## 2023-02-25 DIAGNOSIS — I10 Essential (primary) hypertension: Secondary | ICD-10-CM | POA: Diagnosis not present

## 2023-02-25 DIAGNOSIS — Z23 Encounter for immunization: Secondary | ICD-10-CM | POA: Diagnosis not present

## 2023-02-25 DIAGNOSIS — Z6828 Body mass index (BMI) 28.0-28.9, adult: Secondary | ICD-10-CM | POA: Diagnosis not present

## 2023-02-25 DIAGNOSIS — M47812 Spondylosis without myelopathy or radiculopathy, cervical region: Secondary | ICD-10-CM | POA: Diagnosis not present

## 2023-03-01 ENCOUNTER — Other Ambulatory Visit: Payer: Self-pay | Admitting: *Deleted

## 2023-03-01 MED ORDER — APIXABAN 5 MG PO TABS: ORAL_TABLET | ORAL | 1 refills | Status: AC

## 2023-03-01 MED ORDER — APIXABAN 5 MG PO TABS: ORAL_TABLET | ORAL | 1 refills | Status: DC

## 2023-03-01 NOTE — Addendum Note (Signed)
Addended by: Louanna Raw on: 03/01/2023 10:47 AM   Modules accepted: Orders

## 2023-03-01 NOTE — Telephone Encounter (Signed)
Prescription refill request for Eliquis received. Indication: PAF Last office visit: 02/04/23  Ival Bible MD Scr: 0.98 on 04/09/22  Epic Age: 87 Weight: 85.5kg   Based on above findings Eliquis 5mg  twice daily is the appropriate dose.  Refill approved.

## 2023-03-04 DIAGNOSIS — R6889 Other general symptoms and signs: Secondary | ICD-10-CM | POA: Diagnosis not present

## 2023-03-04 DIAGNOSIS — H31013 Macula scars of posterior pole (postinflammatory) (post-traumatic), bilateral: Secondary | ICD-10-CM | POA: Diagnosis not present

## 2023-03-04 DIAGNOSIS — H353122 Nonexudative age-related macular degeneration, left eye, intermediate dry stage: Secondary | ICD-10-CM | POA: Diagnosis not present

## 2023-03-04 DIAGNOSIS — H43813 Vitreous degeneration, bilateral: Secondary | ICD-10-CM | POA: Diagnosis not present

## 2023-03-04 DIAGNOSIS — H353211 Exudative age-related macular degeneration, right eye, with active choroidal neovascularization: Secondary | ICD-10-CM | POA: Diagnosis not present

## 2023-03-04 DIAGNOSIS — B399 Histoplasmosis, unspecified: Secondary | ICD-10-CM | POA: Diagnosis not present

## 2023-03-22 DIAGNOSIS — J449 Chronic obstructive pulmonary disease, unspecified: Secondary | ICD-10-CM | POA: Diagnosis not present

## 2023-03-22 DIAGNOSIS — I480 Paroxysmal atrial fibrillation: Secondary | ICD-10-CM | POA: Diagnosis not present

## 2023-03-22 DIAGNOSIS — I10 Essential (primary) hypertension: Secondary | ICD-10-CM | POA: Diagnosis not present

## 2023-03-22 DIAGNOSIS — E119 Type 2 diabetes mellitus without complications: Secondary | ICD-10-CM | POA: Diagnosis not present

## 2023-03-23 DIAGNOSIS — J449 Chronic obstructive pulmonary disease, unspecified: Secondary | ICD-10-CM | POA: Diagnosis not present

## 2023-03-23 DIAGNOSIS — I10 Essential (primary) hypertension: Secondary | ICD-10-CM | POA: Diagnosis not present

## 2023-03-23 DIAGNOSIS — E871 Hypo-osmolality and hyponatremia: Secondary | ICD-10-CM | POA: Diagnosis not present

## 2023-04-05 DIAGNOSIS — I10 Essential (primary) hypertension: Secondary | ICD-10-CM | POA: Diagnosis not present

## 2023-04-05 DIAGNOSIS — Z23 Encounter for immunization: Secondary | ICD-10-CM | POA: Diagnosis not present

## 2023-04-19 DIAGNOSIS — E871 Hypo-osmolality and hyponatremia: Secondary | ICD-10-CM | POA: Diagnosis not present

## 2023-04-19 DIAGNOSIS — J449 Chronic obstructive pulmonary disease, unspecified: Secondary | ICD-10-CM | POA: Diagnosis not present

## 2023-04-19 DIAGNOSIS — I10 Essential (primary) hypertension: Secondary | ICD-10-CM | POA: Diagnosis not present

## 2023-04-22 DIAGNOSIS — I10 Essential (primary) hypertension: Secondary | ICD-10-CM | POA: Diagnosis not present

## 2023-05-06 DIAGNOSIS — L570 Actinic keratosis: Secondary | ICD-10-CM | POA: Diagnosis not present

## 2023-05-06 DIAGNOSIS — C4442 Squamous cell carcinoma of skin of scalp and neck: Secondary | ICD-10-CM | POA: Diagnosis not present

## 2023-05-06 DIAGNOSIS — X32XXXD Exposure to sunlight, subsequent encounter: Secondary | ICD-10-CM | POA: Diagnosis not present

## 2023-05-13 DIAGNOSIS — B399 Histoplasmosis, unspecified: Secondary | ICD-10-CM | POA: Diagnosis not present

## 2023-05-13 DIAGNOSIS — H353211 Exudative age-related macular degeneration, right eye, with active choroidal neovascularization: Secondary | ICD-10-CM | POA: Diagnosis not present

## 2023-05-13 DIAGNOSIS — H353231 Exudative age-related macular degeneration, bilateral, with active choroidal neovascularization: Secondary | ICD-10-CM | POA: Diagnosis not present

## 2023-05-13 DIAGNOSIS — H353221 Exudative age-related macular degeneration, left eye, with active choroidal neovascularization: Secondary | ICD-10-CM | POA: Diagnosis not present

## 2023-05-13 DIAGNOSIS — R6889 Other general symptoms and signs: Secondary | ICD-10-CM | POA: Diagnosis not present

## 2023-05-13 DIAGNOSIS — H31013 Macula scars of posterior pole (postinflammatory) (post-traumatic), bilateral: Secondary | ICD-10-CM | POA: Diagnosis not present

## 2023-05-13 DIAGNOSIS — H43813 Vitreous degeneration, bilateral: Secondary | ICD-10-CM | POA: Diagnosis not present

## 2023-05-13 DIAGNOSIS — H353132 Nonexudative age-related macular degeneration, bilateral, intermediate dry stage: Secondary | ICD-10-CM | POA: Diagnosis not present

## 2023-05-27 ENCOUNTER — Telehealth: Payer: Self-pay | Admitting: Cardiology

## 2023-05-27 MED ORDER — EMPAGLIFLOZIN 10 MG PO TABS: 10.0000 mg | ORAL_TABLET | Freq: Every day | ORAL | 5 refills | Status: AC

## 2023-05-27 NOTE — Telephone Encounter (Signed)
Refill Complete

## 2023-05-27 NOTE — Telephone Encounter (Signed)
*  STAT* If patient is at the pharmacy, call can be transferred to refill team.   1. Which medications need to be refilled? (please list name of each medication and dose if known) empagliflozin (JARDIANCE) 10 MG TABS tablet   2. Which pharmacy/location (including street and city if local pharmacy) is medication to be sent to?  [facility] - [place], [place] - [address]      3. Do they need a 30 day or 90 day supply? 90 day

## 2023-06-10 DIAGNOSIS — R6889 Other general symptoms and signs: Secondary | ICD-10-CM | POA: Diagnosis not present

## 2023-06-10 DIAGNOSIS — H353221 Exudative age-related macular degeneration, left eye, with active choroidal neovascularization: Secondary | ICD-10-CM | POA: Diagnosis not present

## 2023-06-21 DIAGNOSIS — D044 Carcinoma in situ of skin of scalp and neck: Secondary | ICD-10-CM | POA: Diagnosis not present

## 2023-06-25 ENCOUNTER — Other Ambulatory Visit: Payer: Self-pay | Admitting: Cardiology

## 2023-07-06 ENCOUNTER — Telehealth: Payer: Self-pay | Admitting: Cardiology

## 2023-07-06 NOTE — Telephone Encounter (Signed)
 Patient states that he did get letter from [employer]  stating that he would not be covered until he met his 3% out of pocket.    Low income subsidy information / phone number given.  Explained to him that if he is approved that this will help with all his medications, but if denied to hold on to that letter so we can send to assistance company after he meets his 3 % if still needing assistance at that time.  He verbalized understanding.

## 2023-07-06 NOTE — Telephone Encounter (Signed)
 Pt is asking for new forms for eliquis and jardiance.

## 2023-08-17 ENCOUNTER — Encounter: Payer: Self-pay | Admitting: *Deleted

## 2023-08-17 ENCOUNTER — Encounter: Payer: Self-pay | Admitting: Cardiology

## 2023-08-17 ENCOUNTER — Ambulatory Visit: Payer: Medicare Other | Attending: Cardiology | Admitting: Cardiology

## 2023-08-17 VITALS — BP 126/78 | HR 98 | Ht 69.0 in | Wt 192.6 lb

## 2023-08-17 DIAGNOSIS — I5022 Chronic systolic (congestive) heart failure: Secondary | ICD-10-CM | POA: Diagnosis not present

## 2023-08-17 DIAGNOSIS — I10 Essential (primary) hypertension: Secondary | ICD-10-CM

## 2023-08-17 DIAGNOSIS — I4819 Other persistent atrial fibrillation: Secondary | ICD-10-CM | POA: Diagnosis not present

## 2023-08-17 NOTE — Progress Notes (Signed)
    Cardiology Office Note  Date: 08/17/2023   ID: [name], DOB Jun 03, 1936, MRN [id number]  History of Present Illness: [name] is an 88 y.o. male seen in August 2024.  He is here for a routine visit.  He does not report any exertional chest pain, NYHA class II-III dyspnea which is overall stable.  Has had some fluid retention in his legs.  I reviewed his medications.  Current regimen includes Eliquis, Jardiance, Cozaar, Toprol-XL, Lasix, and HCTZ.  He does not report any spontaneous bleeding problems.  We are requesting recent lab work from PCP for review.  We did discuss potentially either adding Aldactone or increasing his Lasix.  Physical Exam: VS:  BP 126/78   Pulse 98   Ht 5\' 9"  (1.753 m)   Wt 192 lb 9.6 oz (87.4 kg)   SpO2 99%   BMI 28.44 kg/m , BMI Body mass index is 28.44 kg/m.  Wt Readings from Last 3 Encounters:  08/17/23 192 lb 9.6 oz (87.4 kg)  02/04/23 188 lb 6.4 oz (85.5 kg)  07/31/22 188 lb 9.6 oz (85.5 kg)    General: Patient appears comfortable at rest. HEENT: Conjunctiva and lids normal. Neck: Supple, no elevated JVP or carotid bruits. Lungs: Clear to auscultation, nonlabored breathing at rest. Cardiac: Irregularly irregular, 1/6 systolic murmur, no gallop. Abdomen: Bowel sounds present. Extremities: 1-2+ lower leg edema.  ECG:  An ECG dated 02/04/2023 was personally reviewed today and demonstrated:  Atrial fibrillation with nonspecific T wave changes.  Labwork:  October 2023: Hemoglobin A1c 5.8%, hemoglobin 14.7, platelets 205, BUN 23, creatinine 0.98, potassium 4.3, AST 28, ALT 20, cholesterol 193, triglycerides 97, HDL 59, LDL 115, TSH 1.85   Other Studies Reviewed Today:  No interval cardiac testing for review today.  Assessment and Plan:  1.  HFmrEF, LVEF 40 to 45% by echocardiogram in May 2023 with suspected ischemic cardiomyopathy and plan for conservative management.  Continue Toprol-XL 50 mg daily, Cozaar 100 mg daily, Jardiance 10  mg daily, and Lasix 20 mg daily.  Requesting interval lab work for review.  May consider adding Aldactone next.   2.  Permanent atrial fibrillation with CHA2DS2-VASc score of 4.  Heart rate controlled on Toprol-XL 50 mg daily.  Continue Eliquis 5 mg twice daily for stroke prophylaxis.  He does not report any spontaneous bleeding problems.   3.  Primary hypertension.  Blood pressure control is adequate today.  No changes made to present regimen.  He is also on HCTZ 25 mg daily.  Disposition:  Follow up  6 months.  Signed, Jonelle Sidle, M.D., F.A.C.C. [facility] at [facility]

## 2023-08-17 NOTE — Patient Instructions (Signed)

## 2023-08-19 ENCOUNTER — Telehealth: Payer: Self-pay | Admitting: Cardiology

## 2023-08-19 MED ORDER — SPIRONOLACTONE 25 MG PO TABS: 12.5000 mg | ORAL_TABLET | Freq: Every day | ORAL | 3 refills | Status: AC

## 2023-08-19 NOTE — Telephone Encounter (Signed)
-----   Message from Nona Dell sent at 08/17/2023  4:17 PM EST ----- Results reviewed.  Recent lab work per PCP.  Creatinine 1.12 with normal GFR and potassium 3.9.  Suggest adding Aldactone 12.5 mg daily to current regimen.

## 2023-08-19 NOTE — Telephone Encounter (Signed)
 The patient has been notified of the result and verbalized understanding.  All questions (if any) were answered. Roseanne Reno, [facility] 08/19/2023 10:48 AM

## 2023-08-19 NOTE — Telephone Encounter (Signed)
 Patient said that he was returning a phone call from yesterday

## 2023-12-03 DIAGNOSIS — H353231 Exudative age-related macular degeneration, bilateral, with active choroidal neovascularization: Secondary | ICD-10-CM | POA: Diagnosis not present

## 2023-12-06 DIAGNOSIS — I10 Essential (primary) hypertension: Secondary | ICD-10-CM | POA: Diagnosis not present

## 2023-12-06 DIAGNOSIS — J449 Chronic obstructive pulmonary disease, unspecified: Secondary | ICD-10-CM | POA: Diagnosis not present

## 2023-12-06 DIAGNOSIS — E119 Type 2 diabetes mellitus without complications: Secondary | ICD-10-CM | POA: Diagnosis not present

## 2023-12-16 DIAGNOSIS — X32XXXD Exposure to sunlight, subsequent encounter: Secondary | ICD-10-CM | POA: Diagnosis not present

## 2023-12-16 DIAGNOSIS — L570 Actinic keratosis: Secondary | ICD-10-CM | POA: Diagnosis not present

## 2023-12-16 DIAGNOSIS — Z08 Encounter for follow-up examination after completed treatment for malignant neoplasm: Secondary | ICD-10-CM | POA: Diagnosis not present

## 2023-12-16 DIAGNOSIS — Z85828 Personal history of other malignant neoplasm of skin: Secondary | ICD-10-CM | POA: Diagnosis not present

## 2023-12-20 DIAGNOSIS — E119 Type 2 diabetes mellitus without complications: Secondary | ICD-10-CM | POA: Diagnosis not present

## 2023-12-20 DIAGNOSIS — I10 Essential (primary) hypertension: Secondary | ICD-10-CM | POA: Diagnosis not present

## 2023-12-20 DIAGNOSIS — J449 Chronic obstructive pulmonary disease, unspecified: Secondary | ICD-10-CM | POA: Diagnosis not present

## 2023-12-20 DIAGNOSIS — I480 Paroxysmal atrial fibrillation: Secondary | ICD-10-CM | POA: Diagnosis not present

## 2024-01-13 DIAGNOSIS — R350 Frequency of micturition: Secondary | ICD-10-CM | POA: Diagnosis not present

## 2024-01-14 DIAGNOSIS — E119 Type 2 diabetes mellitus without complications: Secondary | ICD-10-CM | POA: Diagnosis not present

## 2024-01-14 DIAGNOSIS — J449 Chronic obstructive pulmonary disease, unspecified: Secondary | ICD-10-CM | POA: Diagnosis not present

## 2024-01-14 DIAGNOSIS — I10 Essential (primary) hypertension: Secondary | ICD-10-CM | POA: Diagnosis not present

## 2024-01-19 DIAGNOSIS — H32 Chorioretinal disorders in diseases classified elsewhere: Secondary | ICD-10-CM | POA: Diagnosis not present

## 2024-01-19 DIAGNOSIS — H31013 Macula scars of posterior pole (postinflammatory) (post-traumatic), bilateral: Secondary | ICD-10-CM | POA: Diagnosis not present

## 2024-01-19 DIAGNOSIS — H43813 Vitreous degeneration, bilateral: Secondary | ICD-10-CM | POA: Diagnosis not present

## 2024-01-19 DIAGNOSIS — H353231 Exudative age-related macular degeneration, bilateral, with active choroidal neovascularization: Secondary | ICD-10-CM | POA: Diagnosis not present

## 2024-01-19 DIAGNOSIS — H353132 Nonexudative age-related macular degeneration, bilateral, intermediate dry stage: Secondary | ICD-10-CM | POA: Diagnosis not present

## 2024-01-20 DIAGNOSIS — I480 Paroxysmal atrial fibrillation: Secondary | ICD-10-CM | POA: Diagnosis not present

## 2024-01-20 DIAGNOSIS — E119 Type 2 diabetes mellitus without complications: Secondary | ICD-10-CM | POA: Diagnosis not present

## 2024-01-20 DIAGNOSIS — I10 Essential (primary) hypertension: Secondary | ICD-10-CM | POA: Diagnosis not present

## 2024-01-20 DIAGNOSIS — J449 Chronic obstructive pulmonary disease, unspecified: Secondary | ICD-10-CM | POA: Diagnosis not present

## 2024-02-10 DIAGNOSIS — C44629 Squamous cell carcinoma of skin of left upper limb, including shoulder: Secondary | ICD-10-CM | POA: Diagnosis not present

## 2024-02-18 DIAGNOSIS — E119 Type 2 diabetes mellitus without complications: Secondary | ICD-10-CM | POA: Diagnosis not present

## 2024-02-18 DIAGNOSIS — I480 Paroxysmal atrial fibrillation: Secondary | ICD-10-CM | POA: Diagnosis not present

## 2024-02-18 DIAGNOSIS — I10 Essential (primary) hypertension: Secondary | ICD-10-CM | POA: Diagnosis not present

## 2024-02-18 DIAGNOSIS — J449 Chronic obstructive pulmonary disease, unspecified: Secondary | ICD-10-CM | POA: Diagnosis not present

## 2024-03-08 ENCOUNTER — Encounter: Payer: Self-pay | Admitting: Cardiology

## 2024-03-08 ENCOUNTER — Ambulatory Visit: Attending: Cardiology | Admitting: Cardiology

## 2024-03-08 VITALS — BP 110/62 | HR 84 | Ht 70.0 in | Wt 188.8 lb

## 2024-03-08 DIAGNOSIS — I4819 Other persistent atrial fibrillation: Secondary | ICD-10-CM

## 2024-03-08 DIAGNOSIS — I10 Essential (primary) hypertension: Secondary | ICD-10-CM

## 2024-03-08 DIAGNOSIS — I5022 Chronic systolic (congestive) heart failure: Secondary | ICD-10-CM | POA: Diagnosis not present

## 2024-03-08 NOTE — Progress Notes (Signed)
    Cardiology Office Note  Date: 03/08/2024   ID: HOWARD BUNTE, DOB 05/07/1936, MRN [id number]  History of Present Illness: [name] is an 88 y.o. male last seen in February.  He is here for a follow-up visit.  Reports NYHA class II dyspnea with typical activities, tends to be fairly sedentary.  He has had abdominal fullness and lower leg edema with reported weight gain, although by our scales he is down 4 pounds.  He does not report any chest pain.  Intermittent sense of palpitations which is chronic.  Medications reviewed.  He reports compliance with current regimen.  I did talk with him about self adjusting Lasix  depending on weight gain and fluid retention.  He states that he urinates a lot on the current regimen.  I reviewed his ECG today which shows rate controlled atrial fibrillation with PVC.  Physical Exam: VS:  BP 110/62 (BP Location: Left Arm)   Pulse 84   Ht 5' 10 (1.778 m)   Wt 188 lb 12.8 oz (85.6 kg)   SpO2 93%   BMI 27.09 kg/m , BMI Body mass index is 27.09 kg/m.  Wt Readings from Last 3 Encounters:  03/08/24 188 lb 12.8 oz (85.6 kg)  08/17/23 192 lb 9.6 oz (87.4 kg)  02/04/23 188 lb 6.4 oz (85.5 kg)    General: Patient appears comfortable at rest. HEENT: Conjunctiva and lids normal. Neck: Supple, no elevated JVP. Lungs: Clear to auscultation, nonlabored breathing at rest. Cardiac: Irregularly irregular without gallop. Abdomen: Protuberant, bowel sounds present, nontender. Extremities: 2+ edema with venous stasis.  ECG:  An ECG dated 02/04/2023 was personally reviewed today and demonstrated:  Atrial fibrillation with nonspecific T wave changes.  Labwork:  October 2024: Creatinine 1.12, GFR 64, potassium 3.9, AST 14, ALT 12  Other Studies Reviewed Today:  No interval cardiac testing for review today.  Assessment and Plan:  1.  HFmrEF, LVEF 40 to 45% by echocardiogram in May 2023 with suspected ischemic cardiomyopathy and plan for conservative  management.  For now plan to continue Jardiance  10 mg daily, Cozaar  100 mg daily, Toprol -XL 50 mg daily, Aldactone  12.5 mg daily, and Lasix  40 mg daily with increased dosing as needed for weight gain and fluid retention.   2.  Permanent atrial fibrillation with CHA2DS2-VASc score of 4.  Heart rate controlled on Toprol -XL 50 mg daily.  Continue Eliquis  5 mg twice daily for stroke prophylaxis.  He does not report any spontaneous bleeding problems.   3.  Primary hypertension.  Blood pressure well-controlled on current regimen.  Disposition:  Follow up 6 months.  Signed, Jayson JUDITHANN Sierras, M.D., F.A.C.C. [facility] at [facility]

## 2024-03-08 NOTE — Patient Instructions (Signed)

## 2024-03-09 DIAGNOSIS — H32 Chorioretinal disorders in diseases classified elsewhere: Secondary | ICD-10-CM | POA: Diagnosis not present

## 2024-03-09 DIAGNOSIS — H43813 Vitreous degeneration, bilateral: Secondary | ICD-10-CM | POA: Diagnosis not present

## 2024-03-09 DIAGNOSIS — H31013 Macula scars of posterior pole (postinflammatory) (post-traumatic), bilateral: Secondary | ICD-10-CM | POA: Diagnosis not present

## 2024-03-09 DIAGNOSIS — H353231 Exudative age-related macular degeneration, bilateral, with active choroidal neovascularization: Secondary | ICD-10-CM | POA: Diagnosis not present

## 2024-03-09 DIAGNOSIS — H353132 Nonexudative age-related macular degeneration, bilateral, intermediate dry stage: Secondary | ICD-10-CM | POA: Diagnosis not present

## 2024-03-13 DIAGNOSIS — C4442 Squamous cell carcinoma of skin of scalp and neck: Secondary | ICD-10-CM | POA: Diagnosis not present

## 2024-03-13 DIAGNOSIS — Z08 Encounter for follow-up examination after completed treatment for malignant neoplasm: Secondary | ICD-10-CM | POA: Diagnosis not present

## 2024-03-13 DIAGNOSIS — L570 Actinic keratosis: Secondary | ICD-10-CM | POA: Diagnosis not present

## 2024-03-13 DIAGNOSIS — X32XXXD Exposure to sunlight, subsequent encounter: Secondary | ICD-10-CM | POA: Diagnosis not present

## 2024-03-13 DIAGNOSIS — Z85828 Personal history of other malignant neoplasm of skin: Secondary | ICD-10-CM | POA: Diagnosis not present

## 2024-03-21 DIAGNOSIS — E119 Type 2 diabetes mellitus without complications: Secondary | ICD-10-CM | POA: Diagnosis not present

## 2024-03-21 DIAGNOSIS — I10 Essential (primary) hypertension: Secondary | ICD-10-CM | POA: Diagnosis not present

## 2024-03-21 DIAGNOSIS — I480 Paroxysmal atrial fibrillation: Secondary | ICD-10-CM | POA: Diagnosis not present

## 2024-03-21 DIAGNOSIS — J449 Chronic obstructive pulmonary disease, unspecified: Secondary | ICD-10-CM | POA: Diagnosis not present

## 2024-06-27 ENCOUNTER — Ambulatory Visit (HOSPITAL_COMMUNITY)
Admission: RE | Admit: 2024-06-27 | Discharge: 2024-06-27 | Disposition: A | Discharge status: Home / Self Care | Source: Ambulatory Visit

## 2024-06-27 ENCOUNTER — Other Ambulatory Visit (HOSPITAL_COMMUNITY): Payer: Self-pay

## 2024-06-27 DIAGNOSIS — M546 Pain in thoracic spine: Secondary | ICD-10-CM | POA: Insufficient documentation

## 2024-06-27 DIAGNOSIS — M47812 Spondylosis without myelopathy or radiculopathy, cervical region: Secondary | ICD-10-CM | POA: Diagnosis present

## 2024-07-18 ENCOUNTER — Encounter (HOSPITAL_COMMUNITY): Payer: Self-pay | Admitting: Physician Assistant

## 2024-07-19 ENCOUNTER — Other Ambulatory Visit (HOSPITAL_COMMUNITY): Payer: Self-pay | Admitting: Physician Assistant

## 2024-07-19 DIAGNOSIS — N644 Mastodynia: Secondary | ICD-10-CM

## 2024-08-01 ENCOUNTER — Ambulatory Visit (HOSPITAL_COMMUNITY)

## 2024-08-03 ENCOUNTER — Ambulatory Visit (HOSPITAL_COMMUNITY)

## 2024-08-03 ENCOUNTER — Encounter (HOSPITAL_COMMUNITY)

## 2024-09-05 ENCOUNTER — Ambulatory Visit: Admitting: Cardiology
